# Patient Record
Sex: Female | Born: 1950 | Race: Black or African American | Hispanic: No | Marital: Single | Smoking: Never smoker
Health system: Southern US, Community
[De-identification: ages and names within clinical notes are randomized; demographics above are authoritative.]

## PROBLEM LIST (undated history)

## (undated) DIAGNOSIS — I10 Essential (primary) hypertension: Secondary | ICD-10-CM

## (undated) DIAGNOSIS — D649 Anemia, unspecified: Secondary | ICD-10-CM

## (undated) DIAGNOSIS — I429 Cardiomyopathy, unspecified: Secondary | ICD-10-CM

## (undated) DIAGNOSIS — I2510 Atherosclerotic heart disease of native coronary artery without angina pectoris: Secondary | ICD-10-CM

## (undated) DIAGNOSIS — R569 Unspecified convulsions: Secondary | ICD-10-CM

## (undated) DIAGNOSIS — J9690 Respiratory failure, unspecified, unspecified whether with hypoxia or hypercapnia: Secondary | ICD-10-CM

## (undated) DIAGNOSIS — H547 Unspecified visual loss: Secondary | ICD-10-CM

## (undated) DIAGNOSIS — F329 Major depressive disorder, single episode, unspecified: Secondary | ICD-10-CM

## (undated) DIAGNOSIS — G8190 Hemiplegia, unspecified affecting unspecified side: Secondary | ICD-10-CM

## (undated) DIAGNOSIS — F32A Depression, unspecified: Secondary | ICD-10-CM

## (undated) DIAGNOSIS — G360 Neuromyelitis optica [Devic]: Secondary | ICD-10-CM

## (undated) DIAGNOSIS — Z9911 Dependence on respirator [ventilator] status: Secondary | ICD-10-CM

## (undated) HISTORY — PX: TRACHEOSTOMY: SUR1362

---

## 2013-09-12 ENCOUNTER — Emergency Department (HOSPITAL_COMMUNITY): Payer: Medicare Other

## 2013-09-12 ENCOUNTER — Encounter (HOSPITAL_COMMUNITY): Payer: Self-pay | Admitting: Emergency Medicine

## 2013-09-12 ENCOUNTER — Inpatient Hospital Stay (HOSPITAL_COMMUNITY): Payer: Medicare Other

## 2013-09-12 ENCOUNTER — Inpatient Hospital Stay (HOSPITAL_COMMUNITY)
Admission: EM | Admit: 2013-09-12 | Discharge: 2013-09-17 | DRG: 853 | Disposition: A | Discharge status: Other Acute Inpatient Hospital | Payer: Medicare Other | Attending: Internal Medicine | Admitting: Internal Medicine

## 2013-09-12 DIAGNOSIS — F411 Generalized anxiety disorder: Secondary | ICD-10-CM | POA: Diagnosis present

## 2013-09-12 DIAGNOSIS — I69959 Hemiplegia and hemiparesis following unspecified cerebrovascular disease affecting unspecified side: Secondary | ICD-10-CM

## 2013-09-12 DIAGNOSIS — K921 Melena: Secondary | ICD-10-CM

## 2013-09-12 DIAGNOSIS — F329 Major depressive disorder, single episode, unspecified: Secondary | ICD-10-CM | POA: Diagnosis present

## 2013-09-12 DIAGNOSIS — Z1635 Resistance to multiple antimicrobial drugs: Secondary | ICD-10-CM

## 2013-09-12 DIAGNOSIS — D638 Anemia in other chronic diseases classified elsewhere: Secondary | ICD-10-CM | POA: Diagnosis present

## 2013-09-12 DIAGNOSIS — L8991 Pressure ulcer of unspecified site, stage 1: Secondary | ICD-10-CM | POA: Diagnosis present

## 2013-09-12 DIAGNOSIS — N390 Urinary tract infection, site not specified: Secondary | ICD-10-CM | POA: Diagnosis present

## 2013-09-12 DIAGNOSIS — N184 Chronic kidney disease, stage 4 (severe): Secondary | ICD-10-CM | POA: Diagnosis present

## 2013-09-12 DIAGNOSIS — E872 Acidosis, unspecified: Secondary | ICD-10-CM | POA: Diagnosis present

## 2013-09-12 DIAGNOSIS — N189 Chronic kidney disease, unspecified: Secondary | ICD-10-CM

## 2013-09-12 DIAGNOSIS — N201 Calculus of ureter: Secondary | ICD-10-CM | POA: Diagnosis present

## 2013-09-12 DIAGNOSIS — Z841 Family history of disorders of kidney and ureter: Secondary | ICD-10-CM

## 2013-09-12 DIAGNOSIS — F3289 Other specified depressive episodes: Secondary | ICD-10-CM | POA: Diagnosis present

## 2013-09-12 DIAGNOSIS — M7989 Other specified soft tissue disorders: Secondary | ICD-10-CM | POA: Diagnosis present

## 2013-09-12 DIAGNOSIS — I129 Hypertensive chronic kidney disease with stage 1 through stage 4 chronic kidney disease, or unspecified chronic kidney disease: Secondary | ICD-10-CM | POA: Diagnosis present

## 2013-09-12 DIAGNOSIS — N139 Obstructive and reflux uropathy, unspecified: Secondary | ICD-10-CM | POA: Diagnosis present

## 2013-09-12 DIAGNOSIS — Z9911 Dependence on respirator [ventilator] status: Secondary | ICD-10-CM

## 2013-09-12 DIAGNOSIS — H543 Unqualified visual loss, both eyes: Secondary | ICD-10-CM | POA: Diagnosis present

## 2013-09-12 DIAGNOSIS — N200 Calculus of kidney: Secondary | ICD-10-CM | POA: Diagnosis present

## 2013-09-12 DIAGNOSIS — B965 Pseudomonas (aeruginosa) (mallei) (pseudomallei) as the cause of diseases classified elsewhere: Secondary | ICD-10-CM | POA: Diagnosis present

## 2013-09-12 DIAGNOSIS — IMO0002 Reserved for concepts with insufficient information to code with codable children: Secondary | ICD-10-CM | POA: Diagnosis present

## 2013-09-12 DIAGNOSIS — R569 Unspecified convulsions: Secondary | ICD-10-CM | POA: Diagnosis present

## 2013-09-12 DIAGNOSIS — M2450 Contracture, unspecified joint: Secondary | ICD-10-CM | POA: Diagnosis present

## 2013-09-12 DIAGNOSIS — E669 Obesity, unspecified: Secondary | ICD-10-CM | POA: Diagnosis present

## 2013-09-12 DIAGNOSIS — Z888 Allergy status to other drugs, medicaments and biological substances status: Secondary | ICD-10-CM

## 2013-09-12 DIAGNOSIS — Z6826 Body mass index (BMI) 26.0-26.9, adult: Secondary | ICD-10-CM | POA: Diagnosis not present

## 2013-09-12 DIAGNOSIS — N179 Acute kidney failure, unspecified: Secondary | ICD-10-CM

## 2013-09-12 DIAGNOSIS — G360 Neuromyelitis optica [Devic]: Secondary | ICD-10-CM | POA: Diagnosis present

## 2013-09-12 DIAGNOSIS — J189 Pneumonia, unspecified organism: Secondary | ICD-10-CM

## 2013-09-12 DIAGNOSIS — Z833 Family history of diabetes mellitus: Secondary | ICD-10-CM

## 2013-09-12 DIAGNOSIS — G35 Multiple sclerosis: Secondary | ICD-10-CM | POA: Diagnosis present

## 2013-09-12 DIAGNOSIS — Z930 Tracheostomy status: Secondary | ICD-10-CM

## 2013-09-12 DIAGNOSIS — A419 Sepsis, unspecified organism: Principal | ICD-10-CM | POA: Diagnosis present

## 2013-09-12 DIAGNOSIS — J9620 Acute and chronic respiratory failure, unspecified whether with hypoxia or hypercapnia: Secondary | ICD-10-CM

## 2013-09-12 DIAGNOSIS — I2510 Atherosclerotic heart disease of native coronary artery without angina pectoris: Secondary | ICD-10-CM | POA: Diagnosis present

## 2013-09-12 DIAGNOSIS — N1330 Unspecified hydronephrosis: Secondary | ICD-10-CM

## 2013-09-12 DIAGNOSIS — I69998 Other sequelae following unspecified cerebrovascular disease: Secondary | ICD-10-CM

## 2013-09-12 DIAGNOSIS — D6489 Other specified anemias: Secondary | ICD-10-CM

## 2013-09-12 DIAGNOSIS — R6520 Severe sepsis without septic shock: Secondary | ICD-10-CM

## 2013-09-12 DIAGNOSIS — E8779 Other fluid overload: Secondary | ICD-10-CM | POA: Diagnosis not present

## 2013-09-12 DIAGNOSIS — L89109 Pressure ulcer of unspecified part of back, unspecified stage: Secondary | ICD-10-CM | POA: Diagnosis present

## 2013-09-12 DIAGNOSIS — I428 Other cardiomyopathies: Secondary | ICD-10-CM | POA: Diagnosis present

## 2013-09-12 DIAGNOSIS — J9621 Acute and chronic respiratory failure with hypoxia: Secondary | ICD-10-CM

## 2013-09-12 DIAGNOSIS — H109 Unspecified conjunctivitis: Secondary | ICD-10-CM | POA: Diagnosis not present

## 2013-09-12 HISTORY — DX: Depression, unspecified: F32.A

## 2013-09-12 HISTORY — DX: Essential (primary) hypertension: I10

## 2013-09-12 HISTORY — DX: Cardiomyopathy, unspecified: I42.9

## 2013-09-12 HISTORY — DX: Hemiplegia, unspecified affecting unspecified side: G81.90

## 2013-09-12 HISTORY — DX: Respiratory failure, unspecified, unspecified whether with hypoxia or hypercapnia: J96.90

## 2013-09-12 HISTORY — DX: Atherosclerotic heart disease of native coronary artery without angina pectoris: I25.10

## 2013-09-12 HISTORY — DX: Dependence on respirator (ventilator) status: Z99.11

## 2013-09-12 HISTORY — DX: Anemia, unspecified: D64.9

## 2013-09-12 HISTORY — DX: Major depressive disorder, single episode, unspecified: F32.9

## 2013-09-12 HISTORY — DX: Neuromyelitis optica (devic): G36.0

## 2013-09-12 HISTORY — DX: Unspecified convulsions: R56.9

## 2013-09-12 HISTORY — DX: Unspecified visual loss: H54.7

## 2013-09-12 LAB — CBC WITH DIFFERENTIAL/PLATELET
BASOS ABS: 0.1 10*3/uL (ref 0.0–0.1)
Basophils Relative: 1 % (ref 0–1)
EOS ABS: 0.5 10*3/uL (ref 0.0–0.7)
EOS PCT: 7 % — AB (ref 0–5)
HCT: 22.2 % — ABNORMAL LOW (ref 36.0–46.0)
Hemoglobin: 7.3 g/dL — ABNORMAL LOW (ref 12.0–15.0)
LYMPHS PCT: 17 % (ref 12–46)
Lymphs Abs: 1.2 10*3/uL (ref 0.7–4.0)
MCH: 31.2 pg (ref 26.0–34.0)
MCHC: 32.9 g/dL (ref 30.0–36.0)
MCV: 94.9 fL (ref 78.0–100.0)
Monocytes Absolute: 0.3 10*3/uL (ref 0.1–1.0)
Monocytes Relative: 4 % (ref 3–12)
NEUTROS PCT: 71 % (ref 43–77)
Neutro Abs: 4.9 10*3/uL (ref 1.7–7.7)
PLATELETS: 154 10*3/uL (ref 150–400)
RBC: 2.34 MIL/uL — ABNORMAL LOW (ref 3.87–5.11)
RDW: 15.3 % (ref 11.5–15.5)
WBC: 6.9 10*3/uL (ref 4.0–10.5)

## 2013-09-12 LAB — COMPREHENSIVE METABOLIC PANEL
ALT: 7 U/L (ref 0–35)
AST: 11 U/L (ref 0–37)
Albumin: 1.9 g/dL — ABNORMAL LOW (ref 3.5–5.2)
Alkaline Phosphatase: 85 U/L (ref 39–117)
BUN: 47 mg/dL — ABNORMAL HIGH (ref 6–23)
CALCIUM: 7.7 mg/dL — AB (ref 8.4–10.5)
CHLORIDE: 111 meq/L (ref 96–112)
Creatinine, Ser: 4.15 mg/dL — ABNORMAL HIGH (ref 0.50–1.10)
GFR calc Af Amer: 12 mL/min — ABNORMAL LOW (ref >90)
GFR, EST NON AFRICAN AMERICAN: 11 mL/min — AB (ref >90)
Glucose, Bld: 78 mg/dL (ref 70–99)
Potassium: 4.1 mEq/L (ref 3.7–5.3)
SODIUM: 136 meq/L — AB (ref 137–147)
Total Bilirubin: <0.2 mg/dL — ABNORMAL LOW (ref 0.3–1.2)
Total Protein: 5.5 g/dL — ABNORMAL LOW (ref 6.0–8.3)

## 2013-09-12 LAB — I-STAT ARTERIAL BLOOD GAS, ED
Acid-base deficit: 16 mmol/L — ABNORMAL HIGH (ref 0.0–2.0)
Bicarbonate: 10.8 mEq/L — ABNORMAL LOW (ref 20.0–24.0)
O2 Saturation: 99 %
PO2 ART: 133 mmHg — AB (ref 80.0–100.0)
Patient temperature: 96.3
TCO2: 12 mmol/L (ref 0–100)
pCO2 arterial: 26.4 mmHg — ABNORMAL LOW (ref 35.0–45.0)
pH, Arterial: 7.213 — ABNORMAL LOW (ref 7.350–7.450)

## 2013-09-12 LAB — URINALYSIS, ROUTINE W REFLEX MICROSCOPIC
Bilirubin Urine: NEGATIVE
GLUCOSE, UA: NEGATIVE mg/dL
Hgb urine dipstick: NEGATIVE
KETONES UR: NEGATIVE mg/dL
Nitrite: POSITIVE — AB
Protein, ur: >300 mg/dL — AB
Specific Gravity, Urine: 1.019 (ref 1.005–1.030)
Urobilinogen, UA: 0.2 mg/dL (ref 0.0–1.0)
pH: 5.5 (ref 5.0–8.0)

## 2013-09-12 LAB — URINE MICROSCOPIC-ADD ON

## 2013-09-12 LAB — TSH: TSH: 2.64 u[IU]/mL (ref 0.350–4.500)

## 2013-09-12 LAB — I-STAT CG4 LACTIC ACID, ED: Lactic Acid, Venous: <0.3 mmol/L — ABNORMAL LOW (ref 0.5–2.2)

## 2013-09-12 LAB — PREPARE RBC (CROSSMATCH)

## 2013-09-12 LAB — POC OCCULT BLOOD, ED: Fecal Occult Bld: POSITIVE — AB

## 2013-09-12 MED ORDER — ALBUTEROL SULFATE (2.5 MG/3ML) 0.083% IN NEBU: 2.5000 mg | INHALATION_SOLUTION | RESPIRATORY_TRACT | Status: DC | PRN

## 2013-09-12 MED ORDER — SODIUM BICARBONATE 8.4 % IV SOLN
50.0000 meq | Freq: Once | INTRAVENOUS | Status: AC
  Administered 2013-09-12: 50 meq via INTRAVENOUS
  Filled 2013-09-12: qty 50

## 2013-09-12 MED ORDER — PANTOPRAZOLE SODIUM 40 MG IV SOLR
40.0000 mg | Freq: Every day | INTRAVENOUS | Status: DC
  Administered 2013-09-13: 40 mg via INTRAVENOUS
  Filled 2013-09-12 (×2): qty 40

## 2013-09-12 MED ORDER — CEFTRIAXONE SODIUM 1 G IJ SOLR
1.0000 g | INTRAMUSCULAR | Status: DC
  Administered 2013-09-13 – 2013-09-16 (×4): 1 g via INTRAVENOUS
  Filled 2013-09-12 (×4): qty 10

## 2013-09-12 MED ORDER — SODIUM CHLORIDE 0.9 % IV SOLN
80.0000 mg | Freq: Once | INTRAVENOUS | Status: AC
  Administered 2013-09-12: 80 mg via INTRAVENOUS
  Filled 2013-09-12: qty 80

## 2013-09-12 MED ORDER — HYDRALAZINE HCL 20 MG/ML IJ SOLN
10.0000 mg | INTRAMUSCULAR | Status: DC | PRN
  Administered 2013-09-14 – 2013-09-16 (×5): 10 mg via INTRAVENOUS
  Filled 2013-09-12 (×5): qty 1

## 2013-09-12 MED ORDER — SODIUM CHLORIDE 0.9 % IV SOLN
250.0000 mL | INTRAVENOUS | Status: DC | PRN
  Administered 2013-09-15: 250 mL via INTRAVENOUS

## 2013-09-12 MED ORDER — AMLODIPINE BESYLATE 5 MG PO TABS
5.0000 mg | ORAL_TABLET | Freq: Every day | ORAL | Status: DC
  Administered 2013-09-13: 5 mg via ORAL
  Filled 2013-09-12 (×2): qty 1

## 2013-09-12 MED ORDER — PIPERACILLIN-TAZOBACTAM 3.375 G IVPB 30 MIN
3.3750 g | Freq: Once | INTRAVENOUS | Status: AC
  Administered 2013-09-12: 3.375 g via INTRAVENOUS
  Filled 2013-09-12: qty 50

## 2013-09-12 MED ORDER — HYDRALAZINE HCL 50 MG PO TABS
100.0000 mg | ORAL_TABLET | Freq: Three times a day (TID) | ORAL | Status: DC
  Administered 2013-09-13 – 2013-09-17 (×13): 100 mg via ORAL
  Filled 2013-09-12 (×17): qty 2

## 2013-09-12 MED ORDER — LABETALOL HCL 300 MG PO TABS
300.0000 mg | ORAL_TABLET | Freq: Three times a day (TID) | ORAL | Status: DC
  Administered 2013-09-13 – 2013-09-17 (×13): 300 mg via ORAL
  Filled 2013-09-12 (×17): qty 1

## 2013-09-12 MED ORDER — SODIUM CHLORIDE 0.9 % IV SOLN
10.0000 mL/h | Freq: Once | INTRAVENOUS | Status: AC
  Administered 2013-09-12: 10 mL/h via INTRAVENOUS

## 2013-09-12 MED ORDER — SODIUM CHLORIDE 0.9 % IV SOLN
2000.0000 mg | Freq: Once | INTRAVENOUS | Status: AC
  Administered 2013-09-12: 2000 mg via INTRAVENOUS
  Filled 2013-09-12: qty 2000

## 2013-09-12 MED ORDER — ONDANSETRON HCL 4 MG/2ML IJ SOLN
4.0000 mg | Freq: Four times a day (QID) | INTRAMUSCULAR | Status: DC | PRN
  Administered 2013-09-14 – 2013-09-17 (×4): 4 mg via INTRAVENOUS
  Filled 2013-09-12 (×5): qty 2

## 2013-09-12 MED ORDER — SODIUM CHLORIDE 0.9 % IV BOLUS (SEPSIS)
1000.0000 mL | Freq: Once | INTRAVENOUS | Status: AC
  Administered 2013-09-12: 1000 mL via INTRAVENOUS

## 2013-09-12 MED ORDER — DOCUSATE SODIUM 100 MG PO CAPS
100.0000 mg | ORAL_CAPSULE | Freq: Two times a day (BID) | ORAL | Status: DC
  Administered 2013-09-13 – 2013-09-17 (×7): 100 mg via ORAL
  Filled 2013-09-12 (×11): qty 1

## 2013-09-12 MED ORDER — TIZANIDINE HCL 2 MG PO TABS
2.0000 mg | ORAL_TABLET | Freq: Three times a day (TID) | ORAL | Status: DC
  Administered 2013-09-13 – 2013-09-17 (×14): 2 mg via ORAL
  Filled 2013-09-12 (×17): qty 1

## 2013-09-12 MED ORDER — SODIUM BICARBONATE 8.4 % IV SOLN
INTRAVENOUS | Status: DC
  Administered 2013-09-12 – 2013-09-14 (×4): via INTRAVENOUS
  Filled 2013-09-12 (×5): qty 150

## 2013-09-12 NOTE — Consult Note (Addendum)
[name] is an 63 y.o. 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  Last labs that are available form 5/15 show Scr 1.6 and bicarb 23.  Med list not not reveal any meds that are nephrotoxic.  She had a foley in place until 1 month ago.  She says she makes urine.  Past Medical History  Diagnosis Date  . Neuromyelitis optica   . Blindness   . Hemiparesis     left sided  . Hypertension   . Cardiomyopathy   . Coronary artery disease   . Ventilator dependent   . Respiratory failure   . Depression   . Anemia   . Seizures     Past Surgical History  Procedure Laterality Date  . Tracheostomy      No family history on file. FH + for ESRD in mother sec DM Social History:  reports that she has never smoked. She does not have any smokeless tobacco history on file. She reports that she does not drink alcohol or use illicit drugs. Long term resident of [facility]  Allergies:  Allergies  Allergen Reactions  . Neurontin [Gabapentin] Other (See Comments)    Per MAR, unknown  . Xanax [Alprazolam] Other (See Comments)    Per MAR, unknown     (Not in a hospital admission)   Lab Results: UA: > 300 protein, 21-50 wbc's, 0-2 rbc many bacteria   Recent Labs  09/12/13 1750  WBC 6.9  HGB 7.3*  HCT 22.2*  PLT 154   BMET  Recent Labs  09/12/13 1750  NA 136*  K 4.1  CL 111  CO2 <7*  GLUCOSE 78  BUN 47*  CREATININE 4.15*  CALCIUM 7.7*   LFT  Recent Labs  09/12/13 1750  PROT 5.5*  ALBUMIN 1.9*  AST 11  ALT 7  ALKPHOS 85  BILITOT <0.2*   Dg Chest Portable 1 View  09/12/2013   CLINICAL DATA:  PICC placement.  EXAM: PORTABLE CHEST - 1 VIEW  COMPARISON:  12/02/2011.  FINDINGS: LEFT lower lobe atelectasis, effusion, and possible consolidation. Tracheostomy.  Cardiomegaly.  The PICC line from RIGHT arm approach has its tip at the cavoatrial junction in good position. No pneumothorax. RIGHT lung clear.  Worsening aeration from priors.  IMPRESSION: PICC line from RIGHT arm approach lies with its tip at the cavoatrial junction. LEFT lower lobe process as described.   Electronically Signed   By: Davonna Belling M.D.   On: 09/12/2013 17:14    ROS: Pt trached but can nod yes or no Blind No SOB No CP No dysuria No abd pain She does eat and appetite has been fair   PHYSICAL EXAM: Blood pressure 180/78, pulse 69, temperature 99.6 F (37.6 C), temperature source Temporal, resp. rate 24, SpO2 100.00%. HEENT: Blind. Purulent drainage Rt eye.  Trach with indurated area around trach NECK:No JVD LUNGS:Decreased BS Lt base CARDIAC:RRR 2/6 systolic M LSB/apex ABD:+ BS NT + suprapubic fullness EXT:1-2+ edema of legs and arms.  PICC line Rt arm NEURO:Arms are contracted with Lt hemiparesis.  She can raise her legs off the bed. Follows commands. No asterixis  Assessment: 1. Acute on CKD 3 ? Partial urinary obstruction 2. Metabolic acidosis, high AG and non AG 3. VDRF 4. Neuromyelitis  Optica 5. Anemia 6. Hx lt psoas abscess 7. ? UTI  PLAN: 1. Bicarb gtt 2. Foley cath 3. Renal US 4. Check PO4, Pr/Cr 5. Resume aranep 6.  Discussed with pt and family that if renal fx were to worsen that HD should not be done and we should treat what we can treat medically.  They agree. 7. DAily Scr 8 Empiric AB pending cultures   [name] T 09/12/2013, 9:57 PM

## 2013-09-12 NOTE — H&P (Signed)
PULMONARY / CRITICAL CARE MEDICINE   Name: [name] MRN: [id number] DOB: August 09, 1950    ADMISSION DATE:  09/12/2013  REFERRING MD :  Dr. Silverio Lay in ED  CHIEF COMPLAINT:  Abnormal labs  INITIAL PRESENTATION:   STUDIES:    SIGNIFICANT EVENTS:    HISTORY OF PRESENT ILLNESS:  This is a complicated 63 year old AAF with PMH of neuromyelitis optica with blindness hemiparesis on the left side, HTN, cardiomyopathy, CAD, anemia of chronic illness, anxiety and depression, and tracheostomy w/ vent dependence. She lives in [facility] for the past 4 years. She was sent here due to abnormal labs reviewed patient had a bump in her creatinine up to 4.5 (last known 1.6 on MAy 2015). She also had decreased CO2 levels at 10. Hemoglobin 7.5 with positive stool guaiac. Apparently she also has generalized edema and was sent here for evaluation. In the ED she has findings consistent with UTI, also has a renal failure with severe metabolic acidosis with resp compensation. She wears a diaper usually. Also known to have sacral decubitus ulcer   PAST MEDICAL HISTORY :  Past Medical History  Diagnosis Date  . Neuromyelitis optica   . Blindness   . Hemiparesis     left sided  . Hypertension   . Cardiomyopathy   . Coronary artery disease   . Ventilator dependent   . Respiratory failure   . Depression   . Anemia   . Seizures    Past Surgical History  Procedure Laterality Date  . Tracheostomy     Prior to Admission medications   Medication Sig Start Date End Date Taking? Authorizing Provider  acetaminophen (TYLENOL) 325 MG tablet Take 650 mg by mouth every 6 (six) hours as needed for moderate pain.   Yes Historical Provider, MD  amLODipine (NORVASC) 10 MG tablet Take 10 mg by mouth daily.   Yes Historical Provider, MD  chlorhexidine (PERIDEX) 0.12 % solution Use as directed 15 mLs in the mouth or throat 2 (two) times daily.   Yes Historical Provider, MD  cloNIDine (CATAPRES) 0.1 MG tablet Take 0.1 mg by  mouth every 6 (six) hours as needed (for hypertension for systolic blood pressure greater than 160).   Yes Historical Provider, MD  darbepoetin (ARANESP) 40 MCG/0.4ML SOLN injection Inject 40 mcg into the skin every 7 (seven) days. On Monday   Yes Historical Provider, MD  DULoxetine (CYMBALTA) 30 MG capsule Take 30 mg by mouth daily.   Yes Historical Provider, MD  ferrous sulfate 325 (65 FE) MG tablet Take 325 mg by mouth 2 (two) times daily.   Yes Historical Provider, MD  hydrALAZINE (APRESOLINE) 100 MG tablet Take 100 mg by mouth 3 (three) times daily.   Yes Historical Provider, MD  labetalol (NORMODYNE) 300 MG tablet Take 300 mg by mouth 3 (three) times daily.   Yes Historical Provider, MD  levETIRAcetam (KEPPRA) 250 MG tablet Take 250 mg by mouth at bedtime.   Yes Historical Provider, MD  levETIRAcetam (KEPPRA) 500 MG tablet Take 500 mg by mouth daily.   Yes Historical Provider, MD  levofloxacin (LEVAQUIN) 500 MG/100ML SOLN Inject 500 mg into the vein every other day. 09/02/13  Yes Historical Provider, MD  magnesium hydroxide (MILK OF MAGNESIA) 400 MG/5ML suspension Take 30 mLs by mouth every 8 (eight) hours as needed (for nausea or vomiting).   Yes Historical Provider, MD  Multiple Vitamins-Minerals (MULTIVITAMIN & MINERAL PO) Take 1 tablet by mouth daily.   Yes Historical Provider, MD  mupirocin ointment (  BACTROBAN) 2 % Place 1 application into the nose as needed (for right toe infection).   Yes Historical Provider, MD  omeprazole (PRILOSEC) 20 MG capsule Take 20 mg by mouth daily.   Yes Historical Provider, MD  promethazine (PHENERGAN) 25 MG tablet Take 12.5 mg by mouth every 6 (six) hours as needed for nausea or vomiting.   Yes Historical Provider, MD  simethicone (MYLICON) 80 MG chewable tablet Chew 80 mg by mouth every 6 (six) hours as needed for flatulence.   Yes Historical Provider, MD  tiZANidine (ZANAFLEX) 2 MG tablet Take 2 mg by mouth 3 (three) times daily.   Yes Historical Provider, MD    Allergies  Allergen Reactions  . Neurontin [Gabapentin] Other (See Comments)    Per MAR, unknown  . Xanax [Alprazolam] Other (See Comments)    Per MAR, unknown    FAMILY HISTORY:  No family history on file. SOCIAL HISTORY:  reports that she has never smoked. She does not have any smokeless tobacco history on file. She reports that she does not drink alcohol or use illicit drugs.  REVIEW OF SYSTEMS:  Has nausea. She is blind and with contractures. No chest pain, no dyspnea, no abd pain, no diarrhea, no fever, no headaches, no new rash, no neck pain.  SUBJECTIVE:   VITAL SIGNS: Temp:  [96.3 F (35.7 C)-99.6 F (37.6 C)] 99.6 F (37.6 C) (08/08 1839) Pulse Rate:  [70-97] 72 (08/08 2100) Resp:  [22-24] 24 (08/08 1940) BP: (143-171)/(60-77) 171/60 mmHg (08/08 2100) SpO2:  [100 %] 100 % (08/08 2100) FiO2 (%):  [30 %] 30 % (08/08 1940) HEMODYNAMICS:   VENTILATOR SETTINGS: Vent Mode:  [-] PRVC FiO2 (%):  [30 %] 30 % Set Rate:  [22 bmp] 22 bmp Vt Set:  [500 mL] 500 mL PEEP:  [5 cmH20] 5 cmH20 Plateau Pressure:  [19 cmH20-24 cmH20] 24 cmH20 INTAKE / OUTPUT:  Intake/Output Summary (Last 24 hours) at 09/12/13 2131 Last data filed at 09/12/13 2104  Gross per 24 hour  Intake    100 ml  Output      0 ml  Net    100 ml    PHYSICAL EXAMINATION: General:  Awake, alert, oriented, mouthing off words appropirately Neuro:  Able to move her legs but restricted due to contractures, no facial droop, tongue midline HEENT:  Atraumatic, trache site looks clean, has chronic skin changes around the trache, eyes open Cardiovascular:  RRR, 3/6 systolic murmur Lungs:  Fair air movement, no wheeze, mild rhonchi Abdomen: soft, nontender, no guarding Musculoskeletal:  Has flexion contractures on both arms, has extensor contractures on both legs, mild edema on her arms, mild edema on the face Skin:  (+) sacral decubitus ulcer St. 1  LABS:  CBC  Recent Labs Lab 09/12/13 1750  WBC 6.9   HGB 7.3*  HCT 22.2*  PLT 154   Coag's No results found for this basename: APTT, INR,  in the last 168 hours BMET  Recent Labs Lab 09/12/13 1750  NA 136*  K 4.1  CL 111  CO2 <7*  BUN 47*  CREATININE 4.15*  GLUCOSE 78   Electrolytes  Recent Labs Lab 09/12/13 1750  CALCIUM 7.7*   Sepsis Markers  Recent Labs Lab 09/12/13 1758  LATICACIDVEN <0.30*   ABG  Recent Labs Lab 09/12/13 1654  PHART 7.213*  PCO2ART 26.4*  PO2ART 133.0*   Liver Enzymes  Recent Labs Lab 09/12/13 1750  AST 11  ALT 7  ALKPHOS 85  BILITOT <  0.2*  ALBUMIN 1.9*   Cardiac Enzymes No results found for this basename: TROPONINI, PROBNP,  in the last 168 hours Glucose No results found for this basename: GLUCAP,  in the last 168 hours  Imaging No results found. CXR 8/8: LEFT lower lobe atelectasis, effusion, and possible consolidation. Tracheostomy. Cardiomegaly.  The PICC line from RIGHT arm approach has its tip at the cavoatrial junction in good position. No pneumothorax. RIGHT lung clear. Worsening aeration from priors.   ASSESSMENT / PLAN:  PULMONARY A: chronic trache + vent dependence, possible atelectasis LLL P:   The LLL opacity may very well be simple atelectasis, no clinical signs of pneumonia. May be some fluid as well especially if she is fluid overloaded from the renal failure Continue vent support, bronchial hygiene, elevate head of bed Turn side to side to avoid worsening sacral ulcer  CARDIOVASCULAR A: HTN, CAD, hx of cardiomyopathy P:  Continue BP meds  RENAL A:  Acute renal failure of unclear etiology, differentials include: UTI/sepsis related, obstruction, drug reaction/interstitial nephritis P:   Give IVF for now with bicarb, renal + bladder US, urine eosinophil, nephrology consult Does not appear to need dialysis at the moment Stop non-essential meds Strict I/O  GASTROINTESTINAL A:  No active issue P:   Swallow eval and feed if  possible  HEMATOLOGIC A:  Anemia of chronic disease, also has positive stool guaiac P:  Continue darbopoietin No heparin for now until we are sure there is GI bleed, monitor Hb  INFECTIOUS A:  Possible UTI. Much less likely pneumonia P:   BCx2 pending UC pending Sputum none Abx: Got zosyn + vanco in ED x 1 Will give her Rocephin daily for now  ENDOCRINE A:  No active issue P:   Monitor blood sugar  NEUROLOGIC A:  Known neuromyelitis optica P:   Vent support  TODAY'S SUMMARY: admitted for acute renal failure with metabolic acidosis, possible UTI. LLL opacity clinically NOT behaving like pneumonia. Nephrology on board.  I have personally obtained a history, examined the patient, evaluated laboratory and imaging results, formulated the assessment and plan and placed orders. CRITICAL CARE: The patient is critically ill with multiple organ systems failure and requires high complexity decision making for assessment and support, frequent evaluation and titration of therapies, application of advanced monitoring technologies and extensive interpretation of multiple databases. Critical Care Time devoted to patient care services described in this note is 45 minutes.    Pulmonary and Critical Care Medicine [facility] Pager: [phone]  09/12/2013, 9:31 PM

## 2013-09-12 NOTE — ED Notes (Signed)
Attempted to insert foley x2, unsuccessful. MD aware

## 2013-09-12 NOTE — ED Notes (Signed)
Pt temp recorded 99.6, pt taken off of Bair Hugger warming device.

## 2013-09-12 NOTE — ED Notes (Signed)
Pt is from [facility] and is chronically ventilated via trach. Pt is alert, awake, and can mouth words. Pt is here with abnormal labs:  Creatinine 4.5, hgb 7.5 with history of gi bleed, and CO2 10.  RT at bedside managing vent and settings. Pt is contracted in all 4 limbs.  Pt denied pain.  EMS reports at scene patient was refusing transport but family convinced patient to come.

## 2013-09-12 NOTE — ED Provider Notes (Signed)
CSN: [id number]     Arrival date & time 09/12/13  1614 History   First MD Initiated Contact with Patient 09/12/13 1627     Chief Complaint  Patient presents with  . Abnormal Lab    Creat 4.5, co2 10, hgb 7.5  . 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 She presents today from [facility] where she is a resident for abnormal labs (creatinine up to 4.5). She also had decreased HCO3 levels at 10. Hemoglobin 7.5. She was found to have generalized edema and was sent here for evaluation. Of note, pt is on Levaquin for PNA (began on 7/29.)  (Consider location/radiation/quality/duration/timing/severity/associated sxs/prior Treatment) Patient is a 63 y.o. female presenting with general illness.  Illness Location:  Generalized edema, abnormal labs Severity:  Moderate Onset quality:  Sudden Timing:  Constant Progression:  Worsening Chronicity:  New Associated symptoms: no abdominal pain, no chest pain, no diarrhea, no fever, no headaches, no nausea, no shortness of breath and no vomiting     Past Medical History  Diagnosis Date  . Neuromyelitis optica   . Blindness   . Hemiparesis     left sided  . Hypertension   . Cardiomyopathy   . Coronary artery disease   . Ventilator dependent   . Respiratory failure   . Depression   . Anemia   . Seizures    Past Surgical History  Procedure Laterality Date  . Tracheostomy     No family history on file. History  Substance Use Topics  . Smoking status: Never Smoker   . Smokeless tobacco: Not on file  . Alcohol Use: No   OB History   Grav Para Term Preterm Abortions TAB SAB Ect Mult Living                 Review of Systems  Constitutional: Negative for fever and chills.  Respiratory:  Negative for shortness of breath.   Cardiovascular: Negative for chest pain, palpitations and leg swelling.  Gastrointestinal: Negative for nausea, vomiting, abdominal pain, diarrhea, constipation and abdominal distention.  Genitourinary: Negative for dysuria, frequency, flank pain and decreased urine volume.  Neurological: Negative for dizziness, speech difficulty, light-headedness and headaches.  All other systems reviewed and are negative.     Allergies  Neurontin and Xanax  Home Medications   Prior to Admission medications   Medication Sig Start Date End Date Taking? Authorizing Provider  acetaminophen (TYLENOL) 325 MG tablet Take 650 mg by mouth every 6 (six) hours as needed for moderate pain.   Yes Historical Provider, MD  amLODipine (NORVASC) 10 MG tablet Take 10 mg by mouth daily.   Yes Historical Provider, MD  chlorhexidine (PERIDEX) 0.12 % solution Use as directed 15 mLs in the mouth or throat 2 (two) times daily.   Yes Historical Provider, MD  cloNIDine (CATAPRES) 0.1 MG tablet Take 0.1 mg by mouth every 6 (six) hours as needed (for hypertension for systolic blood pressure greater than 160).   Yes Historical Provider, MD  darbepoetin (ARANESP) 40 MCG/0.4ML SOLN injection Inject 40 mcg into the skin every 7 (seven) days. On Monday   Yes Historical Provider, MD  DULoxetine (CYMBALTA) 30 MG capsule Take 30 mg by mouth daily.   Yes Historical Provider, MD  ferrous sulfate  325 (65 FE) MG tablet Take 325 mg by mouth 2 (two) times daily.   Yes Historical Provider, MD  hydrALAZINE (APRESOLINE) 100 MG tablet Take 100 mg by mouth 3 (three) times daily.   Yes Historical Provider, MD  labetalol (NORMODYNE) 300 MG tablet Take 300 mg by mouth 3 (three) times daily.   Yes Historical Provider, MD  levETIRAcetam (KEPPRA) 250 MG tablet Take 250 mg by mouth at bedtime.   Yes Historical Provider, MD  levETIRAcetam (KEPPRA) 500 MG tablet Take 500 mg by mouth daily.   Yes Historical Provider, MD   levofloxacin (LEVAQUIN) 500 MG/100ML SOLN Inject 500 mg into the vein every other day. 09/02/13  Yes Historical Provider, MD  magnesium hydroxide (MILK OF MAGNESIA) 400 MG/5ML suspension Take 30 mLs by mouth every 8 (eight) hours as needed (for nausea or vomiting).   Yes Historical Provider, MD  Multiple Vitamins-Minerals (MULTIVITAMIN & MINERAL PO) Take 1 tablet by mouth daily.   Yes Historical Provider, MD  mupirocin ointment (BACTROBAN) 2 % Place 1 application into the nose as needed (for right toe infection).   Yes Historical Provider, MD  omeprazole (PRILOSEC) 20 MG capsule Take 20 mg by mouth daily.   Yes Historical Provider, MD  promethazine (PHENERGAN) 25 MG tablet Take 12.5 mg by mouth every 6 (six) hours as needed for nausea or vomiting.   Yes Historical Provider, MD  simethicone (MYLICON) 80 MG chewable tablet Chew 80 mg by mouth every 6 (six) hours as needed for flatulence.   Yes Historical Provider, MD  tiZANidine (ZANAFLEX) 2 MG tablet Take 2 mg by mouth 3 (three) times daily.   Yes Historical Provider, MD   BP 151/63  Pulse 93  Temp(Src) 96.3 F (35.7 C) (Rectal)  Resp 22  SpO2 100% Physical Exam  Nursing note and vitals reviewed. Constitutional: She appears well-developed and well-nourished. No distress.  HENT:  Head: Normocephalic and atraumatic.  Cardiovascular: Normal rate, regular rhythm, normal heart sounds and intact distal pulses.  Exam reveals no gallop and no friction rub.   No murmur heard. Pulmonary/Chest: No respiratory distress. She has no wheezes. She has no rales. She exhibits no tenderness.  Rhonchi on right  Abdominal: Soft. Bowel sounds are normal. She exhibits no distension and no mass. There is no tenderness. There is no rebound and no guarding.  Genitourinary: Guaiac positive stool.  Musculoskeletal: She exhibits edema (generaglized edema, 1-2+).  Lymphadenopathy:    She has no cervical adenopathy.  Skin: Skin is warm and dry. She is not diaphoretic.     ED Course  Procedures (including critical care time) Labs Review Labs Reviewed  CBC WITH DIFFERENTIAL - Abnormal; Notable for the following:    RBC 2.34 (*)    Hemoglobin 7.3 (*)    HCT 22.2 (*)    Eosinophils Relative 7 (*)    All other components within normal limits  COMPREHENSIVE METABOLIC PANEL - Abnormal; Notable for the following:    Sodium 136 (*)    CO2 <7 (*)    BUN 47 (*)    Creatinine, Ser 4.15 (*)    Calcium 7.7 (*)    Total Protein 5.5 (*)    Albumin 1.9 (*)    Total Bilirubin <0.2 (*)    GFR calc non Af Amer 11 (*)    GFR calc Af Amer 12 (*)    All other components within normal limits  URINALYSIS, ROUTINE W REFLEX MICROSCOPIC - Abnormal; Notable for the following:    APPearance  CLOUDY (*)    Protein, ur >300 (*)    Nitrite POSITIVE (*)    Leukocytes, UA MODERATE (*)    All other components within normal limits  URINE MICROSCOPIC-ADD ON - Abnormal; Notable for the following:    Squamous Epithelial / LPF FEW (*)    Bacteria, UA MANY (*)    All other components within normal limits  POC OCCULT BLOOD, ED - Abnormal; Notable for the following:    Fecal Occult Bld POSITIVE (*)    All other components within normal limits  I-STAT CG4 LACTIC ACID, ED - Abnormal; Notable for the following:    Lactic Acid, Venous <0.30 (*)    All other components within normal limits  I-STAT ARTERIAL BLOOD GAS, ED - Abnormal; Notable for the following:    pH, Arterial 7.213 (*)    pCO2 arterial 26.4 (*)    pO2, Arterial 133.0 (*)    Bicarbonate 10.8 (*)    Acid-base deficit 16.0 (*)    All other components within normal limits  URINE CULTURE  CULTURE, BLOOD (ROUTINE X 2)  CULTURE, BLOOD (ROUTINE X 2)  MRSA PCR SCREENING  TSH  GLUCOSE, CAPILLARY  BLOOD GAS, ARTERIAL  CBC  BLOOD GAS, ARTERIAL  MAGNESIUM  PHOSPHORUS  COMPREHENSIVE METABOLIC PANEL  PROTEIN / CREATININE RATIO, URINE  PROTEIN ELECTROPHORESIS, SERUM  POC OCCULT BLOOD, ED  TYPE AND SCREEN  PREPARE RBC  (CROSSMATCH)  ABO/RH    Imaging Review US Renal  09/12/2013   CLINICAL DATA:  Acute renal failure, urinary tract infection  EXAM: RENAL/URINARY TRACT ULTRASOUND COMPLETE  COMPARISON:  None.  FINDINGS: Right Kidney:  Length: 11.0 cm. Kidney is mildly to moderately echogenic. There is mild prominence of the intrarenal collecting system and renal pelvis.  Left Kidney:  Length: 10.2 cm. Mild to moderate increased echogenicity. 6 mm upper pole stone. 12 mm midpole stone in the region of the renal pelvis.  Bladder:  Bladder wall thickened to 11 mm  Small volume of ascites in the pelvis.  Left pleural effusion.  IMPRESSION: 1. Medical renal disease 2. Nonobstructing left renal calculi 3. Mild right hydronephrosis 4. Bladder wall thickening appears diffuse and suggests infiltrative abnormality of the bladder wall possibly indicating cystitis.   Electronically Signed   By: Esperanza Heir M.D.   On: 09/12/2013 23:47   Dg Chest Portable 1 View  09/12/2013   CLINICAL DATA:  PICC placement.  EXAM: PORTABLE CHEST - 1 VIEW  COMPARISON:  12/02/2011.  FINDINGS: LEFT lower lobe atelectasis, effusion, and possible consolidation. Tracheostomy. Cardiomegaly.  The PICC line from RIGHT arm approach has its tip at the cavoatrial junction in good position. No pneumothorax. RIGHT lung clear.  Worsening aeration from priors.  IMPRESSION: PICC line from RIGHT arm approach lies with its tip at the cavoatrial junction. LEFT lower lobe process as described.   Electronically Signed   By: Davonna Belling M.D.   On: 09/12/2013 17:14     EKG Interpretation None      MDM   63 year old AAF who is trach dependent presents with generalized edema and elevated creatinine. On exam patient has 1-2+ pitting edema in upper and lower extremities. Satting 100% on her normal vent settings. Rhonchi heard on the right side greater than the left side.   Most recent labs from outside hospital showed her CO2 at 10 and her creatinine at 4.5. Will  obtain an ABG now to correlate as this may be erroneous. Patient's rectal temperature at 96.9. Will also  check thyroid studies. CBC, CMP, blood cultures, lactic acid, and UA pending. Portable chest x-ray ordered. Initially cool temp at 96.9. Bear hugger placed. Temp quickly increased to 99. Therefore, bear huggers removed. TSH wnl.  ABG shows severe metabolic acidosis w/pH 7.23, Bicarb at 10.8. Possible infxn/sepsis. Given 1 amp of bicarb. Started Vanc/Zosyn. IVFs.  CXR shows left lower lobe consolidation. UA shows UTI. Hemoccult +.   Pt w/severe metabolic acidosis, AKI, PNA, and UTI, as well as GI bleed. Consulted ICU. She will be admitted to their service. Please see their note for further details regarding the remainder of her hospital course. Throughout her time in the ED, pt remained stable.    Final diagnoses:  Renal failure (ARF), acute on chronic  Healthcare-associated pneumonia  UTI (lower urinary tract infection)  Acidosis  Gastrointestinal hemorrhage with melena  Anemia due to other cause    Pt was seen under the supervision of Dr. Silverio Lay.     Rachelle Hora, MD 09/13/13 [phone]

## 2013-09-12 NOTE — ED Notes (Signed)
Co2 less than 7. Critical lab value notification. Nurse and MD notified.

## 2013-09-12 NOTE — ED Notes (Signed)
Foley attempted by second set off staff, unable to insert foley catheter

## 2013-09-13 ENCOUNTER — Inpatient Hospital Stay (HOSPITAL_COMMUNITY): Payer: Medicare Other

## 2013-09-13 DIAGNOSIS — E872 Acidosis, unspecified: Secondary | ICD-10-CM

## 2013-09-13 DIAGNOSIS — J9620 Acute and chronic respiratory failure, unspecified whether with hypoxia or hypercapnia: Secondary | ICD-10-CM

## 2013-09-13 DIAGNOSIS — N179 Acute kidney failure, unspecified: Secondary | ICD-10-CM

## 2013-09-13 DIAGNOSIS — J189 Pneumonia, unspecified organism: Secondary | ICD-10-CM

## 2013-09-13 DIAGNOSIS — N189 Chronic kidney disease, unspecified: Secondary | ICD-10-CM

## 2013-09-13 LAB — IRON AND TIBC
Iron: 79 ug/dL (ref 42–135)
Saturation Ratios: 80 % — ABNORMAL HIGH (ref 20–55)
TIBC: 99 ug/dL — ABNORMAL LOW (ref 250–470)
UIBC: 20 ug/dL — ABNORMAL LOW (ref 125–400)

## 2013-09-13 LAB — CBC
HCT: 21.2 % — ABNORMAL LOW (ref 36.0–46.0)
Hemoglobin: 6.9 g/dL — CL (ref 12.0–15.0)
MCH: 30.9 pg (ref 26.0–34.0)
MCHC: 32.5 g/dL (ref 30.0–36.0)
MCV: 95.1 fL (ref 78.0–100.0)
Platelets: 167 10*3/uL (ref 150–400)
RBC: 2.23 MIL/uL — ABNORMAL LOW (ref 3.87–5.11)
RDW: 15.3 % (ref 11.5–15.5)
WBC: 5.7 10*3/uL (ref 4.0–10.5)

## 2013-09-13 LAB — POCT I-STAT 3, ART BLOOD GAS (G3+)
Acid-base deficit: 11 mmol/L — ABNORMAL HIGH (ref 0.0–2.0)
Bicarbonate: 13.4 mEq/L — ABNORMAL LOW (ref 20.0–24.0)
O2 Saturation: 99 %
TCO2: 14 mmol/L (ref 0–100)
pCO2 arterial: 23.2 mmHg — ABNORMAL LOW (ref 35.0–45.0)
pH, Arterial: 7.368 (ref 7.350–7.450)
pO2, Arterial: 160 mmHg — ABNORMAL HIGH (ref 80.0–100.0)

## 2013-09-13 LAB — PROTEIN / CREATININE RATIO, URINE
Creatinine, Urine: 95.36 mg/dL
PROTEIN CREATININE RATIO: 8.29 — AB (ref 0.00–0.15)
Total Protein, Urine: 790.9 mg/dL

## 2013-09-13 LAB — COMPREHENSIVE METABOLIC PANEL
ALK PHOS: 77 U/L (ref 39–117)
ALT: 7 U/L (ref 0–35)
ANION GAP: 15 (ref 5–15)
AST: 12 U/L (ref 0–37)
Albumin: 1.8 g/dL — ABNORMAL LOW (ref 3.5–5.2)
BUN: 46 mg/dL — AB (ref 6–23)
CALCIUM: 7.5 mg/dL — AB (ref 8.4–10.5)
CO2: 13 mEq/L — ABNORMAL LOW (ref 19–32)
Chloride: 110 mEq/L (ref 96–112)
Creatinine, Ser: 3.65 mg/dL — ABNORMAL HIGH (ref 0.50–1.10)
GFR, EST AFRICAN AMERICAN: 14 mL/min — AB (ref >90)
GFR, EST NON AFRICAN AMERICAN: 12 mL/min — AB (ref >90)
Glucose, Bld: 87 mg/dL (ref 70–99)
POTASSIUM: 3.8 meq/L (ref 3.7–5.3)
Sodium: 138 mEq/L (ref 137–147)
TOTAL PROTEIN: 5.3 g/dL — AB (ref 6.0–8.3)
Total Bilirubin: 0.2 mg/dL — ABNORMAL LOW (ref 0.3–1.2)

## 2013-09-13 LAB — SODIUM, URINE, RANDOM: Sodium, Ur: 67 mEq/L

## 2013-09-13 LAB — MAGNESIUM: Magnesium: 2 mg/dL (ref 1.5–2.5)

## 2013-09-13 LAB — PREPARE RBC (CROSSMATCH)

## 2013-09-13 LAB — MRSA PCR SCREENING: MRSA by PCR: POSITIVE — AB

## 2013-09-13 LAB — CREATININE, URINE, RANDOM: CREATININE, URINE: 95.17 mg/dL

## 2013-09-13 LAB — VITAMIN B12: Vitamin B-12: 956 pg/mL — ABNORMAL HIGH (ref 211–911)

## 2013-09-13 LAB — GLUCOSE, CAPILLARY
GLUCOSE-CAPILLARY: 102 mg/dL — AB (ref 70–99)
Glucose-Capillary: 105 mg/dL — ABNORMAL HIGH (ref 70–99)
Glucose-Capillary: 116 mg/dL — ABNORMAL HIGH (ref 70–99)
Glucose-Capillary: 77 mg/dL (ref 70–99)
Glucose-Capillary: 85 mg/dL (ref 70–99)
Glucose-Capillary: 94 mg/dL (ref 70–99)

## 2013-09-13 LAB — FERRITIN: Ferritin: 625 ng/mL — ABNORMAL HIGH (ref 10–291)

## 2013-09-13 LAB — ABO/RH: ABO/RH(D): A POS

## 2013-09-13 LAB — PHOSPHORUS: Phosphorus: 5.1 mg/dL — ABNORMAL HIGH (ref 2.3–4.6)

## 2013-09-13 MED ORDER — LEVETIRACETAM 500 MG PO TABS
500.0000 mg | ORAL_TABLET | Freq: Every day | ORAL | Status: DC
  Administered 2013-09-14 – 2013-09-17 (×3): 500 mg via ORAL
  Filled 2013-09-13 (×4): qty 1

## 2013-09-13 MED ORDER — LEVETIRACETAM 250 MG PO TABS
250.0000 mg | ORAL_TABLET | Freq: Every day | ORAL | Status: DC
  Administered 2013-09-13 – 2013-09-16 (×4): 250 mg via ORAL
  Filled 2013-09-13 (×6): qty 1

## 2013-09-13 MED ORDER — DULOXETINE HCL 30 MG PO CPEP
30.0000 mg | ORAL_CAPSULE | Freq: Every day | ORAL | Status: DC
  Administered 2013-09-13 – 2013-09-17 (×5): 30 mg via ORAL
  Filled 2013-09-13 (×5): qty 1

## 2013-09-13 MED ORDER — CHLORHEXIDINE GLUCONATE CLOTH 2 % EX PADS
6.0000 | MEDICATED_PAD | Freq: Every day | CUTANEOUS | Status: AC
  Administered 2013-09-13 – 2013-09-17 (×5): 6 via TOPICAL

## 2013-09-13 MED ORDER — MUPIROCIN 2 % EX OINT
1.0000 "application " | TOPICAL_OINTMENT | Freq: Two times a day (BID) | CUTANEOUS | Status: DC
  Administered 2013-09-13 – 2013-09-17 (×8): 1 via NASAL
  Filled 2013-09-13: qty 22

## 2013-09-13 MED ORDER — CETYLPYRIDINIUM CHLORIDE 0.05 % MT LIQD
7.0000 mL | Freq: Four times a day (QID) | OROMUCOSAL | Status: DC
  Administered 2013-09-13 – 2013-09-14 (×7): 7 mL via OROMUCOSAL

## 2013-09-13 MED ORDER — CHLORHEXIDINE GLUCONATE 0.12 % MT SOLN
15.0000 mL | Freq: Two times a day (BID) | OROMUCOSAL | Status: DC
  Administered 2013-09-13 – 2013-09-17 (×10): 15 mL via OROMUCOSAL
  Filled 2013-09-13 (×11): qty 15

## 2013-09-13 MED ORDER — HYDROCORTISONE 1 % EX CREA
TOPICAL_CREAM | Freq: Two times a day (BID) | CUTANEOUS | Status: AC
  Administered 2013-09-13 – 2013-09-16 (×6): via TOPICAL
  Filled 2013-09-13: qty 28

## 2013-09-13 NOTE — Progress Notes (Signed)
S:trached.  CO nausea O:BP 183/65  Pulse 68  Temp(Src) 98.5 F (36.9 C) (Oral)  Resp 22  Wt 57.4 kg (126 lb 8.7 oz)  SpO2 100%  Intake/Output Summary (Last 24 hours) at 09/13/13 0743 Last data filed at 09/13/13 0600  Gross per 24 hour  Intake    700 ml  Output     42 ml  Net    658 ml   Weight change:  UDJ:SHFWY and alert CVS:RRR 2/6 systolic M Resp:decreased BS Lt base Abd:+ BS NTND Ext:1-2+ edema NEURO:Follows commands   . amLODipine  5 mg Oral Daily  . cefTRIAXone (ROCEPHIN)  IV  1 g Intravenous Q24H  . Chlorhexidine Gluconate Cloth  6 each Topical Q0600  . docusate sodium  100 mg Oral BID  . hydrALAZINE  100 mg Oral 3 times per day  . labetalol  300 mg Oral TID  . mupirocin ointment  1 application Nasal BID  . pantoprazole (PROTONIX) IV  40 mg Intravenous QHS  . tiZANidine  2 mg Oral TID   US Renal  09/12/2013   CLINICAL DATA:  Acute renal failure, urinary tract infection  EXAM: RENAL/URINARY TRACT ULTRASOUND COMPLETE  COMPARISON:  None.  FINDINGS: Right Kidney:  Length: 11.0 cm. Kidney is mildly to moderately echogenic. There is mild prominence of the intrarenal collecting system and renal pelvis.  Left Kidney:  Length: 10.2 cm. Mild to moderate increased echogenicity. 6 mm upper pole stone. 12 mm midpole stone in the region of the renal pelvis.  Bladder:  Bladder wall thickened to 11 mm  Small volume of ascites in the pelvis.  Left pleural effusion.  IMPRESSION: 1. Medical renal disease 2. Nonobstructing left renal calculi 3. Mild right hydronephrosis 4. Bladder wall thickening appears diffuse and suggests infiltrative abnormality of the bladder wall possibly indicating cystitis.   Electronically Signed   By: Skipper Cliche M.D.   On: 09/12/2013 23:47   Dg Chest Port 1 View  09/13/2013   CLINICAL DATA:  Left lower lobe pulmonary consolidation.  EXAM: PORTABLE CHEST - 1 VIEW  COMPARISON:  09/12/2013  FINDINGS: Stable dense consolidation of the left lower lobe likely  representing pneumonia. There is associated adjacent small to moderate-sized pleural effusion. No pulmonary edema. Stable positioning of PICC line and tracheostomy tube. The heart size remains normal.  IMPRESSION: Stable dense consolidation of the left lower lobe likely representing pneumonia. Small to moderate adjacent parapneumonic effusion.   Electronically Signed   By: Aletta Edouard M.D.   On: 09/13/2013 07:28   Dg Chest Portable 1 View  09/12/2013   CLINICAL DATA:  PICC placement.  EXAM: PORTABLE CHEST - 1 VIEW  COMPARISON:  12/02/2011.  FINDINGS: LEFT lower lobe atelectasis, effusion, and possible consolidation. Tracheostomy. Cardiomegaly.  The PICC line from RIGHT arm approach has its tip at the cavoatrial junction in good position. No pneumothorax. RIGHT lung clear.  Worsening aeration from priors.  IMPRESSION: PICC line from RIGHT arm approach lies with its tip at the cavoatrial junction. LEFT lower lobe process as described.   Electronically Signed   By: Rolla Flatten M.D.   On: 09/12/2013 17:14   BMET    Component Value Date/Time   NA 138 09/13/2013 0300   K 3.8 09/13/2013 0300   CL 110 09/13/2013 0300   CO2 13* 09/13/2013 0300   GLUCOSE 87 09/13/2013 0300   BUN 46* 09/13/2013 0300   CREATININE 3.65* 09/13/2013 0300   CALCIUM 7.5* 09/13/2013 0300   GFRNONAA 12*  09/13/2013 0300   GFRAA 14* 09/13/2013 0300   CBC    Component Value Date/Time   WBC 5.7 09/13/2013 0300   RBC 2.23* 09/13/2013 0300   HGB 6.9* 09/13/2013 0300   HCT 21.2* 09/13/2013 0300   PLT 167 09/13/2013 0300   MCV 95.1 09/13/2013 0300   MCH 30.9 09/13/2013 0300   MCHC 32.5 09/13/2013 0300   RDW 15.3 09/13/2013 0300   LYMPHSABS 1.2 09/12/2013 1750   MONOABS 0.3 09/12/2013 1750   EOSABS 0.5 09/12/2013 1750   BASOSABS 0.1 09/12/2013 1750     Assessment: 1. 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 Met acidosis 3. VDRF 4. Anemia 5. ? UTI 6. HTN Plan: 1. COnt bicarb gtt 2. Transfuse 3. Check iron studies, B12, folate 4. Daily labs   [name] T

## 2013-09-13 NOTE — Progress Notes (Signed)
eLink Physician-Brief Progress Note Patient Name: [name] DOB: 1950-08-03 MRN: [id number]  Date of Service  09/13/2013   HPI/Events of Note    Recent Labs Lab 09/12/13 1750 09/13/13 0300  HGB 7.3* 6.9*    eICU Interventions   PRBC x 1   Intervention Category Major Interventions: Other:  [name] 09/13/2013, 4:07 AM

## 2013-09-13 NOTE — Progress Notes (Signed)
eLink Physician-Brief Progress Note Patient Name: [name] DOB: [birth year] MRN: [id number]  Date of Service  09/13/2013   HPI/Events of Note   Itching near PICC site. Non-tender per RN.  eICU Interventions   Cortisone Cream   Intervention Category Minor Interventions: OtherCurt Bears, Tkeyah Burkman R. 09/13/2013, 4:36 PM

## 2013-09-13 NOTE — Progress Notes (Signed)
PULMONARY / CRITICAL CARE MEDICINE   Name: [name] LegatoSandra Cragun MRN: [id number] DOB: 04/04/1950    ADMISSION DATE:  09/12/2013  REFERRING MD :  Dr. Silverio LayYao in ED  CHIEF COMPLAINT:  Abnormal labs  INITIAL PRESENTATION:  63 yo female resident of [facility] for 4 yrs after developing neuromyelitis optica [facility] to [facility]NA, renal failure, acidosis and anemia.  She is blind, Lt hemiparesis, and chronic trach with vent dependence.  STUDIES:  8/08 Renal u/s >> medical renal disease  SIGNIFICANT EVENTS: 8/08 Admit, renal consulted  SUBJECTIVE:  Wide awake and in no distress.  VITAL SIGNS: Temp:  [96.3 F (35.7 C)-99.6 F (37.6 C)] 98.7 F (37.1 C) (08/09 0807) Pulse Rate:  [56-97] 71 (08/09 0930) Resp:  [12-27] 12 (08/09 0930) BP: (143-196)/(59-104) 160/60 mmHg (08/09 0930) SpO2:  [100 %] 100 % (08/09 0930) FiO2 (%):  [30 %] 30 % (08/09 0921) Weight:  [126 lb 8.7 oz (57.4 kg)] 126 lb 8.7 oz (57.4 kg) (08/09 0500) VENTILATOR SETTINGS: Vent Mode:  [-] PRVC FiO2 (%):  [30 %] 30 % Set Rate:  [22 bmp] 22 bmp Vt Set:  [500 mL] 500 mL PEEP:  [5 cmH20] 5 cmH20 Plateau Pressure:  [19 cmH20-26 cmH20] 24 cmH20 INTAKE / OUTPUT:  Intake/Output Summary (Last 24 hours) at 09/13/13 1114 Last data filed at 09/13/13 0900  Gross per 24 hour  Intake   1118 ml  Output     82 ml  Net   1036 ml    PHYSICAL EXAMINATION: General:  Awake, alert, oriented, mouthing off words appropirately Neuro:  Able to move her legs but restricted due to contractures, no facial droop, tongue midline HEENT:  Atraumatic, trache site looks clean, has chronic skin changes around the trache, eyes open Cardiovascular:  RRR, 3/6 systolic murmur Lungs:  Fair air movement, no wheeze, mild rhonchi Abdomen: soft, nontender, no guarding Musculoskeletal:  Has flexion contractures on both arms, has extensor contractures on both legs, mild edema on her arms, mild edema on the face Skin:  (+) sacral decubitus ulcer St.  1  LABS:  CBC  Recent Labs Lab 09/12/13 1750 09/13/13 0300  WBC 6.9 5.7  HGB 7.3* 6.9*  HCT 22.2* 21.2*  PLT 154 167   BMET  Recent Labs Lab 09/12/13 1750 09/13/13 0300  NA 136* 138  K 4.1 3.8  CL 111 110  CO2 <7* 13*  BUN 47* 46*  CREATININE 4.15* 3.65*  GLUCOSE 78 87   Electrolytes  Recent Labs Lab 09/12/13 1750 09/13/13 0300  CALCIUM 7.7* 7.5*  MG  --  2.0  PHOS  --  5.1*   Sepsis Markers  Recent Labs Lab 09/12/13 1758  LATICACIDVEN <0.30*   ABG  Recent Labs Lab 09/12/13 1654 09/13/13 0341  PHART 7.213* 7.368  PCO2ART 26.4* 23.2*  PO2ART 133.0* 160.0*   Liver Enzymes  Recent Labs Lab 09/12/13 1750 09/13/13 0300  AST 11 12  ALT 7 7  ALKPHOS 85 77  BILITOT <0.2* 0.2*  ALBUMIN 1.9* 1.8*   Glucose  Recent Labs Lab 09/13/13 0024 09/13/13 0425 09/13/13 0742  GLUCAP 77 85 94    Imaging [place] Renal  09/12/2013   CLINICAL DATA:  Acute renal failure, urinary tract infection  EXAM: RENAL/URINARY TRACT ULTRASOUND COMPLETE  COMPARISON:  None.  FINDINGS: Right Kidney:  Length: 11.0 cm. Kidney is mildly to moderately echogenic. There is mild prominence of the intrarenal collecting system and renal pelvis.  Left Kidney:  Length: 10.2 cm.  Mild to moderate increased echogenicity. 6 mm upper pole stone. 12 mm midpole stone in the region of the renal pelvis.  Bladder:  Bladder wall thickened to 11 mm  Small volume of ascites in the pelvis.  Left pleural effusion.  IMPRESSION: 1. Medical renal disease 2. Nonobstructing left renal calculi 3. Mild right hydronephrosis 4. Bladder wall thickening appears diffuse and suggests infiltrative abnormality of the bladder wall possibly indicating cystitis.   Electronically Signed   By: Esperanza Heir M.D.   On: 09/12/2013 23:47   Dg Chest Portable 1 View  09/12/2013   CLINICAL DATA:  PICC placement.  EXAM: PORTABLE CHEST - 1 VIEW  COMPARISON:  12/02/2011.  FINDINGS: LEFT lower lobe atelectasis, effusion, and possible  consolidation. Tracheostomy. Cardiomegaly.  The PICC line from RIGHT arm approach has its tip at the cavoatrial junction in good position. No pneumothorax. RIGHT lung clear.  Worsening aeration from priors.  IMPRESSION: PICC line from RIGHT arm approach lies with its tip at the cavoatrial junction. LEFT lower lobe process as described.   Electronically Signed   By: Davonna Belling M.D.   On: 09/12/2013 17:14   CXR 8/9: NSC in dense consolidation lll   ASSESSMENT / PLAN:  PULMONARY Trach >>  A:  Acute on chronic respiratory failure 2nd to PNA. S/p trach. P:   Full vent support F/u CXR  CARDIOVASCULAR PICC RT >>  A:  HTN, CAD, hx of cardiomyopathy P:  Continue BP meds  RENAL A:   AKI >> Creatine improved 8/9. Metabolic acidosis. P:   Continue HCO3 and IV fluids  GASTROINTESTINAL A:  Nutrition. P:   Renal diet  HEMATOLOGIC A:   Anemia of chronic disease, also has positive stool guaiac >> Transfused 8/9. P:  Continue darbopoietin No heparin for now until we are sure there is GI bleed, monitor Hb Transfused for hgb <7.0  INFECTIOUS A:  PNA, UTI. P:   BCx2 >> UC >> Sputum none Abx: Got zosyn + vanco in ED x 1 Rocephin 8/8>>  ENDOCRINE A:   No active issue P:   Monitor blood sugar  NEUROLOGIC A:   Known neuromyelitis optica P:   Monitor clinically  TODAY'S SUMMARY: admitted for acute renal failure with metabolic acidosis, possible UTI. LLL opacity .   Brett Canales Minor ACNP [facility] P[facility] Pager [phone] till 3 pm If no answer page [phone] 09/13/2013, 11:25 AM  Reviewed above, examined.  Continue tx for PNA, UTI.  Appreciate help from renal.  F/u Hb after transfusion.  Updated family at bedside.  CC time 35 minutes.  Coralyn Helling, MD [facility] Pulmonary/Critical Care 09/13/2013, 2:54 PM Pager:  [phone] After 3pm call: [phone]

## 2013-09-14 ENCOUNTER — Inpatient Hospital Stay (HOSPITAL_COMMUNITY): Payer: Medicare Other

## 2013-09-14 DIAGNOSIS — N390 Urinary tract infection, site not specified: Secondary | ICD-10-CM

## 2013-09-14 DIAGNOSIS — J189 Pneumonia, unspecified organism: Secondary | ICD-10-CM

## 2013-09-14 LAB — BASIC METABOLIC PANEL
ANION GAP: 14 (ref 5–15)
BUN: 44 mg/dL — ABNORMAL HIGH (ref 6–23)
CHLORIDE: 106 meq/L (ref 96–112)
CO2: 21 mEq/L (ref 19–32)
CREATININE: 3.75 mg/dL — AB (ref 0.50–1.10)
Calcium: 7.4 mg/dL — ABNORMAL LOW (ref 8.4–10.5)
GFR, EST AFRICAN AMERICAN: 14 mL/min — AB (ref >90)
GFR, EST NON AFRICAN AMERICAN: 12 mL/min — AB (ref >90)
Glucose, Bld: 89 mg/dL (ref 70–99)
Potassium: 3.2 mEq/L — ABNORMAL LOW (ref 3.7–5.3)
Sodium: 141 mEq/L (ref 137–147)

## 2013-09-14 LAB — TYPE AND SCREEN
ABO/RH(D): A POS
Antibody Screen: NEGATIVE
Unit division: 0

## 2013-09-14 LAB — CBC
HCT: 23.6 % — ABNORMAL LOW (ref 36.0–46.0)
Hemoglobin: 8 g/dL — ABNORMAL LOW (ref 12.0–15.0)
MCH: 30.2 pg (ref 26.0–34.0)
MCHC: 33.9 g/dL (ref 30.0–36.0)
MCV: 89.1 fL (ref 78.0–100.0)
PLATELETS: 132 10*3/uL — AB (ref 150–400)
RBC: 2.65 MIL/uL — ABNORMAL LOW (ref 3.87–5.11)
RDW: 17.5 % — AB (ref 11.5–15.5)
WBC: 5.3 10*3/uL (ref 4.0–10.5)

## 2013-09-14 LAB — OCCULT BLOOD, POC DEVICE: Fecal Occult Bld: POSITIVE — AB

## 2013-09-14 LAB — GLUCOSE, CAPILLARY: GLUCOSE-CAPILLARY: 120 mg/dL — AB (ref 70–99)

## 2013-09-14 LAB — FOLATE RBC: RBC Folate: 775 ng/mL — ABNORMAL HIGH (ref >280)

## 2013-09-14 LAB — PHOSPHORUS: Phosphorus: 4.3 mg/dL (ref 2.3–4.6)

## 2013-09-14 MED ORDER — DARBEPOETIN ALFA-POLYSORBATE 200 MCG/0.4ML IJ SOLN
200.0000 ug | INTRAMUSCULAR | Status: DC
  Administered 2013-09-14: 200 ug via SUBCUTANEOUS
  Filled 2013-09-14: qty 0.4

## 2013-09-14 MED ORDER — AMLODIPINE BESYLATE 10 MG PO TABS
10.0000 mg | ORAL_TABLET | Freq: Every day | ORAL | Status: DC
  Administered 2013-09-14 – 2013-09-17 (×4): 10 mg via ORAL
  Filled 2013-09-14 (×4): qty 1

## 2013-09-14 MED ORDER — CIPROFLOXACIN HCL 0.3 % OP SOLN
2.0000 [drp] | Freq: Four times a day (QID) | OPHTHALMIC | Status: DC
  Administered 2013-09-14 – 2013-09-17 (×14): 2 [drp] via OPHTHALMIC
  Filled 2013-09-14 (×2): qty 2.5

## 2013-09-14 MED ORDER — PANTOPRAZOLE SODIUM 40 MG PO TBEC
40.0000 mg | DELAYED_RELEASE_TABLET | Freq: Every day | ORAL | Status: DC
  Administered 2013-09-14 – 2013-09-16 (×3): 40 mg via ORAL
  Filled 2013-09-14 (×3): qty 1

## 2013-09-14 MED ORDER — CETYLPYRIDINIUM CHLORIDE 0.05 % MT LIQD
7.0000 mL | Freq: Four times a day (QID) | OROMUCOSAL | Status: DC
  Administered 2013-09-15 – 2013-09-17 (×11): 7 mL via OROMUCOSAL

## 2013-09-14 MED ORDER — POTASSIUM CHLORIDE CRYS ER 20 MEQ PO TBCR
40.0000 meq | EXTENDED_RELEASE_TABLET | Freq: Once | ORAL | Status: AC
  Administered 2013-09-14: 40 meq via ORAL
  Filled 2013-09-14: qty 2

## 2013-09-14 MED ORDER — SODIUM BICARBONATE 650 MG PO TABS
1300.0000 mg | ORAL_TABLET | Freq: Two times a day (BID) | ORAL | Status: DC
  Administered 2013-09-14 – 2013-09-17 (×7): 1300 mg via ORAL
  Filled 2013-09-14 (×8): qty 2

## 2013-09-14 NOTE — Clinical Social Work Psychosocial (Signed)
Clinical Social Work Department BRIEF PSYCHOSOCIAL ASSESSMENT 09/14/2013  Patient:  [name], [name]     Account Number:  [id number]     Admit date:  09/12/2013  Clinical Social Worker:  Mosie Epstein  Date/Time:  09/14/2013 01:11 PM  Referred by:  Physician  Date Referred:  09/14/2013 Referred for  SNF Placement   Other Referral:   none.   Interview type:  Family Other interview type:   CSW spoke with pt's sister, Verity Overcash [phone]), regarding discharge disposition.    PSYCHOSOCIAL DATA Living Status:  FACILITY Admitted from facility:  Other Level of care:  Skilled Nursing Facility Primary support name:  Zer Gabor Primary support relationship to patient:  SIBLING Degree of support available:   Strong support system.    CURRENT CONCERNS Current Concerns  Post-Acute Placement   Other Concerns:   none.    SOCIAL WORK ASSESSMENT / PLAN CSW received notification stating pt from [facility]. CSW spoke with pt's sister, Emmarose Muchnick, regarding pt's discharge disposition. Pt's sister stated pt has lived at [facility] for the past four years. Pt's sister informed CSW pt's family would prefer for pt to return to [facility] at time of discharge. CSW to continue to follow and assist with discharge planning needs.   Assessment/plan status:  Psychosocial Support/Ongoing Assessment of Needs Other assessment/ plan:   none.   Information/referral to community resources:   Pt to return to [facility].    PATIENT'S/FAMILY'S RESPONSE TO PLAN OF CARE: Pt's sister understanding and agreeable to CSW plan of care. Pt's sister expressed no further questions or concerns at this time.        Marcelline Deist, MSW, [facility] Licensed Clinical Social Worker [phone] and [phone]

## 2013-09-14 NOTE — ED Provider Notes (Signed)
I saw and evaluated the patient, reviewed the resident's note and I agree with the findings and plan.   EKG Interpretation None      [name] is a 63 y.o. 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 Was at a facility and sent here for Cr 4.5 (baseline around 2), bicarb 10, Hg 7.5. Patient unable to give history. Patient has picc line and is currently on levaquin for pneumonia. Chronically ill appearing, hypothermic. Not hypotensive. On vent via trach. Dec breath sounds bilateral bases. Peg tube in place. Nontender abdomen. 2+ edema bilateral legs. Labs significant for pH 7.2 with bicarb 10. Hg 7.5, Occ positive. Also UTI and possible pneumonia. She likely has HCAP, UTI, acidosis, acute renal failure. She is started on protonix and transfused. I called nephrology to follow and possibly get urgent dialysis. She is vent dependent so ICU will admit.   Level V caveat- condition of patient    CRITICAL CARE Performed by: Silverio Lay, DAVID   Total critical care time: 30 min   Critical care time was exclusive of separately billable procedures and treating other patients.  Critical care was necessary to treat or prevent imminent or life-threatening deterioration.  Critical care was time spent personally by me on the following activities: development of treatment plan with patient and/or surrogate as well as nursing, discussions with consultants, evaluation of patient's response to treatment, examination of patient, obtaining history from patient or surrogate, ordering and performing treatments and interventions, ordering and review of laboratory studies, ordering and review of radiographic studies, pulse oximetry and re-evaluation of patient's condition.   Richardean Canal, MD 09/14/13 [phone]

## 2013-09-14 NOTE — Consult Note (Signed)
Subjective: I was asked to see [name] in consultation by Dr. Tyson Alias for an elevated Cr and right hydro.   She has a complicated medical history but no prior GU history.   She has left hemiparesis and is trach dependent.  She is managed with a foley.   She was admitted with a probable UTI and ARF with a Cr of 4.15 which was an increase from baseline of 1.6.   The patient reports some left flank pain last week but that has resolved.   She denies prior stones but on [place] she had left renal stones including a 24mm non-obstructing left renal pelvic stone.  There was mild right hydro but a point of obstruction wasn't seen.    ROS:  Review of Systems  Unable to perform ROS: intubated   Allergies  Allergen Reactions  . Neurontin [Gabapentin] Other (See Comments)    Per MAR, unknown  . Xanax [Alprazolam] Other (See Comments)    Per MAR, unknown    Past Medical History  Diagnosis Date  . Neuromyelitis optica   . Blindness   . Hemiparesis     left sided  . Hypertension   . Cardiomyopathy   . Coronary artery disease   . Ventilator dependent   . Respiratory failure   . Depression   . Anemia   . Seizures     Past Surgical History  Procedure Laterality Date  . Tracheostomy      History   Social History  . Marital Status: Unknown    Spouse Name: N/A    Number of Children: N/A  . Years of Education: N/A   Occupational History  . Not on file.   Social History Main Topics  . Smoking status: Never Smoker   . Smokeless tobacco: Not on file  . Alcohol Use: No  . Drug Use: No  . Sexual Activity: Not on file   Other Topics Concern  . Not on file   Social History Narrative  . No narrative on file    No family history on file.  Past, Social and Family history reviewed.    Anti-infectives: Anti-infectives   Start     Dose/Rate Route Frequency Ordered Stop   09/13/13 0400  cefTRIAXone (ROCEPHIN) 1 g in dextrose 5 % 50 mL IVPB     1 g 100 mL/hr over 30 Minutes  Intravenous Every 24 hours 09/12/13 2209     09/12/13 1715  vancomycin (VANCOCIN) 2,000 mg in sodium chloride 0.9 % 500 mL IVPB     2,000 mg 250 mL/hr over 120 Minutes Intravenous  Once 09/12/13 1702 09/12/13 2104   09/12/13 1715  piperacillin-tazobactam (ZOSYN) IVPB 3.375 g     3.375 g 100 mL/hr over 30 Minutes Intravenous  Once 09/12/13 1702 09/12/13 1900      Current Facility-Administered Medications  Medication Dose Route Frequency Provider Last Rate Last Dose  . 0.9 %  sodium chloride infusion  250 mL Intravenous PRN Wadie Lessen, MD      . albuterol (PROVENTIL) (2.5 MG/3ML) 0.083% nebulizer solution 2.5 mg  2.5 mg Nebulization Q2H PRN Wadie Lessen, MD      . amLODipine (NORVASC) tablet 10 mg  10 mg Oral Daily Trevor Iha, MD   10 mg at 09/14/13 0903  . antiseptic oral rinse (CPC / CETYLPYRIDINIUM CHLORIDE 0.05%) solution 7 mL  7 mL Mouth Rinse QID Nelda Bucks, MD   7 mL at 09/14/13 1400  . cefTRIAXone (ROCEPHIN)  1 g in dextrose 5 % 50 mL IVPB  1 g Intravenous Q24H Wadie LessenMario Christianto, MD   1 g at 09/14/13 0356  . chlorhexidine (PERIDEX) 0.12 % solution 15 mL  15 mL Mouth Rinse BID Nelda Bucksaniel J Feinstein, MD   15 mL at 09/14/13 0900  . Chlorhexidine Gluconate Cloth 2 % PADS 6 each  6 each Topical Q0600 Nelda Bucksaniel J Feinstein, MD   6 each at 09/14/13 0600  . ciprofloxacin (CILOXAN) 0.3 % ophthalmic solution 2 drop  2 drop Right Eye QID Nelda Bucksaniel J Feinstein, MD   2 drop at 09/14/13 1430  . darbepoetin (ARANESP) injection 200 mcg  200 mcg Subcutaneous Q Mon-1800 Llana AlimentJames L Deterding, MD      . docusate sodium (COLACE) capsule 100 mg  100 mg Oral BID Wadie LessenMario Christianto, MD   100 mg at 09/14/13 0904  . DULoxetine (CYMBALTA) DR capsule 30 mg  30 mg Oral Daily Coralyn HellingVineet Sood, MD   30 mg at 09/14/13 0936  . hydrALAZINE (APRESOLINE) injection 10 mg  10 mg Intravenous Q4H PRN Wadie LessenMario Christianto, MD   10 mg at 09/14/13 0350  . hydrALAZINE (APRESOLINE) tablet 100 mg  100 mg Oral 3 times per day  Wadie LessenMario Christianto, MD   100 mg at 09/14/13 1500  . hydrocortisone cream 1 %   Topical BID Jenelle MagesAdam R. Belanger, MD      . labetalol (NORMODYNE) tablet 300 mg  300 mg Oral TID Wadie LessenMario Christianto, MD   300 mg at 09/14/13 1600  . levETIRAcetam (KEPPRA) tablet 250 mg  250 mg Oral QHS Coralyn HellingVineet Sood, MD   250 mg at 09/13/13 2256  . levETIRAcetam (KEPPRA) tablet 500 mg  500 mg Oral Daily Coralyn HellingVineet Sood, MD   500 mg at 09/14/13 0935  . mupirocin ointment (BACTROBAN) 2 % 1 application  1 application Nasal BID Nelda Bucksaniel J Feinstein, MD   1 application at 09/14/13 [phone]  . ondansetron (ZOFRAN) injection 4 mg  4 mg Intravenous Q6H PRN Wadie LessenMario Christianto, MD   4 mg at 09/14/13 [id number]  . pantoprazole (PROTONIX) EC tablet 40 mg  40 mg Oral Daily Nelda Bucksaniel J Feinstein, MD      . sodium bicarbonate tablet 1,300 mg  1,300 mg Oral BID Trevor IhaJames L Deterding, MD   1,300 mg at 09/14/13 0919  . tiZANidine (ZANAFLEX) tablet 2 mg  2 mg Oral TID Wadie LessenMario Christianto, MD   2 mg at 09/14/13 1600     Objective: Vital signs in last 24 hours: Temp:  [98 F (36.7 C)-98.9 F (37.2 C)] 98.4 F (36.9 C) (08/10 1505) Pulse Rate:  [55-99] 72 (08/10 1700) Resp:  [0-24] 0 (08/10 1700) BP: (139-186)/(67-102) 160/69 mmHg (08/10 1700) SpO2:  [100 %] 100 % (08/10 1700) FiO2 (%):  [22 %-30 %] 30 % (08/10 1700) Weight:  [60.2 kg (132 lb 11.5 oz)] 60.2 kg (132 lb 11.5 oz) (08/10 0500)  Intake/Output from previous day: 08/09 0701 - 08/10 0700 In: 2718 [P.O.:318; I.V.:2350; IV Piggyback:50] Out: 690 [Urine:690] Intake/Output this shift: Total I/O In: 220 [P.O.:120; I.V.:100] Out: -    Physical Exam  Constitutional:  She is WD, WN with left hemiparesis and right sided weakness.  She has a trach but is alert and able to mouth responses to some questions.    HENT:  Head: Normocephalic and atraumatic.  With periocular edema.  Eyes:  She is blind.   Neck:  She has a tracheostomy  Cardiovascular: Normal rate, regular rhythm and normal heart sounds.  Pulmonary/Chest: Effort normal and breath sounds normal.  On vent  Abdominal: Bowel sounds are normal.  Firm without mass or CVAT.  No hernias noted.   Genitourinary:  Foley indwelling.   Musculoskeletal: She exhibits edema (diffuse). She exhibits no tenderness.  Neurological:  Left hemiparesis with contractions of the left hand.  She has weakness on the right as well.   Skin: Skin is warm and dry.    Lab Results:   Recent Labs  09/13/13 0300 09/14/13 0500  WBC 5.7 5.3  HGB 6.9* 8.0*  HCT 21.2* 23.6*  PLT 167 132*   BMET  Recent Labs  09/13/13 0300 09/14/13 0500  NA 138 141  K 3.8 3.2*  CL 110 106  CO2 13* 21  GLUCOSE 87 89  BUN 46* 44*  CREATININE 3.65* 3.75*  CALCIUM 7.5* 7.4*   PT/INR No results found for this basename: LABPROT, INR,  in the last 72 hours ABG  Recent Labs  09/12/13 1654 09/13/13 0341  PHART 7.213* 7.368  HCO3 10.8* 13.4*    Studies/Results: US Renal  09/12/2013   CLINICAL DATA:  Acute renal failure, urinary tract infection  EXAM: RENAL/URINARY TRACT ULTRASOUND COMPLETE  COMPARISON:  None.  FINDINGS: Right Kidney:  Length: 11.0 cm. Kidney is mildly to moderately echogenic. There is mild prominence of the intrarenal collecting system and renal pelvis.  Left Kidney:  Length: 10.2 cm. Mild to moderate increased echogenicity. 6 mm upper pole stone. 12 mm midpole stone in the region of the renal pelvis.  Bladder:  Bladder wall thickened to 11 mm  Small volume of ascites in the pelvis.  Left pleural effusion.  IMPRESSION: 1. Medical renal disease 2. Nonobstructing left renal calculi 3. Mild right hydronephrosis 4. Bladder wall thickening appears diffuse and suggests infiltrative abnormality of the bladder wall possibly indicating cystitis.   Electronically Signed   By: Esperanza Heir M.D.   On: 09/12/2013 23:47   Dg Chest Port 1 View  09/14/2013   CLINICAL DATA:  Difficulty breathing  EXAM: PORTABLE CHEST - 1 VIEW  COMPARISON:  September 13, 2013   FINDINGS: Tracheostomy position is stable compared to 1 day prior. It is difficult to ascertain the amount of the tracheostomy that is within the trachea given angulation at the tracheostomy site placement. Central catheter tip is in the right atrium. No pneumothorax. There is persistent effusion on the left with left lower lobe consolidation. Right lung is clear. Heart is mildly enlarged. Pulmonary vascularity is within normal limits. No adenopathy.  IMPRESSION: Tube and catheter positions are unchanged without pneumothorax. Left lower lobe consolidation with effusion, stable. No new opacity. Right lung clear.   Electronically Signed   By: Bretta Bang M.D.   On: 09/14/2013 07:31   Dg Chest Port 1 View  09/13/2013   CLINICAL DATA:  Left lower lobe pulmonary consolidation.  EXAM: PORTABLE CHEST - 1 VIEW  COMPARISON:  09/12/2013  FINDINGS: Stable dense consolidation of the left lower lobe likely representing pneumonia. There is associated adjacent small to moderate-sized pleural effusion. No pulmonary edema. Stable positioning of PICC line and tracheostomy tube. The heart size remains normal.  IMPRESSION: Stable dense consolidation of the left lower lobe likely representing pneumonia. Small to moderate adjacent parapneumonic effusion.   Electronically Signed   By: Irish Lack M.D.   On: 09/13/2013 07:28   I have reviewed her labs since admission and reviewed her [place] report and films.   Admission note reviewed.   Urine culture is pending.  MRSA positive.   Assessment: She has ARF with possible UTI and left renal stones with mild right hydronephrosis.  Plan: I am going to get a CT urogram to assess for a right ureteral stone.  If a stone or other cause of obstruction is found, I will probably need to consider a stent or possibly percutaneous drainage.   CC: Dr. Rory Percy    LOS: 2 days    Anner Crete 09/14/2013

## 2013-09-14 NOTE — Progress Notes (Signed)
PULMONARY / CRITICAL CARE MEDICINE   Name: [name] LegatoSandra Daffron MRN: [id number] DOB: 1950/12/26    ADMISSION DATE:  09/12/2013  REFERRING MD :  Dr. Silverio LayYao in ED  CHIEF COMPLAINT:  Abnormal labs  INITIAL PRESENTATION:  63 yo female resident of [facility] for 4 yrs after developing neuromyelitis optica [facility] due to PNA, renal failure, acidosis and anemia.  She is blind, Lt hemiparesis, and chronic trach with24h full  vent dependence but eats with vent and is on chronic rigidity  STUDIES:  8/08 Renal u/s >> medical renal disease  SIGNIFICANT EVENTS: 8/08 Admit, renal consulted 09/13/13: Wide awake and in no distress.    SUBJECTIVE/OVERNIGHT/INTERVAL HX  09/14/13: never on pressors. Acidosis seem resolving: bicarb stopped. Creat better. Continues on full vent support that is baseline. Family member feeding her lunch at this point. Seems to have recurring right eye conjuctivitis +  VITAL SIGNS: Temp:  [98 F (36.7 C)-98.9 F (37.2 C)] 98 F (36.7 C) (08/10 1217) Pulse Rate:  [55-99] 64 (08/10 1200) Resp:  [0-24] 0 (08/10 1200) BP: (135-186)/(67-102) 145/73 mmHg (08/10 1200) SpO2:  [100 %] 100 % (08/10 1200) FiO2 (%):  [30 %] 30 % (08/10 1200) Weight:  [60.2 kg (132 lb 11.5 oz)] 60.2 kg (132 lb 11.5 oz) (08/10 0500) VENTILATOR SETTINGS: Vent Mode:  [-] PRVC FiO2 (%):  [30 %] 30 % Set Rate:  [22 bmp] 22 bmp Vt Set:  [500 mL] 500 mL PEEP:  [5 cmH20] 5 cmH20 Plateau Pressure:  [24 cmH20-30 cmH20] 30 cmH20 INTAKE / OUTPUT:  Intake/Output Summary (Last 24 hours) at 09/14/13 1352 Last data filed at 09/14/13 1200  Gross per 24 hour  Intake   2170 ml  Output    580 ml  Net   1590 ml    PHYSICAL EXAMINATION: General:  Awake, alert, mouthing off words appropirately Neuro:  Able to move her legs but restricted due to contractures, no facial droop, tongue midline HEENT:  Atraumatic, trache site looks clean, has chronic skin changes around the trache, eyes open with right eye  redness Cardiovascular:  RRR, 3/6 systolic murmur Lungs:  Fair air movement, no wheeze, mild rhonchi Abdomen: soft, nontender, no guarding Musculoskeletal:  Has flexion contractures on both arms, has extensor contractures on both legs, mild edema on her arms, mild edema on the face Skin:  (+) sacral decubitus ulcer St. 1  LABS: PULMONARY  Recent Labs Lab 09/12/13 1654 09/13/13 0341  PHART 7.213* 7.368  PCO2ART 26.4* 23.2*  PO2ART 133.0* 160.0*  HCO3 10.8* 13.4*  TCO2 12 14  O2SAT 99.0 99.0    CBC  Recent Labs Lab 09/12/13 1750 09/13/13 0300 09/14/13 0500  HGB 7.3* 6.9* 8.0*  HCT 22.2* 21.2* 23.6*  WBC 6.9 5.7 5.3  PLT 154 167 132*    COAGULATION No results found for this basename: INR,  in the last 168 hours  CARDIAC  No results found for this basename: TROPONINI,  in the last 168 hours No results found for this basename: PROBNP,  in the last 168 hours   CHEMISTRY  Recent Labs Lab 09/12/13 1750 09/13/13 0300 09/14/13 0500  NA 136* 138 141  K 4.1 3.8 3.2*  CL 111 110 106  CO2 <7* 13* 21  GLUCOSE 78 87 89  BUN 47* 46* 44*  CREATININE 4.15* 3.65* 3.75*  CALCIUM 7.7* 7.5* 7.4*  MG  --  2.0  --   PHOS  --  5.1* 4.3   CrCl is unknown because  there is no height on file for the current visit.   LIVER  Recent Labs Lab 09/12/13 1750 09/13/13 0300  AST 11 12  ALT 7 7  ALKPHOS 85 77  BILITOT <0.2* 0.2*  PROT 5.5* 5.3*  ALBUMIN 1.9* 1.8*     INFECTIOUS  Recent Labs Lab 09/12/13 1758  LATICACIDVEN <0.30*     ENDOCRINE CBG (last 3)   Recent Labs  09/13/13 1557 09/13/13 1955 09/14/13 0020  GLUCAP 102* 105* 120*         IMAGING x48h US Renal  09/12/2013   CLINICAL DATA:  Acute renal failure, urinary tract infection  EXAM: RENAL/URINARY TRACT ULTRASOUND COMPLETE  COMPARISON:  None.  FINDINGS: Right Kidney:  Length: 11.0 cm. Kidney is mildly to moderately echogenic. There is mild prominence of the intrarenal collecting system and  renal pelvis.  Left Kidney:  Length: 10.2 cm. Mild to moderate increased echogenicity. 6 mm upper pole stone. 12 mm midpole stone in the region of the renal pelvis.  Bladder:  Bladder wall thickened to 11 mm  Small volume of ascites in the pelvis.  Left pleural effusion.  IMPRESSION: 1. Medical renal disease 2. Nonobstructing left renal calculi 3. Mild right hydronephrosis 4. Bladder wall thickening appears diffuse and suggests infiltrative abnormality of the bladder wall possibly indicating cystitis.   Electronically Signed   By: Esperanza Heir M.D.   On: 09/12/2013 23:47   Dg Chest Port 1 View  09/14/2013   CLINICAL DATA:  Difficulty breathing  EXAM: PORTABLE CHEST - 1 VIEW  COMPARISON:  September 13, 2013  FINDINGS: Tracheostomy position is stable compared to 1 day prior. It is difficult to ascertain the amount of the tracheostomy that is within the trachea given angulation at the tracheostomy site placement. Central catheter tip is in the right atrium. No pneumothorax. There is persistent effusion on the left with left lower lobe consolidation. Right lung is clear. Heart is mildly enlarged. Pulmonary vascularity is within normal limits. No adenopathy.  IMPRESSION: Tube and catheter positions are unchanged without pneumothorax. Left lower lobe consolidation with effusion, stable. No new opacity. Right lung clear.   Electronically Signed   By: Bretta Bang M.D.   On: 09/14/2013 07:31   Dg Chest Port 1 View  09/13/2013   CLINICAL DATA:  Left lower lobe pulmonary consolidation.  EXAM: PORTABLE CHEST - 1 VIEW  COMPARISON:  09/12/2013  FINDINGS: Stable dense consolidation of the left lower lobe likely representing pneumonia. There is associated adjacent small to moderate-sized pleural effusion. No pulmonary edema. Stable positioning of PICC line and tracheostomy tube. The heart size remains normal.  IMPRESSION: Stable dense consolidation of the left lower lobe likely representing pneumonia. Small to moderate  adjacent parapneumonic effusion.   Electronically Signed   By: Irish Lack M.D.   On: 09/13/2013 07:28   Dg Chest Portable 1 View  09/12/2013   CLINICAL DATA:  PICC placement.  EXAM: PORTABLE CHEST - 1 VIEW  COMPARISON:  12/02/2011.  FINDINGS: LEFT lower lobe atelectasis, effusion, and possible consolidation. Tracheostomy. Cardiomegaly.  The PICC line from RIGHT arm approach has its tip at the cavoatrial junction in good position. No pneumothorax. RIGHT lung clear.  Worsening aeration from priors.  IMPRESSION: PICC line from RIGHT arm approach lies with its tip at the cavoatrial junction. LEFT lower lobe process as described.   Electronically Signed   By: Davonna Belling M.D.   On: 09/12/2013 17:14      ASSESSMENT / PLAN:  PULMONARY  Trach >>  A:  Acute on chronic respiratory failure 2nd to PNA. S/p trach.   - no change P:   Full vent support F/u CXR  CARDIOVASCULAR PICC RT >>  A:  HTN, CAD, hx of cardiomyopathy P:  Continue BP meds  RENAL A:   AKI >> Creatine improved 8/9 and 09/14/13 Metabolic acidosis.   - improving P:   DC  HCO3 Renal following  GASTROINTESTINAL A:  Nutrition. P:   Renal diet oral  HEMATOLOGIC A:   Anemia of chronic disease, also has positive stool guaiac >> Transfused 8/9. P:  Continue darbopoietin No heparin for now until we are sure there is GI bleed, monitor Hb Transfused for hgb <7.0  INFECTIOUS  A:  PNA, UTI.  - culture negative as of 09/14/13 P:   BCx2 >> UC >> Sputum none Abx: Got zosyn + vanco in ED x 1 Rocephin 8/8>>  ENDOCRINE A:   No active issue P:   Monitor blood sugar  NEUROLOGIC A:   Known neuromyelitis optica P:   Monitor clinically  MISC A right eye conjuctivitis P cipro eye drops  TODAY'S SUMMARY: admitted for acute renal failure with metabolic acidosis, possible UTI. LLL opacity .  Improving renal failure. Rx pna and uti with ceftriaxone. Can go back to [facility]; will check on bed availability.  Sister updated.  Dr. Kalman Shan, M.D., [facility].C.P Pulmonary and Critical Care Medicine Staff Physician [facility] Pager: [phone], If no answer or between  15:00h - 7:00h: call [phone]  09/14/2013 2:06 PM

## 2013-09-14 NOTE — Progress Notes (Signed)
Subjective: Interval History: has no complaint .  Objective: Vital signs in last 24 hours: Temp:  [98.6 F (37 C)-99 F (37.2 C)] 98.9 F (37.2 C) (08/10 0425) Pulse Rate:  [55-99] 95 (08/10 0700) Resp:  [0-27] 20 (08/10 0700) BP: (135-196)/(60-102) 173/73 mmHg (08/10 0700) SpO2:  [100 %] 100 % (08/10 0700) FiO2 (%):  [30 %] 30 % (08/10 0346) Weight:  [60.2 kg (132 lb 11.5 oz)] 60.2 kg (132 lb 11.5 oz) (08/10 0500) Weight change: 2.8 kg (6 lb 2.8 oz)  Intake/Output from previous day: 08/09 0701 - 08/10 0700 In: 2718 [P.O.:318; I.V.:2350; IV Piggyback:50] Out: 690 [Urine:690] Intake/Output this shift:    General appearance: cooperative, moderately obese and edematous, facial edema, coop, trach , on vent Resp: rales bibasilar and rhonchi bilaterally Cardio: S1, S2 normal and systolic murmur: holosystolic 2/6, blowing at apex GI: pos bs, soft Extremities: edema 3-4+  Lab Results:  Recent Labs  09/13/13 0300 09/14/13 0500  WBC 5.7 5.3  HGB 6.9* 8.0*  HCT 21.2* 23.6*  PLT 167 132*   BMET:  Recent Labs  09/13/13 0300 09/14/13 0500  NA 138 141  K 3.8 3.2*  CL 110 106  CO2 13* 21  GLUCOSE 87 89  BUN 46* 44*  CREATININE 3.65* 3.75*  CALCIUM 7.5* 7.4*   No results found for this basename: PTH,  in the last 72 hours Iron Studies:  Recent Labs  09/13/13 0806  IRON 79  TIBC 99*  FERRITIN 625*    Studies/Results: [place] Renal  09/12/2013   CLINICAL DATA:  Acute renal failure, urinary tract infection  EXAM: RENAL/URINARY TRACT ULTRASOUND COMPLETE  COMPARISON:  None.  FINDINGS: Right Kidney:  Length: 11.0 cm. Kidney is mildly to moderately echogenic. There is mild prominence of the intrarenal collecting system and renal pelvis.  Left Kidney:  Length: 10.2 cm. Mild to moderate increased echogenicity. 6 mm upper pole stone. 12 mm midpole stone in the region of the renal pelvis.  Bladder:  Bladder wall thickened to 11 mm  Small volume of ascites in the pelvis.  Left pleural  effusion.  IMPRESSION: 1. Medical renal disease 2. Nonobstructing left renal calculi 3. Mild right hydronephrosis 4. Bladder wall thickening appears diffuse and suggests infiltrative abnormality of the bladder wall possibly indicating cystitis.   Electronically Signed   By: Esperanza Heiraymond  Rubner M.D.   On: 09/12/2013 23:47   Dg Chest Port 1 View  09/13/2013   CLINICAL DATA:  Left lower lobe pulmonary consolidation.  EXAM: PORTABLE CHEST - 1 VIEW  COMPARISON:  09/12/2013  FINDINGS: Stable dense consolidation of the left lower lobe likely representing pneumonia. There is associated adjacent small to moderate-sized pleural effusion. No pulmonary edema. Stable positioning of PICC line and tracheostomy tube. The heart size remains normal.  IMPRESSION: Stable dense consolidation of the left lower lobe likely representing pneumonia. Small to moderate adjacent parapneumonic effusion.   Electronically Signed   By: Irish LackGlenn  Yamagata M.D.   On: 09/13/2013 07:28   Dg Chest Portable 1 View  09/12/2013   CLINICAL DATA:  PICC placement.  EXAM: PORTABLE CHEST - 1 VIEW  COMPARISON:  12/02/2011.  FINDINGS: LEFT lower lobe atelectasis, effusion, and possible consolidation. Tracheostomy. Cardiomegaly.  The PICC line from RIGHT arm approach has its tip at the cavoatrial junction in good position. No pneumothorax. RIGHT lung clear.  Worsening aeration from priors.  IMPRESSION: PICC line from RIGHT arm approach lies with its tip at the cavoatrial junction. LEFT lower lobe process as  described.   Electronically Signed   By: Davonna Belling M.D.   On: 09/12/2013 17:14    I have reviewed the patient's current medications.  Assessment/Plan: 1  CKD 3-4, AKI  .  ? Etiology.  Suspect obstruction, ? Related to bladder outlet vs infx in urine.  With RTA suspect has infx, not just primary renal.  If not better in 2-3 d suspect will need cysto.  Can use Na bicarb tabs. 2 Anemia use epo, Fe ok 3 UTI yeast and other,   4 HTN  ^ Amlodipine 5 MS with  neuromyelitis, contractures, vent dependent,  6 Pneu on AB P  EPO, K, cultures, foley.  Follow Cr, po Na bicarb    LOS: 2 days   Kaitrin Seybold L 09/14/2013,7:23 AM

## 2013-09-14 NOTE — Discharge Summary (Signed)
Physician Discharge Summary       Patient ID: [name] MRN: [id number] DOB/AGE: 64/02/1950 64 y.o.  Admit date: 09/12/2013 Discharge date: 09/17/2013  Discharge Diagnoses:  chronic respiratory failure  tracheotomy status VAP/HCAP (NOS)  HTN, CAD, hx of cardiomyopathy  Right Hydronephrosis  AKI  Metabolic acidosis.  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 She lives in [facility] for the past 4 years. She was sent here due to abnormal labs reviewed patient had a bump in her creatinine up to 4.5 (last known 1.6 on MAy 2015). She also had decreased CO2 levels at 10. Hemoglobin 7.5 with positive stool guaiac. Apparently she also has generalized edema and was sent here for evaluation. In the ED she has findings consistent with UTI, possible HCAP/VAP, also has a renal failure with severe metabolic acidosis with resp compensation. She wears a diaper usually. Also known to have sacral decubitus ulcer. Her renal US showed non-obstructive left renal calculi, mild hydronephrosis on right, probable cystitis. Nephrology was consulted. She was treated empirically w/ IV antibiotics and IVFs. Also given bicarbonate supplementation. Urine culture from 8/8 grew out BOTH pseudomonas aeruginosa and providcia stuartii >100K. The Providencia was multidrug resistant. The Pseudomonas was sensitive only to imipenem and gentamycin and tobramycin. These results were finalized on 8/12 and we started her on Primaxin because of this. At time of d/c her creatinine had improved, acidosis better, and seems to be close to baseline, but we were  concerned about the finding of her right hydronephrosis and the fact that we could not fully explain the etiology of her renal failure. Because of this we consulted Urology after discussing this with the consulting nephrology team. Urology saw the pt in consult on 8/11. Their recommendations were: to go forward with right percutaneous nephrostomy tube. This was carried out on 8/11. Her creatinine had continued to improve some but not significantly and because she had known ureteral stones on the left Urology felt she would also benefit from left nephrostomy tube which she would need anyway for removal of renal and ureteral stones. Nephrology was in agreement with this and left nephrostomy tube was placed 8/12. She was monitored further and creatinine continued to improve. She was deemed ready for d/c as of 8/13 to the LTAC setting with the plan as outlined below. The final recs from   Discharge Plan by active problems   chronic respiratory failure  tracheotomy status VAP/HCAP (NOS) Plan:  Back to [facility] on full vent support Cont primaxin x 9 more days   HTN, CAD, hx of cardiomyopathy  Plan:  Continue BP meds   AKI >> Creatine improved  09/17/13  Metabolic acidosis resolved Obstructive Uropathy s/p bilateral percutaneous nephrostomy tubes   Right tube placed 8/11, left placed 8/12  Plan:  -Leave perc drains in place until cystoscopy/stone removal AND urine out-put clear.  -need to arrange scheduling for the left percutaneous nephrolithotomy at [name] . She could potentially have the right nephrostomy removed, but would suggest a nephrostogram prior to removal in IR. Urology believes she would benefit from suprapubic tube placement as well and that could be potentially be done at the time  of the percutaneous nephrolithotomy. Dr Annabell Howells said he would set this up to be done at [facility] long as out-pt visit, when his schedule would allow.  If this is not arranged by the time she  completes antibiotics please call Dr Belva Crome office [phone] [facility] URology).    Anemia of chronic disease, also has positive stool guaiac >> Transfused 8/9.  Plan:  Continue darbopoietin  No heparin for now until we are sure there is GI bleed, monitor Hb  Transfused for hgb <7.0   UTI. pseudomonas aeruginosa and providcia stuartii   Plan:  Cont Primaxin X 10 days (needs 9 more days)  Known neuromyelitis optica  Plan:  Monitor clinically   right eye conjuctivitis  Plan  cipro eye drops  Significant Hospital tests/ studies  Consults: nephrology & IR.   STUDIES:  8/08 Renal u/s: 1. Medical renal disease 2. Nonobstructing left renal calculi 3. Mild right hydronephrosis  4. Bladder wall thickening appears diffuse and suggests infiltrative abnormality of the bladder wall possibly indicating cystitis.  Discharge Exam: BP 144/66  Pulse 86  Temp(Src) 99.6 F (37.6 C) (Oral)  Resp 22  Ht 5' (1.524 m)  Wt 61 kg (134 lb 7.7 oz)  BMI 26.26 kg/m2  SpO2 100%  General: Chronic critically ill  Neuro: Awake on vent, speaking to me  MSK: multiple contractures in ext  HEENT: trach OK  PULM: CTA B  CV: RRR no mgr  Ab: BS+, soft  Ext: trace edema    Labs at discharge Lab Results  Component Value Date   CREATININE 3.29* 09/17/2013   BUN 36* 09/17/2013   NA 144 09/17/2013   K 3.4* 09/17/2013   CL 109 09/17/2013   CO2 21 09/17/2013   Lab Results  Component Value Date   WBC 6.7 09/17/2013   HGB 7.4* 09/17/2013   HCT 23.3* 09/17/2013   MCV 93.6 09/17/2013   PLT 129* 09/17/2013   Lab Results  Component Value Date   ALT 8 09/15/2013   AST 14 09/15/2013   ALKPHOS 72 09/15/2013   BILITOT 0.4 09/15/2013   Lab Results  Component Value Date   INR 1.12 09/15/2013    Current radiology studies Ir Perc Nephrostomy Left  09/16/2013   CLINICAL DATA:  Renal failure and status post right percutaneous nephrostomy tube placement yesterday. The left kidney contains calculi including a  ureteral calculus and request has been made to now place a nephrostomy tube on the left side.  EXAM: 1. ULTRASOUND GUIDANCE FOR PUNCTURE OF THE LEFT RENAL COLLECTING SYSTEM. 2. LEFT PERCUTANEOUS NEPHROSTOMY TUBE PLACEMENT.  COMPARISON:  CT on 09/14/2013.  ANESTHESIA/SEDATION: 3.0 mg IV Versed; 100 mcg IV Fentanyl.  Total Moderate Sedation Time  25 minutes  CONTRAST:  Twenty ml Omnipaque 300  MEDICATIONS: No additional medications.  FLUOROSCOPY TIME:  3 minutes and 30 seconds.  PROCEDURE: The procedure, risks, benefits, and alternatives were explained to the patient. Questions regarding the procedure were encouraged and answered. The patient understands and consents to the procedure.  The left flank region was prepped with Betadine in a sterile fashion, and a sterile drape was applied covering the operative field. A sterile gown and sterile gloves were used for the procedure. Local anesthesia was provided with 1% Lidocaine.  Ultrasound was used to localize the left kidney. Under direct ultrasound guidance, a 21 gauge needle was advanced into the renal collecting system. Ultrasound image documentation was performed. Aspiration of urine sample was performed followed by contrast injection.  A transitional  dilator was advanced over a guidewire. Percutaneous tract dilatation was then performed over the guidewire. A 10 -French percutaneous nephrostomy tube was then advanced and formed in the collecting system. Catheter position was confirmed by fluoroscopy after contrast injection.  The catheter was secured at the skin with a Prolene retention suture and Stat-Lock device. A gravity bag was placed.  COMPLICATIONS: None.  FINDINGS: Ultrasound shows mild left hydronephrosis. Initial upper pole access was performed with contrast injection under fluoroscopy. The nephrostomy tube was ultimately placed via lower pole access. There was some hemorrhage in the collecting system after tube placement. The tube was advanced into the  renal pelvis with the tip of the catheter extending just into the proximal ureter.  IMPRESSION: Successful placement of left percutaneous nephrostomy. This will be left to gravity drainage.   Electronically Signed   By: Irish LackGlenn  Yamagata M.D.   On: 09/16/2013 17:16   Ir Perc Nephrostomy Right  09/15/2013   CLINICAL DATA:  Acute renal failure with mild right hydronephrosis and left renal stones.  EXAM: RIGHT PERCUTANEOUS NEPHROSTOMY TUBE WITH ULTRASOUND AND FLUOROSCOPIC GUIDANCE  Physician: Rachelle HoraAdam R. Henn, MD  FLUOROSCOPY TIME:  36 min and 48 seconds  MEDICATIONS: 4 mg versed and 200 mcg fentanyl. A radiology nurse monitored the patient for moderate sedation.  ANESTHESIA/SEDATION: Moderate sedation time: 1 hr and 50 min  PROCEDURE: Informed consent was obtained for a right percutaneous nephrostomy tube placement. The patient was placed on her left side. The right flank was evaluated with ultrasound. The right kidney was identified. The right flank was prepped and draped in sterile fashion. Maximal barrier sterile technique was utilized including caps, mask, sterile gowns, sterile gloves, sterile drape, hand hygiene and skin antiseptic. 1% lidocaine was used for local anesthetic. A 22 gauge needle was directed into the mid pole towards a calyx. Contrast injection demonstrated filling of the renal collecting system. An Accustick dilator set was placed. Additional contrast injections were performed. The catheter appeared to be entering into the renal pelvis rather than a calyx, therefore, planned for new access. Unable to cannulate an upper pole calyx with fluoroscopic guidance. Eventually, a lower pole calyx was targeted. A 22 gauge needle was directed into a lower calyx and a wire was successfully advanced into the renal collecting system. An Accustick dilator set was placed. At this point, it was very difficult to get a wire to advance into the renal pelvis. There appeared to be narrowing or stenosis in the lower pole  infundibulum. A wire perforated the collecting system on a few occasions and a small amount of contrast extravasation was noted. After using a variety of wires and placing a 5 [place]French Kumpe catheter, a Glidewire was eventually advanced into the renal pelvis and proximal ureter. Catheter was advanced over the wire and a Bentson wire was placed. The tract was dilated and a 10 JamaicaFrench drain was placed. Regular sized multipurpose drain appeared to be too large for the renal pelvis. As result, Renee Painawson Mueller 10 French drain was placed. Catheter was reconstituted in the renal pelvis. Catheter was sutured to the skin and attached to gravity bag.  FINDINGS: Mild right hydronephrosis. Hydronephrosis was slightly more prominent in the lower pole collecting system. Access was obtained from a lower pole calyx but the right lower pole infundibulum is either stenotic or very torturous. It was very difficult to cannulate the renal pelvis from the lower pole access. Eventually, catheter and wire were advanced to the renal pelvis and ureter. 10 Jamaica[place] drain  reconstituted in the renal pelvis. Filling defects in the renal pelvis at the end of the study are consistent with blood clot. Contrast was draining through the ureter and contrast was identified in the bladder.  COMPLICATIONS: None  IMPRESSION: Successful placement of a right percutaneous nephrostomy tube with ultrasound and fluoroscopic guidance. The procedure was technically difficult due to the mild hydronephrosis and apparent narrowing in the right lower pole infundibulum.   Electronically Signed   By: Richarda Overlie M.D.   On: 09/15/2013 17:43   Ir US Guide Bx Asp/drain  09/16/2013   CLINICAL DATA:  Renal failure and status post right percutaneous nephrostomy tube placement yesterday. The left kidney contains calculi including a ureteral calculus and request has been made to now place a nephrostomy tube on the left side.  EXAM: 1. ULTRASOUND GUIDANCE FOR PUNCTURE OF THE LEFT  RENAL COLLECTING SYSTEM. 2. LEFT PERCUTANEOUS NEPHROSTOMY TUBE PLACEMENT.  COMPARISON:  CT on 09/14/2013.  ANESTHESIA/SEDATION: 3.0 mg IV Versed; 100 mcg IV Fentanyl.  Total Moderate Sedation Time  25 minutes  CONTRAST:  Twenty ml Omnipaque 300  MEDICATIONS: No additional medications.  FLUOROSCOPY TIME:  3 minutes and 30 seconds.  PROCEDURE: The procedure, risks, benefits, and alternatives were explained to the patient. Questions regarding the procedure were encouraged and answered. The patient understands and consents to the procedure.  The left flank region was prepped with Betadine in a sterile fashion, and a sterile drape was applied covering the operative field. A sterile gown and sterile gloves were used for the procedure. Local anesthesia was provided with 1% Lidocaine.  Ultrasound was used to localize the left kidney. Under direct ultrasound guidance, a 21 gauge needle was advanced into the renal collecting system. Ultrasound image documentation was performed. Aspiration of urine sample was performed followed by contrast injection.  A transitional dilator was advanced over a guidewire. Percutaneous tract dilatation was then performed over the guidewire. A 10 -French percutaneous nephrostomy tube was then advanced and formed in the collecting system. Catheter position was confirmed by fluoroscopy after contrast injection.  The catheter was secured at the skin with a Prolene retention suture and Stat-Lock device. A gravity bag was placed.  COMPLICATIONS: None.  FINDINGS: Ultrasound shows mild left hydronephrosis. Initial upper pole access was performed with contrast injection under fluoroscopy. The nephrostomy tube was ultimately placed via lower pole access. There was some hemorrhage in the collecting system after tube placement. The tube was advanced into the renal pelvis with the tip of the catheter extending just into the proximal ureter.  IMPRESSION: Successful placement of left percutaneous nephrostomy.  This will be left to gravity drainage.   Electronically Signed   By: Irish Lack M.D.   On: 09/16/2013 17:16   Ir US Guide Bx Asp/drain  09/15/2013   CLINICAL DATA:  Acute renal failure with mild right hydronephrosis and left renal stones.  EXAM: RIGHT PERCUTANEOUS NEPHROSTOMY TUBE WITH ULTRASOUND AND FLUOROSCOPIC GUIDANCE  Physician: Rachelle Hora. Henn, MD  FLUOROSCOPY TIME:  36 min and 48 seconds  MEDICATIONS: 4 mg versed and 200 mcg fentanyl. A radiology nurse monitored the patient for moderate sedation.  ANESTHESIA/SEDATION: Moderate sedation time: 1 hr and 50 min  PROCEDURE: Informed consent was obtained for a right percutaneous nephrostomy tube placement. The patient was placed on her left side. The right flank was evaluated with ultrasound. The right kidney was identified. The right flank was prepped and draped in sterile fashion. Maximal barrier sterile technique was utilized including caps, mask, sterile  gowns, sterile gloves, sterile drape, hand hygiene and skin antiseptic. 1% lidocaine was used for local anesthetic. A 22 gauge needle was directed into the mid pole towards a calyx. Contrast injection demonstrated filling of the renal collecting system. An Accustick dilator set was placed. Additional contrast injections were performed. The catheter appeared to be entering into the renal pelvis rather than a calyx, therefore, planned for new access. Unable to cannulate an upper pole calyx with fluoroscopic guidance. Eventually, a lower pole calyx was targeted. A 22 gauge needle was directed into a lower calyx and a wire was successfully advanced into the renal collecting system. An Accustick dilator set was placed. At this point, it was very difficult to get a wire to advance into the renal pelvis. There appeared to be narrowing or stenosis in the lower pole infundibulum. A wire perforated the collecting system on a few occasions and a small amount of contrast extravasation was noted. After using a variety  of wires and placing a 5 [place] Kumpe catheter, a Glidewire was eventually advanced into the renal pelvis and proximal ureter. Catheter was advanced over the wire and a Bentson wire was placed. The tract was dilated and a 10 [place] drain was placed. Regular sized multipurpose drain appeared to be too large for the renal pelvis. As result, Renee Pain 10 French drain was placed. Catheter was reconstituted in the renal pelvis. Catheter was sutured to the skin and attached to gravity bag.  FINDINGS: Mild right hydronephrosis. Hydronephrosis was slightly more prominent in the lower pole collecting system. Access was obtained from a lower pole calyx but the right lower pole infundibulum is either stenotic or very torturous. It was very difficult to cannulate the renal pelvis from the lower pole access. Eventually, catheter and wire were advanced to the renal pelvis and ureter. 10 French drain reconstituted in the renal pelvis. Filling defects in the renal pelvis at the end of the study are consistent with blood clot. Contrast was draining through the ureter and contrast was identified in the bladder.  COMPLICATIONS: None  IMPRESSION: Successful placement of a right percutaneous nephrostomy tube with ultrasound and fluoroscopic guidance. The procedure was technically difficult due to the mild hydronephrosis and apparent narrowing in the right lower pole infundibulum.   Electronically Signed   By: Richarda Overlie M.D.   On: 09/15/2013 17:43    Disposition:  Final discharge disposition not confirmed      Discharge Instructions   Diet - low sodium heart healthy    Complete by:  As directed   Renal diet     Increase activity slowly    Complete by:  As directed             Medication List    STOP taking these medications       LEVAQUIN 500 MG/100ML Soln  Generic drug:  levofloxacin     magnesium hydroxide 400 MG/5ML suspension  Commonly known as:  MILK OF MAGNESIA      TAKE these medications        acetaminophen 325 MG tablet  Commonly known as:  TYLENOL  Take 650 mg by mouth every 6 (six) hours as needed for moderate pain.     amLODipine 10 MG tablet  Commonly known as:  NORVASC  Take 10 mg by mouth daily.     chlorhexidine 0.12 % solution  Commonly known as:  PERIDEX  Use as directed 15 mLs in the mouth or throat 2 (two) times daily.  ciprofloxacin 0.3 % ophthalmic solution  Commonly known as:  CILOXAN  Place 2 drops into the right eye 4 (four) times daily. Administer 1 drop, every 2 hours, while awake, for 2 days. Then 1 drop, every 4 hours, while awake, for the next 5 days.     cloNIDine 0.1 MG tablet  Commonly known as:  CATAPRES  Take 0.1 mg by mouth every 6 (six) hours as needed (for hypertension for systolic blood pressure greater than 160).     darbepoetin 40 MCG/0.4ML Soln injection  Commonly known as:  ARANESP  Inject 40 mcg into the skin every 7 (seven) days. On Monday     DSS 100 MG Caps  Take 100 mg by mouth 2 (two) times daily.     DULoxetine 30 MG capsule  Commonly known as:  CYMBALTA  Take 30 mg by mouth daily.     ferrous sulfate 325 (65 FE) MG tablet  Take 325 mg by mouth 2 (two) times daily.     hydrALAZINE 100 MG tablet  Commonly known as:  APRESOLINE  Take 100 mg by mouth 3 (three) times daily.     imipenem-cilastatin 250 mg in sodium chloride 0.9 % 100 mL  Inject 250 mg into the vein every 12 (twelve) hours.     labetalol 300 MG tablet  Commonly known as:  NORMODYNE  Take 300 mg by mouth 3 (three) times daily.     levETIRAcetam 250 MG tablet  Commonly known as:  KEPPRA  Take 250 mg by mouth at bedtime.     levETIRAcetam 500 MG tablet  Commonly known as:  KEPPRA  Take 500 mg by mouth daily.     MULTIVITAMIN & MINERAL PO  Take 1 tablet by mouth daily.     mupirocin ointment 2 %  Commonly known as:  BACTROBAN  Place 1 application into the nose as needed (for right toe infection).     omeprazole 20 MG capsule  Commonly known as:   PRILOSEC  Take 20 mg by mouth daily.     promethazine 25 MG tablet  Commonly known as:  PHENERGAN  Take 12.5 mg by mouth every 6 (six) hours as needed for nausea or vomiting.     simethicone 80 MG chewable tablet  Commonly known as:  MYLICON  Chew 80 mg by mouth every 6 (six) hours as needed for flatulence.     sodium bicarbonate 650 MG tablet  Take 2 tablets (1,300 mg total) by mouth 2 (two) times daily.     sodium chloride 0.9 % infusion  Inject 250 mLs into the vein as needed (if IV carrier fluid needed.).     tiZANidine 2 MG tablet  Commonly known as:  ZANAFLEX  Take 2 mg by mouth 3 (three) times daily.         Discharged Condition: stable  Physician Statement:   The Patient was personally examined, the discharge assessment and plan has been personally reviewed and I agree with ACNP Jordy Verba's assessment and plan. > 30 minutes of time have been dedicated to discharge assessment, planning and discharge instructions.   Signed: Jabre Heo,PETE 09/17/2013, 3:35 PM

## 2013-09-14 NOTE — Progress Notes (Signed)
Patient ID: [name], female   DOB: July 22, 1950, 63 y.o.   MRN: [id number]   The CT has artifact from her immobile arms but it shows several left renal stones with a 55mm renal pelvic stone and a 69mm left proximal ureteral stone.  There is minimal obstruction on the left.   On there right there is some apparent dilation but a dense stone is not seen.  There is a faint density in the area of the proximal ureter that could be some minimally calcified matrix type stone material.  The foley tip was in the bladder but the balloon appeared to be vaginal and the bladder was not completely empty.     I had her nurse reposition the catheter with return of about of concentrated urine.     I attempted a pelvic exam to assess the foley position and found her to have immobile hips which made the exam difficult.   I was able to do a digital vaginal exam and found her urethra to be somewhat patulous but not completely eroded.  The foley was in good position.     Imp:   She has mild hydro on the right possibly from matrix stone material and she has a 47mm left proximal stone and 5mm left UPJ stone with minimal obstruction.  Rec:   Based on her hip immobility, I am very concerned that cystoscopic access might be difficult to impossible and I would be best in my opinion to consider percutaneous drainage bilaterally.   It might be best to start with the right side and see how she does since that side appears more dilated, but eventually she will need percutaneous management of the left renal and ureteral stones since a cystoscopic approach will be problematic.  I have  discussed this with Dr. Darrick Penna with [facility] and I am going to order a right percutaneous nephrostomy and then depending on her response consider a left perc at a later date since despite the stones she has minimal obstruction and the right side was the symptomatic side.

## 2013-09-15 ENCOUNTER — Inpatient Hospital Stay (HOSPITAL_COMMUNITY): Payer: Medicare Other

## 2013-09-15 LAB — CBC
HEMATOCRIT: 22.9 % — AB (ref 36.0–46.0)
Hemoglobin: 7.8 g/dL — ABNORMAL LOW (ref 12.0–15.0)
MCH: 30 pg (ref 26.0–34.0)
MCHC: 34.1 g/dL (ref 30.0–36.0)
MCV: 88.1 fL (ref 78.0–100.0)
PLATELETS: 135 10*3/uL — AB (ref 150–400)
RBC: 2.6 MIL/uL — ABNORMAL LOW (ref 3.87–5.11)
RDW: 17.6 % — ABNORMAL HIGH (ref 11.5–15.5)
WBC: 5.8 10*3/uL (ref 4.0–10.5)

## 2013-09-15 LAB — PROTEIN ELECTROPHORESIS, SERUM
Albumin ELP: 48.8 % — ABNORMAL LOW (ref 55.8–66.1)
Alpha-1-Globulin: 5.9 % — ABNORMAL HIGH (ref 2.9–4.9)
Alpha-2-Globulin: 10.2 % (ref 7.1–11.8)
Beta 2: 6.8 % — ABNORMAL HIGH (ref 3.2–6.5)
Beta Globulin: 3.7 % — ABNORMAL LOW (ref 4.7–7.2)
Gamma Globulin: 24.6 % — ABNORMAL HIGH (ref 11.1–18.8)
M-SPIKE, %: NOT DETECTED g/dL
Total Protein ELP: 5.3 g/dL — ABNORMAL LOW (ref 6.0–8.3)

## 2013-09-15 LAB — COMPREHENSIVE METABOLIC PANEL
ALBUMIN: 1.7 g/dL — AB (ref 3.5–5.2)
ALT: 8 U/L (ref 0–35)
ANION GAP: 13 (ref 5–15)
AST: 14 U/L (ref 0–37)
Alkaline Phosphatase: 72 U/L (ref 39–117)
BILIRUBIN TOTAL: 0.4 mg/dL (ref 0.3–1.2)
BUN: 41 mg/dL — AB (ref 6–23)
CHLORIDE: 106 meq/L (ref 96–112)
CO2: 22 mEq/L (ref 19–32)
CREATININE: 3.51 mg/dL — AB (ref 0.50–1.10)
Calcium: 7.4 mg/dL — ABNORMAL LOW (ref 8.4–10.5)
GFR calc Af Amer: 15 mL/min — ABNORMAL LOW (ref >90)
GFR calc non Af Amer: 13 mL/min — ABNORMAL LOW (ref >90)
GLUCOSE: 84 mg/dL (ref 70–99)
POTASSIUM: 3.5 meq/L — AB (ref 3.7–5.3)
Sodium: 141 mEq/L (ref 137–147)
Total Protein: 5 g/dL — ABNORMAL LOW (ref 6.0–8.3)

## 2013-09-15 LAB — PROTIME-INR
INR: 1.12 (ref 0.00–1.49)
Prothrombin Time: 14.4 seconds (ref 11.6–15.2)

## 2013-09-15 LAB — MAGNESIUM: Magnesium: 1.6 mg/dL (ref 1.5–2.5)

## 2013-09-15 LAB — PHOSPHORUS: Phosphorus: 4.1 mg/dL (ref 2.3–4.6)

## 2013-09-15 LAB — APTT: aPTT: 32 seconds (ref 24–37)

## 2013-09-15 MED ORDER — FENTANYL CITRATE 0.05 MG/ML IJ SOLN
INTRAMUSCULAR | Status: AC
  Filled 2013-09-15: qty 2

## 2013-09-15 MED ORDER — MIDAZOLAM HCL 2 MG/2ML IJ SOLN
INTRAMUSCULAR | Status: AC
  Filled 2013-09-15: qty 2

## 2013-09-15 MED ORDER — MAGNESIUM SULFATE 40 MG/ML IJ SOLN
2.0000 g | Freq: Once | INTRAMUSCULAR | Status: AC
  Administered 2013-09-15: 2 g via INTRAVENOUS
  Filled 2013-09-15: qty 50

## 2013-09-15 MED ORDER — LIDOCAINE HCL 1 % IJ SOLN
INTRAMUSCULAR | Status: AC
  Filled 2013-09-15: qty 20

## 2013-09-15 MED ORDER — POTASSIUM CHLORIDE CRYS ER 20 MEQ PO TBCR
40.0000 meq | EXTENDED_RELEASE_TABLET | Freq: Once | ORAL | Status: AC
  Administered 2013-09-15: 40 meq via ORAL
  Filled 2013-09-15: qty 2

## 2013-09-15 MED ORDER — FUROSEMIDE 80 MG PO TABS
160.0000 mg | ORAL_TABLET | Freq: Once | ORAL | Status: AC
  Administered 2013-09-15: 160 mg via ORAL
  Filled 2013-09-15: qty 2

## 2013-09-15 MED ORDER — IOHEXOL 300 MG/ML  SOLN
50.0000 mL | Freq: Once | INTRAMUSCULAR | Status: AC | PRN
  Administered 2013-09-15: 1 mL

## 2013-09-15 MED ORDER — FENTANYL CITRATE 0.05 MG/ML IJ SOLN
INTRAMUSCULAR | Status: AC | PRN
  Administered 2013-09-15: 25 ug via INTRAVENOUS
  Administered 2013-09-15 (×3): 50 ug via INTRAVENOUS
  Administered 2013-09-15: 25 ug via INTRAVENOUS

## 2013-09-15 MED ORDER — CEFAZOLIN SODIUM-DEXTROSE 2-3 GM-% IV SOLR
2.0000 g | Freq: Once | INTRAVENOUS | Status: AC
  Administered 2013-09-15: 2 g via INTRAVENOUS
  Filled 2013-09-15: qty 50

## 2013-09-15 MED ORDER — MIDAZOLAM HCL 2 MG/2ML IJ SOLN
INTRAMUSCULAR | Status: AC | PRN
  Administered 2013-09-15 (×4): 1 mg via INTRAVENOUS

## 2013-09-15 NOTE — Sedation Documentation (Signed)
Successful Nephro tube placed

## 2013-09-15 NOTE — Care Management Note (Signed)
    Page 1 of 1   09/17/2013     3:50:29 PM CARE MANAGEMENT NOTE 09/17/2013  Patient:  [name] [name], [name] [name]   Account Number:  [id number]  Date Initiated:  09/13/2013  Documentation initiated by:  [facility]  Subjective/Objective Assessment:   Admitted with worsening renal function, anemia and resp failure - chronic vent.     In-house referral  Clinical Water quality scientist  CM consult       Status of service:  Completed, signed off Medicare Important Message given?  YES (If response is "NO", the following Medicare IM given date fields will be blank) Date Medicare IM given:  09/17/2013 Medicare IM given by:  Giannamarie Paulus Date Additional Medicare IM given:   Additional Medicare IM given by:    Discharge Disposition:  LONG TERM ACUTE CARE (LTAC)  Per UR Regulation:  Reviewed for med. necessity/level of care/duration of stay  If discussed at Long Length of Stay Meetings, dates discussed:   09/17/2013    Comments:  Contact:  [name],[name] Sister [phone]  09/17/13 1052 [name] Hendriks RN MSN BSN CCM Pt stable for transfer back to [facility] vent [facility]F - notified CSW who will call admissions coordinator. 1100 Per CSW, no SNF beds today. 1522 Pt appropriate for [facility] per NP and admissions coordinator.  TC to [name] [name], sister,  who agrees to transfer today.  09/15/13 [name] Ace, RN, BSN [phone] Pt from [facility] prior to admission.  Plan to transfer to SDU today.

## 2013-09-15 NOTE — H&P (Signed)
[name] is an 63 y.o. female.   Chief Complaint: pt admitted to [facility] hospital from [place] with UTI sxs; poss pneumonia Has been resident at [place] x 4 yrs Neuromyelitis optica; all extremities contractures Has indwelling foley catheter Work up reveals mild/mod Rt hydronephrosis and B renal stones Uro Dr Jeffie Pollock has seen pt and has requested R Percutaneous nephrostomy Feels pt will be difficult for cystoscopy with contractures Dr Barbie Banner has seen and examined pt; reviewed chart and films Now scheduled for R PCN  HPI: Neuromyelitis optica; Blind; hemiparesis (L); HTN; CAD; trach/vent; Sz; anemia  Past Medical History  Diagnosis Date  . Neuromyelitis optica   . Blindness   . Hemiparesis     left sided  . Hypertension   . Cardiomyopathy   . Coronary artery disease   . Ventilator dependent   . Respiratory failure   . Depression   . Anemia   . Seizures     Past Surgical History  Procedure Laterality Date  . Tracheostomy      No family history on file. Social History:  reports that she has never smoked. She does not have any smokeless tobacco history on file. She reports that she does not drink alcohol or use illicit drugs.  Allergies:  Allergies  Allergen Reactions  . Neurontin [Gabapentin] Other (See Comments)    Per MAR, unknown  . Xanax [Alprazolam] Other (See Comments)    Per MAR, unknown    Medications Prior to Admission  Medication Sig Dispense Refill  . acetaminophen (TYLENOL) 325 MG tablet Take 650 mg by mouth every 6 (six) hours as needed for moderate pain.      Marland Kitchen amLODipine (NORVASC) 10 MG tablet Take 10 mg by mouth daily.      . chlorhexidine (PERIDEX) 0.12 % solution Use as directed 15 mLs in the mouth or throat 2 (two) times daily.      . cloNIDine (CATAPRES) 0.1 MG tablet Take 0.1 mg by mouth every 6 (six) hours as needed (for hypertension for systolic blood pressure greater than 160).      . darbepoetin (ARANESP) 40 MCG/0.4ML SOLN injection  Inject 40 mcg into the skin every 7 (seven) days. On Monday      . DULoxetine (CYMBALTA) 30 MG capsule Take 30 mg by mouth daily.      . ferrous sulfate 325 (65 FE) MG tablet Take 325 mg by mouth 2 (two) times daily.      . hydrALAZINE (APRESOLINE) 100 MG tablet Take 100 mg by mouth 3 (three) times daily.      Marland Kitchen labetalol (NORMODYNE) 300 MG tablet Take 300 mg by mouth 3 (three) times daily.      Marland Kitchen levETIRAcetam (KEPPRA) 250 MG tablet Take 250 mg by mouth at bedtime.      . levETIRAcetam (KEPPRA) 500 MG tablet Take 500 mg by mouth daily.      Marland Kitchen levofloxacin (LEVAQUIN) 500 MG/100ML SOLN Inject 500 mg into the vein every other day.      . magnesium hydroxide (MILK OF MAGNESIA) 400 MG/5ML suspension Take 30 mLs by mouth every 8 (eight) hours as needed (for nausea or vomiting).      . Multiple Vitamins-Minerals (MULTIVITAMIN & MINERAL PO) Take 1 tablet by mouth daily.      . mupirocin ointment (BACTROBAN) 2 % Place 1 application into the nose as needed (for right toe infection).      Marland Kitchen omeprazole (PRILOSEC) 20 MG capsule Take 20 mg by mouth daily.      Marland Kitchen  promethazine (PHENERGAN) 25 MG tablet Take 12.5 mg by mouth every 6 (six) hours as needed for nausea or vomiting.      . simethicone (MYLICON) 80 MG chewable tablet Chew 80 mg by mouth every 6 (six) hours as needed for flatulence.      Marland Kitchen tiZANidine (ZANAFLEX) 2 MG tablet Take 2 mg by mouth 3 (three) times daily.        Results for orders placed during the hospital encounter of 09/12/13 (from the past 48 hour(s))  GLUCOSE, CAPILLARY     Status: Abnormal   Collection Time    09/13/13 11:28 AM      Result Value Ref Range   Glucose-Capillary 116 (*) 70 - 99 mg/dL  PROTEIN / CREATININE RATIO, URINE     Status: Abnormal   Collection Time    09/13/13 11:56 AM      Result Value Ref Range   Creatinine, Urine 95.36     Total Protein, Urine 790.9     Comment: NO NORMAL RANGE ESTABLISHED FOR THIS TEST     RESULTS CONFIRMED BY MANUAL DILUTION   PROTEIN  CREATININE RATIO 8.29 (*) 0.00 - 0.15  SODIUM, URINE, RANDOM     Status: None   Collection Time    09/13/13 11:56 AM      Result Value Ref Range   Sodium, Ur 67    CREATININE, URINE, RANDOM     Status: None   Collection Time    09/13/13 11:56 AM      Result Value Ref Range   Creatinine, Urine 95.17    GLUCOSE, CAPILLARY     Status: Abnormal   Collection Time    09/13/13  3:57 PM      Result Value Ref Range   Glucose-Capillary 102 (*) 70 - 99 mg/dL  GLUCOSE, CAPILLARY     Status: Abnormal   Collection Time    09/13/13  7:55 PM      Result Value Ref Range   Glucose-Capillary 105 (*) 70 - 99 mg/dL  GLUCOSE, CAPILLARY     Status: Abnormal   Collection Time    09/14/13 12:20 AM      Result Value Ref Range   Glucose-Capillary 120 (*) 70 - 99 mg/dL   Comment 1 Notify RN    CBC     Status: Abnormal   Collection Time    09/14/13  5:00 AM      Result Value Ref Range   WBC 5.3  4.0 - 10.5 K/uL   RBC 2.65 (*) 3.87 - 5.11 MIL/uL   Hemoglobin 8.0 (*) 12.0 - 15.0 g/dL   HCT 46.2 (*) 53.1 - 03.3 %   MCV 89.1  78.0 - 100.0 fL   MCH 30.2  26.0 - 34.0 pg   MCHC 33.9  30.0 - 36.0 g/dL   RDW 75.2 (*) 61.6 - 11.9 %   Platelets 132 (*) 150 - 400 K/uL  BASIC METABOLIC PANEL     Status: Abnormal   Collection Time    09/14/13  5:00 AM      Result Value Ref Range   Sodium 141  137 - 147 mEq/L   Potassium 3.2 (*) 3.7 - 5.3 mEq/L   Chloride 106  96 - 112 mEq/L   CO2 21  19 - 32 mEq/L   Glucose, Bld 89  70 - 99 mg/dL   BUN 44 (*) 6 - 23 mg/dL   Creatinine, Ser 6.38 (*) 0.50 - 1.10 mg/dL   Calcium  7.4 (*) 8.4 - 10.5 mg/dL   GFR calc non Af Amer 12 (*) >90 mL/min   GFR calc Af Amer 14 (*) >90 mL/min   Comment: (NOTE)     The eGFR has been calculated using the CKD EPI equation.     This calculation has not been validated in all clinical situations.     eGFR's persistently <90 mL/min signify possible Chronic Kidney     Disease.   Anion gap 14  5 - 15  PHOSPHORUS     Status: None    Collection Time    09/14/13  5:00 AM      Result Value Ref Range   Phosphorus 4.3  2.3 - 4.6 mg/dL  CBC     Status: Abnormal   Collection Time    09/15/13  4:00 AM      Result Value Ref Range   WBC 5.8  4.0 - 10.5 K/uL   RBC 2.60 (*) 3.87 - 5.11 MIL/uL   Hemoglobin 7.8 (*) 12.0 - 15.0 g/dL   HCT 47.1 (*) 85.5 - 01.5 %   MCV 88.1  78.0 - 100.0 fL   MCH 30.0  26.0 - 34.0 pg   MCHC 34.1  30.0 - 36.0 g/dL   RDW 86.8 (*) 25.7 - 49.3 %   Platelets 135 (*) 150 - 400 K/uL  PHOSPHORUS     Status: None   Collection Time    09/15/13  4:00 AM      Result Value Ref Range   Phosphorus 4.1  2.3 - 4.6 mg/dL  COMPREHENSIVE METABOLIC PANEL     Status: Abnormal   Collection Time    09/15/13  4:00 AM      Result Value Ref Range   Sodium 141  137 - 147 mEq/L   Potassium 3.5 (*) 3.7 - 5.3 mEq/L   Chloride 106  96 - 112 mEq/L   CO2 22  19 - 32 mEq/L   Glucose, Bld 84  70 - 99 mg/dL   BUN 41 (*) 6 - 23 mg/dL   Creatinine, Ser 5.52 (*) 0.50 - 1.10 mg/dL   Calcium 7.4 (*) 8.4 - 10.5 mg/dL   Total Protein 5.0 (*) 6.0 - 8.3 g/dL   Albumin 1.7 (*) 3.5 - 5.2 g/dL   AST 14  0 - 37 U/L   ALT 8  0 - 35 U/L   Alkaline Phosphatase 72  39 - 117 U/L   Total Bilirubin 0.4  0.3 - 1.2 mg/dL   GFR calc non Af Amer 13 (*) >90 mL/min   GFR calc Af Amer 15 (*) >90 mL/min   Comment: (NOTE)     The eGFR has been calculated using the CKD EPI equation.     This calculation has not been validated in all clinical situations.     eGFR's persistently <90 mL/min signify possible Chronic Kidney     Disease.   Anion gap 13  5 - 15  MAGNESIUM     Status: None   Collection Time    09/15/13  4:00 AM      Result Value Ref Range   Magnesium 1.6  1.5 - 2.5 mg/dL  PROTIME-INR     Status: None   Collection Time    09/15/13  6:59 AM      Result Value Ref Range   Prothrombin Time 14.4  11.6 - 15.2 seconds   INR 1.12  0.00 - 1.49  APTT     Status: None  Collection Time    09/15/13  6:59 AM      Result Value Ref Range    aPTT 32  24 - 37 seconds   Ct Abdomen Pelvis Wo Contrast  09/14/2013   CLINICAL DATA:  Abdominal pain. Right-sided hydronephrosis noted on recent ultrasound examination.  EXAM: CT ABDOMEN AND PELVIS WITHOUT CONTRAST  TECHNIQUE: Multidetector CT imaging of the abdomen and pelvis was performed following the standard protocol without IV contrast.  COMPARISON:  Abdominal ultrasound 09/12/2013.  FINDINGS: Lung Bases: Central venous catheter tip in the right atrium. Mild cardiomegaly. Small amount of pericardial fluid and/or thickening, unlikely to be of hemodynamic significance at this time. Small right pleural effusion layering dependently. Moderate to large left pleural effusion layering dependently. Complete atelectasis of the visualized portions of the left lower lobe. Thickening of the peribronchovascular interstitium suggesting a background of mild interstitial pulmonary edema.  Abdomen/Pelvis: Image 49 of series 201 demonstrates a 9 mm calculus in the proximal third of the left ureter. This is associated with mild proximal left hydroureteronephrosis and perinephric stranding. There are several other nonobstructive calculi within the left renal collecting system, largest of which measures 13 mm in the interpolar region. Additionally, in the urinary bladder adjacent to the Foley balloon catheter there is a 4 mm calculus. The unenhanced appearance of the right kidney is unremarkable.  Image quality is significantly compromised by a large amount of beam hardening artifact from the patient's arms (the patient was imaged with her arms down). With these limitations in mind, the unenhanced appearance of the liver, gallbladder, pancreas, spleen and bilateral adrenal glands is unremarkable. There is a small volume of ascites. No pneumoperitoneum. No pathologic distention of small bowel. Accurate assessment for lymphadenopathy is not possible on today's noncontrast CT examination secondary to the excessive image noise.  Atherosclerosis throughout the abdominal and pelvic vasculature. Status post hysterectomy. Ovaries are not confidently identified and may be surgically absent or atrophic. Foley balloon catheter with tip in the lumen of the urinary bladder. Small amount of gas in the nondependent portion of the urinary bladder is presumably iatrogenic.  Musculoskeletal: Severe diffuse body wall edema. There are no aggressive appearing lytic or blastic lesions noted in the visualized portions of the skeleton.  IMPRESSION: 1. 9 mm calculus in the proximal third of the left ureter with mild proximal left hydroureteronephrosis and perinephric stranding indicating mild obstruction at this time. 2. Multiple additional nonobstructive calculi within the left renal collecting system measuring up to 13 mm, and a 4 mm calculus in the the urinary bladder, as above. 3. Findings suggestive of anasarca, including mild interstitial pulmonary edema, small pericardial effusion, bilateral pleural effusions, small volume of ascites, and diffuse body wall edema. 4. Complete atelectasis of the visualized portions of the left lower lobe. 5. Mild cardiomegaly. 6. Additional incidental findings, as above.   Electronically Signed   By: Trudie Reed M.D.   On: 09/14/2013 19:27   Dg Chest Port 1 View  09/14/2013   CLINICAL DATA:  Difficulty breathing  EXAM: PORTABLE CHEST - 1 VIEW  COMPARISON:  September 13, 2013  FINDINGS: Tracheostomy position is stable compared to 1 day prior. It is difficult to ascertain the amount of the tracheostomy that is within the trachea given angulation at the tracheostomy site placement. Central catheter tip is in the right atrium. No pneumothorax. There is persistent effusion on the left with left lower lobe consolidation. Right lung is clear. Heart is mildly enlarged. Pulmonary vascularity is within normal limits.  No adenopathy.  IMPRESSION: Tube and catheter positions are unchanged without pneumothorax. Left lower lobe  consolidation with effusion, stable. No new opacity. Right lung clear.   Electronically Signed   By: Lowella Grip M.D.   On: 09/14/2013 07:31    Review of Systems  Constitutional: Positive for weight loss. Negative for fever.  Eyes: Positive for discharge and redness.       Rt eye red; conjunctivitis Blind  Respiratory: Negative for shortness of breath.        Trach/vent  Cardiovascular: Negative for chest pain and leg swelling.  Gastrointestinal: Positive for abdominal pain. Negative for nausea and vomiting.  Musculoskeletal: Positive for back pain, joint pain and neck pain.       + contractures all extremities  Neurological: Positive for weakness. Negative for dizziness and headaches.  Psychiatric/Behavioral: Negative for memory loss and substance abuse.    Blood pressure 172/74, pulse 86, temperature 98.1 F (36.7 C), temperature source Oral, resp. rate 22, height 5' (1.524 m), weight 61.9 kg (136 lb 7.4 oz), SpO2 100.00%. Physical Exam  Constitutional:  Pt with all extr contractures  HENT:  Pt is able to communicate verbally - mouth movements and minimal sound Eats and drinks over trach  Eyes: Right eye exhibits discharge.  Rt eye reddened; conjunctivitis  Cardiovascular: Normal rate and regular rhythm.   No murmur heard. Respiratory: Effort normal. She has no wheezes.  Trach/vent  GI: Soft. Bowel sounds are normal. There is no tenderness.  Genitourinary:  Indwelling foley cath  Musculoskeletal:  Able to move feet; and hands slightly  Neurological: She is alert. Coordination abnormal.  Skin: Skin is warm and dry.  Psychiatric: She has a normal mood and affect. Her behavior is normal. Judgment and thought content normal.  She is able to answer questions appropriately Seems aware  Did consent sister of procedure---consented via phone Dr Barbie Banner also has seen and examined pt     Assessment/Plan [facility] pt  Neuromyelitis optica Admitted with UTI and PNA  sxs Workup shows renal stones- mild Rt hydro Uro has requested Rt PCN placement May need L soon Pt with all 4 extr contractures and will be difficult pt for cystogram/stent placement Dr Barbie Banner will attempt Rt PCN today Pt and sister aware of procedure benefits and risks and agreeable to proceed Consent signed and in chart  [place] A 09/15/2013, 9:17 AM

## 2013-09-15 NOTE — Progress Notes (Signed)
Subjective: Interval History: has complaints , she is scared.  Objective: Vital signs in last 24 hours: Temp:  [97.8 F (36.6 C)-98.7 F (37.1 C)] 97.8 F (36.6 C) (08/11 0313) Pulse Rate:  [57-96] 59 (08/11 0600) Resp:  [0-22] 22 (08/11 0600) BP: (129-184)/(63-91) 173/88 mmHg (08/11 0600) SpO2:  [100 %] 100 % (08/11 0600) FiO2 (%):  [22 %-30 %] 30 % (08/11 0400) Weight:  [61.9 kg (136 lb 7.4 oz)] 61.9 kg (136 lb 7.4 oz) (08/11 0400) Weight change: 1.7 kg (3 lb 12 oz)  Intake/Output from previous day: 08/10 0701 - 08/11 0700 In: 395 [P.O.:120; I.V.:225; IV Piggyback:50] Out: 482 [Urine:482] Intake/Output this shift:    General appearance: cooperative and trach, vent, contracted arms and legs Resp: rales LLL and rhonchi bibasilar Cardio: S1, S2 normal and systolic murmur: holosystolic 2/6, blowing at apex GI: pos bs,  liver down 4 cm Extremities: edema 3+, contracted  Lab Results:  Recent Labs  09/14/13 0500 09/15/13 0400  WBC 5.3 5.8  HGB 8.0* 7.8*  HCT 23.6* 22.9*  PLT 132* 135*   BMET:  Recent Labs  09/14/13 0500 09/15/13 0400  NA 141 141  K 3.2* 3.5*  CL 106 106  CO2 21 22  GLUCOSE 89 84  BUN 44* 41*  CREATININE 3.75* 3.51*  CALCIUM 7.4* 7.4*   No results found for this basename: PTH,  in the last 72 hours Iron Studies:  Recent Labs  09/13/13 0806  IRON 79  TIBC 99*  FERRITIN 625*    Studies/Results: Ct Abdomen Pelvis Wo Contrast  09/14/2013   CLINICAL DATA:  Abdominal pain. Right-sided hydronephrosis noted on recent ultrasound examination.  EXAM: CT ABDOMEN AND PELVIS WITHOUT CONTRAST  TECHNIQUE: Multidetector CT imaging of the abdomen and pelvis was performed following the standard protocol without IV contrast.  COMPARISON:  Abdominal ultrasound 09/12/2013.  FINDINGS: Lung Bases: Central venous catheter tip in the right atrium. Mild cardiomegaly. Small amount of pericardial fluid and/or thickening, unlikely to be of hemodynamic significance at  this time. Small right pleural effusion layering dependently. Moderate to large left pleural effusion layering dependently. Complete atelectasis of the visualized portions of the left lower lobe. Thickening of the peribronchovascular interstitium suggesting a background of mild interstitial pulmonary edema.  Abdomen/Pelvis: Image 49 of series 201 demonstrates a 9 mm calculus in the proximal third of the left ureter. This is associated with mild proximal left hydroureteronephrosis and perinephric stranding. There are several other nonobstructive calculi within the left renal collecting system, largest of which measures 13 mm in the interpolar region. Additionally, in the urinary bladder adjacent to the Foley balloon catheter there is a 4 mm calculus. The unenhanced appearance of the right kidney is unremarkable.  Image quality is significantly compromised by a large amount of beam hardening artifact from the patient's arms (the patient was imaged with her arms down). With these limitations in mind, the unenhanced appearance of the liver, gallbladder, pancreas, spleen and bilateral adrenal glands is unremarkable. There is a small volume of ascites. No pneumoperitoneum. No pathologic distention of small bowel. Accurate assessment for lymphadenopathy is not possible on today's noncontrast CT examination secondary to the excessive image noise. Atherosclerosis throughout the abdominal and pelvic vasculature. Status post hysterectomy. Ovaries are not confidently identified and may be surgically absent or atrophic. Foley balloon catheter with tip in the lumen of the urinary bladder. Small amount of gas in the nondependent portion of the urinary bladder is presumably iatrogenic.  Musculoskeletal: Severe diffuse body wall  edema. There are no aggressive appearing lytic or blastic lesions noted in the visualized portions of the skeleton.  IMPRESSION: 1. 9 mm calculus in the proximal third of the left ureter with mild proximal  left hydroureteronephrosis and perinephric stranding indicating mild obstruction at this time. 2. Multiple additional nonobstructive calculi within the left renal collecting system measuring up to 13 mm, and a 4 mm calculus in the the urinary bladder, as above. 3. Findings suggestive of anasarca, including mild interstitial pulmonary edema, small pericardial effusion, bilateral pleural effusions, small volume of ascites, and diffuse body wall edema. 4. Complete atelectasis of the visualized portions of the left lower lobe. 5. Mild cardiomegaly. 6. Additional incidental findings, as above.   Electronically Signed   By: Trudie Reedaniel  Entrikin M.D.   On: 09/14/2013 19:27   Dg Chest Port 1 View  09/14/2013   CLINICAL DATA:  Difficulty breathing  EXAM: PORTABLE CHEST - 1 VIEW  COMPARISON:  September 13, 2013  FINDINGS: Tracheostomy position is stable compared to 1 day prior. It is difficult to ascertain the amount of the tracheostomy that is within the trachea given angulation at the tracheostomy site placement. Central catheter tip is in the right atrium. No pneumothorax. There is persistent effusion on the left with left lower lobe consolidation. Right lung is clear. Heart is mildly enlarged. Pulmonary vascularity is within normal limits. No adenopathy.  IMPRESSION: Tube and catheter positions are unchanged without pneumothorax. Left lower lobe consolidation with effusion, stable. No new opacity. Right lung clear.   Electronically Signed   By: Bretta BangWilliam  Woodruff M.D.   On: 09/14/2013 07:31    I have reviewed the patient's current medications.  Assessment/Plan: 1  AKI  Obstruction with RTA.  Vol xs. Acid/base ok.  K borderline.  Will replete K and give Lasix.   2 Obstruction  Per Urology will need 1 PCNx now and then another. 3 MS with blindness and paralysis 4 UTI suspect bact and fungus 5 Nutrition poor.   6 Resp failure per CCM, will attempt diuresis 7 Anemia on epo, Fe ok. P Lasix, K, PCNx,  ? Goals of care  discussions.    LOS: 3 days   Matei Magnone L 09/15/2013,7:02 AM

## 2013-09-15 NOTE — Procedures (Signed)
Post-Procedure Note  Pre-operative Diagnosis: Renal failure with mild right hydronephrosis and left renal stones       Post-operative Diagnosis: Same   Indications: Right hydronephrosis  Procedure Details:   Mild right hydronephrosis by ultrasound.  Needle directed into right kidney with ultrasound and opacified the collecting system.  A dilated lower pole calyx was targeted.  Access obtained from lower pole calyx but it was extremely difficult to exit the infundibulum due to narrowing and/or tortuousity.  Eventually, a wire was advanced into ureter and 10 Fr drain placed in renal pelvis.   Findings: Mild right hydronephrosis, most prominent in lower pole.  Concern for narrowing or stenosis in lower pole infundibulum.  Contrast extravasation from collection system while trying to access the renal pelvis.  10 French catheter placed in renal pelvis.  Evidence for blood clots in renal pelvis at end of case.  Complications: None     Condition: Stable  Plan: Follow drain output and renal function.

## 2013-09-15 NOTE — Progress Notes (Signed)
Patient ID: [name] LegatoSandra [name], female   DOB: 1950/04/19, 63 y.o.   MRN: [id number]    Subjective: [name] DavenportSandra is resting comfortably and remains afebrile with stable VS.   Her catheter was exchanged overnight for lack of drainage and was found to be obstructed.  After replacement she drained 200ml and she has had improved UOP through the night.   Her Cr has declined slightly to 3.51.  ROS:  Review of Systems  Unable to perform ROS: intubated    Anti-infectives: Anti-infectives   Start     Dose/Rate Route Frequency Ordered Stop   09/13/13 0400  cefTRIAXone (ROCEPHIN) 1 g in dextrose 5 % 50 mL IVPB     1 g 100 mL/hr over 30 Minutes Intravenous Every 24 hours 09/12/13 2209     09/12/13 1715  vancomycin (VANCOCIN) 2,000 mg in sodium chloride 0.9 % 500 mL IVPB     2,000 mg 250 mL/hr over 120 Minutes Intravenous  Once 09/12/13 1702 09/12/13 2104   09/12/13 1715  piperacillin-tazobactam (ZOSYN) IVPB 3.375 g     3.375 g 100 mL/hr over 30 Minutes Intravenous  Once 09/12/13 1702 09/12/13 1900      Current Facility-Administered Medications  Medication Dose Route Frequency Provider Last Rate Last Dose  . 0.9 %  sodium chloride infusion  250 mL Intravenous PRN Wadie LessenMario Christianto, MD 10 mL/hr at 09/15/13 0500 250 mL at 09/15/13 0500  . albuterol (PROVENTIL) (2.5 MG/3ML) 0.083% nebulizer solution 2.5 mg  2.5 mg Nebulization Q2H PRN Wadie LessenMario Christianto, MD      . amLODipine (NORVASC) tablet 10 mg  10 mg Oral Daily Trevor IhaJames L Deterding, MD   10 mg at 09/14/13 0903  . antiseptic oral rinse (CPC / CETYLPYRIDINIUM CHLORIDE 0.05%) solution 7 mL  7 mL Mouth Rinse QID Nelda Bucksaniel J Feinstein, MD   7 mL at 09/15/13 0400  . cefTRIAXone (ROCEPHIN) 1 g in dextrose 5 % 50 mL IVPB  1 g Intravenous Q24H Wadie LessenMario Christianto, MD   1 g at 09/15/13 0430  . chlorhexidine (PERIDEX) 0.12 % solution 15 mL  15 mL Mouth Rinse BID Nelda Bucksaniel J Feinstein, MD   15 mL at 09/14/13 2042  . Chlorhexidine Gluconate Cloth 2 % PADS 6 each  6 each Topical  Q0600 Nelda Bucksaniel J Feinstein, MD   6 each at 09/14/13 0600  . ciprofloxacin (CILOXAN) 0.3 % ophthalmic solution 2 drop  2 drop Right Eye QID Nelda Bucksaniel J Feinstein, MD   2 drop at 09/14/13 2241  . darbepoetin (ARANESP) injection 200 mcg  200 mcg Subcutaneous Q Mon-1800 Trevor IhaJames L Deterding, MD   200 mcg at 09/14/13 1856  . docusate sodium (COLACE) capsule 100 mg  100 mg Oral BID Wadie LessenMario Christianto, MD   100 mg at 09/14/13 2230  . DULoxetine (CYMBALTA) DR capsule 30 mg  30 mg Oral Daily Coralyn HellingVineet Sood, MD   30 mg at 09/14/13 0936  . hydrALAZINE (APRESOLINE) injection 10 mg  10 mg Intravenous Q4H PRN Wadie LessenMario Christianto, MD   10 mg at 09/15/13 [id number]  . hydrALAZINE (APRESOLINE) tablet 100 mg  100 mg Oral 3 times per day Wadie LessenMario Christianto, MD   100 mg at 09/15/13 0520  . hydrocortisone cream 1 %   Topical BID Jenelle MagesAdam R. Belanger, MD      . labetalol (NORMODYNE) tablet 300 mg  300 mg Oral TID Wadie LessenMario Christianto, MD   300 mg at 09/14/13 2230  . levETIRAcetam (KEPPRA) tablet 250 mg  250 mg Oral QHS Coralyn HellingVineet Sood, MD  250 mg at 09/14/13 2238  . levETIRAcetam (KEPPRA) tablet 500 mg  500 mg Oral Daily Coralyn Helling, MD   500 mg at 09/14/13 0935  . mupirocin ointment (BACTROBAN) 2 % 1 application  1 application Nasal BID Nelda Bucks, MD   1 application at 09/14/13 2241  . ondansetron (ZOFRAN) injection 4 mg  4 mg Intravenous Q6H PRN Wadie Lessen, MD   4 mg at 09/14/13 1594  . pantoprazole (PROTONIX) EC tablet 40 mg  40 mg Oral Daily Nelda Bucks, MD   40 mg at 09/14/13 2230  . sodium bicarbonate tablet 1,300 mg  1,300 mg Oral BID Trevor Iha, MD   1,300 mg at 09/14/13 2230  . tiZANidine (ZANAFLEX) tablet 2 mg  2 mg Oral TID Wadie Lessen, MD   2 mg at 09/14/13 2230   Med list reviewed.   Objective: Vital signs in last 24 hours: Temp:  [97.8 F (36.6 C)-98.7 F (37.1 C)] 97.8 F (36.6 C) (08/11 0313) Pulse Rate:  [57-96] 59 (08/11 0600) Resp:  [0-22] 22 (08/11 0600) BP: (129-184)/(63-91) 173/88  mmHg (08/11 0600) SpO2:  [100 %] 100 % (08/11 0600) FiO2 (%):  [22 %-30 %] 30 % (08/11 0400) Weight:  [61.9 kg (136 lb 7.4 oz)] 61.9 kg (136 lb 7.4 oz) (08/11 0400)  Intake/Output from previous day: 08/10 0701 - 08/11 0700 In: 395 [P.O.:120; I.V.:225; IV Piggyback:50] Out: 482 [Urine:482] Intake/Output this shift: Total I/O In: 155 [I.V.:105; IV Piggyback:50] Out: 382 [Urine:382]   Physical Exam  Constitutional: She is well-developed, well-nourished, and in no distress.    Lab Results:   Recent Labs  09/14/13 0500 09/15/13 0400  WBC 5.3 5.8  HGB 8.0* 7.8*  HCT 23.6* 22.9*  PLT 132* 135*   BMET  Recent Labs  09/14/13 0500 09/15/13 0400  NA 141 141  K 3.2* 3.5*  CL 106 106  CO2 21 22  GLUCOSE 89 84  BUN 44* 41*  CREATININE 3.75* 3.51*  CALCIUM 7.4* 7.4*   PT/INR No results found for this basename: LABPROT, INR,  in the last 72 hours ABG  Recent Labs  09/12/13 1654 09/13/13 0341  PHART 7.213* 7.368  HCO3 10.8* 13.4*    Studies/Results: Ct Abdomen Pelvis Wo Contrast  09/14/2013   CLINICAL DATA:  Abdominal pain. Right-sided hydronephrosis noted on recent ultrasound examination.  EXAM: CT ABDOMEN AND PELVIS WITHOUT CONTRAST  TECHNIQUE: Multidetector CT imaging of the abdomen and pelvis was performed following the standard protocol without IV contrast.  COMPARISON:  Abdominal ultrasound 09/12/2013.  FINDINGS: Lung Bases: Central venous catheter tip in the right atrium. Mild cardiomegaly. Small amount of pericardial fluid and/or thickening, unlikely to be of hemodynamic significance at this time. Small right pleural effusion layering dependently. Moderate to large left pleural effusion layering dependently. Complete atelectasis of the visualized portions of the left lower lobe. Thickening of the peribronchovascular interstitium suggesting a background of mild interstitial pulmonary edema.  Abdomen/Pelvis: Image 49 of series 201 demonstrates a 9 mm calculus in the  proximal third of the left ureter. This is associated with mild proximal left hydroureteronephrosis and perinephric stranding. There are several other nonobstructive calculi within the left renal collecting system, largest of which measures 13 mm in the interpolar region. Additionally, in the urinary bladder adjacent to the Foley balloon catheter there is a 4 mm calculus. The unenhanced appearance of the right kidney is unremarkable.  Image quality is significantly compromised by a large amount of beam hardening artifact  from the patient's arms (the patient was imaged with her arms down). With these limitations in mind, the unenhanced appearance of the liver, gallbladder, pancreas, spleen and bilateral adrenal glands is unremarkable. There is a small volume of ascites. No pneumoperitoneum. No pathologic distention of small bowel. Accurate assessment for lymphadenopathy is not possible on today's noncontrast CT examination secondary to the excessive image noise. Atherosclerosis throughout the abdominal and pelvic vasculature. Status post hysterectomy. Ovaries are not confidently identified and may be surgically absent or atrophic. Foley balloon catheter with tip in the lumen of the urinary bladder. Small amount of gas in the nondependent portion of the urinary bladder is presumably iatrogenic.  Musculoskeletal: Severe diffuse body wall edema. There are no aggressive appearing lytic or blastic lesions noted in the visualized portions of the skeleton.  IMPRESSION: 1. 9 mm calculus in the proximal third of the left ureter with mild proximal left hydroureteronephrosis and perinephric stranding indicating mild obstruction at this time. 2. Multiple additional nonobstructive calculi within the left renal collecting system measuring up to 13 mm, and a 4 mm calculus in the the urinary bladder, as above. 3. Findings suggestive of anasarca, including mild interstitial pulmonary edema, small pericardial effusion, bilateral  pleural effusions, small volume of ascites, and diffuse body wall edema. 4. Complete atelectasis of the visualized portions of the left lower lobe. 5. Mild cardiomegaly. 6. Additional incidental findings, as above.   Electronically Signed   By: Trudie Reed M.D.   On: 09/14/2013 19:27   Dg Chest Port 1 View  09/14/2013   CLINICAL DATA:  Difficulty breathing  EXAM: PORTABLE CHEST - 1 VIEW  COMPARISON:  September 13, 2013  FINDINGS: Tracheostomy position is stable compared to 1 day prior. It is difficult to ascertain the amount of the tracheostomy that is within the trachea given angulation at the tracheostomy site placement. Central catheter tip is in the right atrium. No pneumothorax. There is persistent effusion on the left with left lower lobe consolidation. Right lung is clear. Heart is mildly enlarged. Pulmonary vascularity is within normal limits. No adenopathy.  IMPRESSION: Tube and catheter positions are unchanged without pneumothorax. Left lower lobe consolidation with effusion, stable. No new opacity. Right lung clear.   Electronically Signed   By: Bretta Bang M.D.   On: 09/14/2013 07:31   Labs and recent CT reviewed.    Assessment: She remains stable with improved UOP. Right hydro possibly secondary to matrix material. Left renal and ureteral stones.   Plan: She is to have a right perc placed today and will eventually need a left perc with consideration of a percutaneous nephrolithotomy as a ureteroscopic approach will be difficult because of her contractures.     LOS: 3 days    Gordon Vandunk J 09/15/2013

## 2013-09-15 NOTE — Progress Notes (Signed)
PULMONARY / CRITICAL CARE MEDICINE   Name: [name] MRN: [id number] DOB: 07/07/1950    ADMISSION DATE:  09/12/2013  REFERRING MD :  Dr. 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  She is blind, Lt hemiparesis, and chronic trach with24h full  vent dependence but eats with vent and is on chronic rigidity  STUDIES:  8/08 Renal u/s >> medical renal disease  SIGNIFICANT EVENTS: 8/08 Admit, renal consulted 09/13/13: Wide awake and in no distress. 09/14/13: never on pressors. Acidosis seem resolving: bicarb stopped. Creat better. Continues on full vent support that is baseline. Family member feeding her lunch at this point. Seems to have recurring right eye conjuctivitis +    SUBJECTIVE/OVERNIGHT/INTERVAL HX 09/15/13: ATN deemed post obstructive. Has left sided stones. Plan for IR perc drain on right today and in future left sided Perc drain next few days and in some distant future suprapubic cath (Dr Annabell Howells and Dr DEterding). Othjerwise no issues overnight  VITAL SIGNS: Temp:  [97.8 F (36.6 C)-98.7 F (37.1 C)] 98.7 F (37.1 C) (08/11 1208) Pulse Rate:  [57-89] 89 (08/11 1229) Resp:  [0-22] 22 (08/11 1229) BP: (129-180)/(62-91) 137/68 mmHg (08/11 1229) SpO2:  [100 %] 100 % (08/11 1229) FiO2 (%):  [22 %-30 %] 30 % (08/11 1229) Weight:  [61.9 kg (136 lb 7.4 oz)] 61.9 kg (136 lb 7.4 oz) (08/11 0400) VENTILATOR SETTINGS: Vent Mode:  [-] PRVC FiO2 (%):  [22 %-30 %] 30 % Set Rate:  [22 bmp] 22 bmp Vt Set:  [500 mL] 500 mL PEEP:  [5 cmH20] 5 cmH20 Plateau Pressure:  [24 cmH20-29 cmH20] 27 cmH20 INTAKE / OUTPUT:  Intake/Output Summary (Last 24 hours) at 09/15/13 1426 Last data filed at 09/15/13 1100  Gross per 24 hour  Intake    255 ml  Output    482 ml  Net   -227 ml    PHYSICAL EXAMINATION: General:   Chronic critically ill Neuro:  Able to move her legs but restricted due to contractures, no facial droop, tongue midline HEENT:  Atraumatic, trache site looks clean, has chronic skin changes around the trache, eyes open with right eye redness Cardiovascular:  RRR, 3/6 systolic murmur Lungs:  Fair air movement, no wheeze, mild rhonchi Abdomen: soft, nontender, no guarding Musculoskeletal:  Has flexion contractures on both arms, has extensor contractures on both legs, mild edema on her arms, mild edema on the face Skin:  (+) sacral decubitus ulcer St. 1  LABS: PULMONARY  Recent Labs Lab 09/12/13 1654 09/13/13 0341  PHART 7.213* 7.368  PCO2ART 26.4* 23.2*  PO2ART 133.0* 160.0*  HCO3 10.8* 13.4*  TCO2 12 14  O2SAT 99.0 99.0    CBC  Recent Labs Lab 09/13/13 0300 09/14/13 0500 09/15/13 0400  HGB 6.9* 8.0* 7.8*  HCT 21.2* 23.6* 22.9*  WBC 5.7 5.3 5.8  PLT 167 132* 135*    COAGULATION  Recent Labs Lab 09/15/13 0659  INR 1.12    CARDIAC  No results found for this basename: TROPONINI,  in the last 168 hours No results found for this basename: PROBNP,  in the last 168 hours   CHEMISTRY  Recent Labs Lab 09/12/13 1750 09/13/13 0300 09/14/13 0500 09/15/13 0400  NA 136* 138 141 141  K 4.1 3.8 3.2* 3.5*  CL 111 110 106 106  CO2 <  7* 13* 21 22  GLUCOSE 78 87 89 84  BUN 47* 46* 44* 41*  CREATININE 4.15* 3.65* 3.75* 3.51*  CALCIUM 7.7* 7.5* 7.4* 7.4*  MG  --  2.0  --  1.6  PHOS  --  5.1* 4.3 4.1   Estimated Creatinine Clearance: 13.5 ml/min (by C-G formula based on Cr of 3.51).   LIVER  Recent Labs Lab 09/12/13 1750 09/13/13 0300 09/15/13 0400 09/15/13 0659  AST 11 12 14   --   ALT 7 7 8   --   ALKPHOS 85 77 72  --   BILITOT <0.2* 0.2* 0.4  --   PROT 5.5* 5.3* 5.0*  --   ALBUMIN 1.9* 1.8* 1.7*  --   INR  --   --   --  1.12     INFECTIOUS  Recent Labs Lab 09/12/13 1758  LATICACIDVEN <0.30*     ENDOCRINE CBG (last 3)   Recent Labs   09/13/13 1557 09/13/13 1955 09/14/13 0020  GLUCAP 102* 105* 120*         IMAGING x48h Ct Abdomen Pelvis Wo Contrast  09/14/2013   CLINICAL DATA:  Abdominal pain. Right-sided hydronephrosis noted on recent ultrasound examination.  EXAM: CT ABDOMEN AND PELVIS WITHOUT CONTRAST  TECHNIQUE: Multidetector CT imaging of the abdomen and pelvis was performed following the standard protocol without IV contrast.  COMPARISON:  Abdominal ultrasound 09/12/2013.  FINDINGS: Lung Bases: Central venous catheter tip in the right atrium. Mild cardiomegaly. Small amount of pericardial fluid and/or thickening, unlikely to be of hemodynamic significance at this time. Small right pleural effusion layering dependently. Moderate to large left pleural effusion layering dependently. Complete atelectasis of the visualized portions of the left lower lobe. Thickening of the peribronchovascular interstitium suggesting a background of mild interstitial pulmonary edema.  Abdomen/Pelvis: Image 49 of series 201 demonstrates a 9 mm calculus in the proximal third of the left ureter. This is associated with mild proximal left hydroureteronephrosis and perinephric stranding. There are several other nonobstructive calculi within the left renal collecting system, largest of which measures 13 mm in the interpolar region. Additionally, in the urinary bladder adjacent to the Foley balloon catheter there is a 4 mm calculus. The unenhanced appearance of the right kidney is unremarkable.  Image quality is significantly compromised by a large amount of beam hardening artifact from the patient's arms (the patient was imaged with her arms down). With these limitations in mind, the unenhanced appearance of the liver, gallbladder, pancreas, spleen and bilateral adrenal glands is unremarkable. There is a small volume of ascites. No pneumoperitoneum. No pathologic distention of small bowel. Accurate assessment for lymphadenopathy is not possible on today's  noncontrast CT examination secondary to the excessive image noise. Atherosclerosis throughout the abdominal and pelvic vasculature. Status post hysterectomy. Ovaries are not confidently identified and may be surgically absent or atrophic. Foley balloon catheter with tip in the lumen of the urinary bladder. Small amount of gas in the nondependent portion of the urinary bladder is presumably iatrogenic.  Musculoskeletal: Severe diffuse body wall edema. There are no aggressive appearing lytic or blastic lesions noted in the visualized portions of the skeleton.  IMPRESSION: 1. 9 mm calculus in the proximal third of the left ureter with mild proximal left hydroureteronephrosis and perinephric stranding indicating mild obstruction at this time. 2. Multiple additional nonobstructive calculi within the left renal collecting system measuring up to 13 mm, and a 4 mm calculus in the the urinary bladder, as above. 3. Findings suggestive of anasarca,  including mild interstitial pulmonary edema, small pericardial effusion, bilateral pleural effusions, small volume of ascites, and diffuse body wall edema. 4. Complete atelectasis of the visualized portions of the left lower lobe. 5. Mild cardiomegaly. 6. Additional incidental findings, as above.   Electronically Signed   By: Trudie Reedaniel  Entrikin M.D.   On: 09/14/2013 19:27   Dg Chest Port 1 View  09/14/2013   CLINICAL DATA:  Difficulty breathing  EXAM: PORTABLE CHEST - 1 VIEW  COMPARISON:  September 13, 2013  FINDINGS: Tracheostomy position is stable compared to 1 day prior. It is difficult to ascertain the amount of the tracheostomy that is within the trachea given angulation at the tracheostomy site placement. Central catheter tip is in the right atrium. No pneumothorax. There is persistent effusion on the left with left lower lobe consolidation. Right lung is clear. Heart is mildly enlarged. Pulmonary vascularity is within normal limits. No adenopathy.  IMPRESSION: Tube and catheter  positions are unchanged without pneumothorax. Left lower lobe consolidation with effusion, stable. No new opacity. Right lung clear.   Electronically Signed   By: Bretta BangWilliam  Woodruff M.D.   On: 09/14/2013 07:31      ASSESSMENT / PLAN:  PULMONARY Trach >>  A:  Acute on chronic respiratory failure 2nd to PNA. S/p trach.   - no change P:   Full vent support F/u CXR  CARDIOVASCULAR PICC RT >>  A:  HTN, CAD, hx of cardiomyopathy P:  Continue BP meds  RENAL A:   AKI >> Post obstructife Metabolic acidosis.   - improving creat, For Rt Perc drain 09/15/13 P:   Urology and renal following; see their plan regarding perc drain and supra pubic Keep in hospital til bilateral perc drain placed  GASTROINTESTINAL A:  Nutrition. P:   Renal diet oral  HEMATOLOGIC A:   Anemia of chronic disease, also has positive stool guaiac >> Transfused 8/9. P:  Continue darbopoietin No heparin for now until we are sure there is GI bleed, monitor Hb Transfused for hgb <7.0  INFECTIOUS  A:  PNA, UTI.   - culture negative as of 09/15/13 P:   BCx2 >> UC >> Sputum none Abx: Got zosyn + vanco in ED x 1 Rocephin 8/8>> Cefazolin 8/11 (prior to perc drain) >>  ENDOCRINE A:   No active issue P:   Monitor blood sugar  NEUROLOGIC A:   Known neuromyelitis optica P:   Monitor clinically  MISC A right eye conjuctivitis P cipro eye drops  TODAY'S SUMMARY: admitted for acute renal failure with metabolic acidosis, possible UTI. LLL opacity .  Improving renal failure. Rx pna and uti with ceftriaxone.  Post obs renal failure - follow urology and renal plan regarding drainage prior to sending back to [facility]. Move to [facility] with pccm primary   Dr. Kalman ShanMurali Minami Arriaga, M.D., [facility] CenterF.C.C.P Pulmonary and Critical Care Medicine Staff Physician [facility] Pager: [phone], If no answer or between  15:00h - 7:00h: call [phone]  09/15/2013 2:26  PM

## 2013-09-16 ENCOUNTER — Inpatient Hospital Stay (HOSPITAL_COMMUNITY): Payer: Medicare Other

## 2013-09-16 DIAGNOSIS — N1330 Unspecified hydronephrosis: Secondary | ICD-10-CM

## 2013-09-16 LAB — RENAL FUNCTION PANEL
ALBUMIN: 1.7 g/dL — AB (ref 3.5–5.2)
Anion gap: 12 (ref 5–15)
BUN: 39 mg/dL — ABNORMAL HIGH (ref 6–23)
CALCIUM: 7.7 mg/dL — AB (ref 8.4–10.5)
CO2: 22 mEq/L (ref 19–32)
Chloride: 107 mEq/L (ref 96–112)
Creatinine, Ser: 3.39 mg/dL — ABNORMAL HIGH (ref 0.50–1.10)
GFR calc Af Amer: 16 mL/min — ABNORMAL LOW (ref >90)
GFR, EST NON AFRICAN AMERICAN: 13 mL/min — AB (ref >90)
Glucose, Bld: 98 mg/dL (ref 70–99)
Phosphorus: 3.9 mg/dL (ref 2.3–4.6)
Potassium: 3.7 mEq/L (ref 3.7–5.3)
SODIUM: 141 meq/L (ref 137–147)

## 2013-09-16 LAB — CBC
HCT: 22.8 % — ABNORMAL LOW (ref 36.0–46.0)
Hemoglobin: 7.7 g/dL — ABNORMAL LOW (ref 12.0–15.0)
MCH: 30 pg (ref 26.0–34.0)
MCHC: 33.8 g/dL (ref 30.0–36.0)
MCV: 88.7 fL (ref 78.0–100.0)
PLATELETS: 122 10*3/uL — AB (ref 150–400)
RBC: 2.57 MIL/uL — ABNORMAL LOW (ref 3.87–5.11)
RDW: 17.5 % — AB (ref 11.5–15.5)
WBC: 6.7 10*3/uL (ref 4.0–10.5)

## 2013-09-16 MED ORDER — MIDAZOLAM HCL 2 MG/2ML IJ SOLN
INTRAMUSCULAR | Status: AC
  Filled 2013-09-16: qty 4

## 2013-09-16 MED ORDER — FUROSEMIDE 80 MG PO TABS
160.0000 mg | ORAL_TABLET | Freq: Once | ORAL | Status: AC
  Administered 2013-09-16: 160 mg via ORAL
  Filled 2013-09-16: qty 2

## 2013-09-16 MED ORDER — MIDAZOLAM HCL 2 MG/2ML IJ SOLN
INTRAMUSCULAR | Status: AC | PRN
  Administered 2013-09-16 (×3): 1 mg via INTRAVENOUS

## 2013-09-16 MED ORDER — IOHEXOL 300 MG/ML  SOLN
50.0000 mL | Freq: Once | INTRAMUSCULAR | Status: AC | PRN
  Administered 2013-09-16: 15 mL

## 2013-09-16 MED ORDER — IMIPENEM-CILASTATIN 250 MG IV SOLR
250.0000 mg | Freq: Two times a day (BID) | INTRAVENOUS | Status: DC
  Administered 2013-09-16 – 2013-09-17 (×3): 250 mg via INTRAVENOUS
  Filled 2013-09-16 (×4): qty 250

## 2013-09-16 MED ORDER — CEFAZOLIN SODIUM-DEXTROSE 2-3 GM-% IV SOLR
2.0000 g | Freq: Once | INTRAVENOUS | Status: AC
  Administered 2013-09-16: 2 g via INTRAVENOUS
  Filled 2013-09-16: qty 50

## 2013-09-16 MED ORDER — LIDOCAINE HCL 1 % IJ SOLN
INTRAMUSCULAR | Status: AC
  Filled 2013-09-16: qty 20

## 2013-09-16 MED ORDER — FENTANYL CITRATE 0.05 MG/ML IJ SOLN
INTRAMUSCULAR | Status: AC | PRN
Start: 2013-09-16 — End: 2013-09-16
  Administered 2013-09-16 (×2): 50 ug via INTRAVENOUS

## 2013-09-16 MED ORDER — FENTANYL CITRATE 0.05 MG/ML IJ SOLN
INTRAMUSCULAR | Status: AC
  Filled 2013-09-16: qty 4

## 2013-09-16 NOTE — H&P (Signed)
[name] is an 63 y.o. female.   Chief Complaint: Pt has hx neuromyelitis optica Hemiparesis All extremity contractures Lives at [facility] x 4 yrs now Admitted with fever; UTI and PNA New renal stones with partial obstruction and mild hydronephrosis R Percutaneous nephrostomy placed 8/11 Now request for L PCN per Dr [place] Service to perform stone retrieval at later date Dr Kathlene Cote has reviewed films  Feels L renal collection system has very minimal to no dilation Has agreed to attempt L PCN placement  HPI: neuromyelitis optica; Blind; Hemiparesis; HTN; CAD; cardiomyopathy; Sz  Past Medical History  Diagnosis Date  . Neuromyelitis optica   . Blindness   . Hemiparesis     left sided  . Hypertension   . Cardiomyopathy   . Coronary artery disease   . Ventilator dependent   . Respiratory failure   . Depression   . Anemia   . Seizures     Past Surgical History  Procedure Laterality Date  . Tracheostomy      No family history on file. Social History:  reports that she has never smoked. She does not have any smokeless tobacco history on file. She reports that she does not drink alcohol or use illicit drugs.  Allergies:  Allergies  Allergen Reactions  . Neurontin [Gabapentin] Other (See Comments)    Per MAR, unknown  . Xanax [Alprazolam] Other (See Comments)    Per MAR, unknown    Medications Prior to Admission  Medication Sig Dispense Refill  . acetaminophen (TYLENOL) 325 MG tablet Take 650 mg by mouth every 6 (six) hours as needed for moderate pain.      Marland Kitchen amLODipine (NORVASC) 10 MG tablet Take 10 mg by mouth daily.      . chlorhexidine (PERIDEX) 0.12 % solution Use as directed 15 mLs in the mouth or throat 2 (two) times daily.      . cloNIDine (CATAPRES) 0.1 MG tablet Take 0.1 mg by mouth every 6 (six) hours as needed (for hypertension for systolic blood pressure greater than 160).      . darbepoetin (ARANESP) 40 MCG/0.4ML SOLN injection Inject 40 mcg into  the skin every 7 (seven) days. On Monday      . DULoxetine (CYMBALTA) 30 MG capsule Take 30 mg by mouth daily.      . ferrous sulfate 325 (65 FE) MG tablet Take 325 mg by mouth 2 (two) times daily.      . hydrALAZINE (APRESOLINE) 100 MG tablet Take 100 mg by mouth 3 (three) times daily.      Marland Kitchen labetalol (NORMODYNE) 300 MG tablet Take 300 mg by mouth 3 (three) times daily.      Marland Kitchen levETIRAcetam (KEPPRA) 250 MG tablet Take 250 mg by mouth at bedtime.      . levETIRAcetam (KEPPRA) 500 MG tablet Take 500 mg by mouth daily.      Marland Kitchen levofloxacin (LEVAQUIN) 500 MG/100ML SOLN Inject 500 mg into the vein every other day.      . magnesium hydroxide (MILK OF MAGNESIA) 400 MG/5ML suspension Take 30 mLs by mouth every 8 (eight) hours as needed (for nausea or vomiting).      . Multiple Vitamins-Minerals (MULTIVITAMIN & MINERAL PO) Take 1 tablet by mouth daily.      . mupirocin ointment (BACTROBAN) 2 % Place 1 application into the nose as needed (for right toe infection).      Marland Kitchen omeprazole (PRILOSEC) 20 MG capsule Take 20 mg by mouth daily.      Marland Kitchen  promethazine (PHENERGAN) 25 MG tablet Take 12.5 mg by mouth every 6 (six) hours as needed for nausea or vomiting.      . simethicone (MYLICON) 80 MG chewable tablet Chew 80 mg by mouth every 6 (six) hours as needed for flatulence.      Marland Kitchen tiZANidine (ZANAFLEX) 2 MG tablet Take 2 mg by mouth 3 (three) times daily.        Results for orders placed during the hospital encounter of 09/12/13 (from the past 48 hour(s))  CBC     Status: Abnormal   Collection Time    09/15/13  4:00 AM      Result Value Ref Range   WBC 5.8  4.0 - 10.5 K/uL   RBC 2.60 (*) 3.87 - 5.11 MIL/uL   Hemoglobin 7.8 (*) 12.0 - 15.0 g/dL   HCT 22.9 (*) 36.0 - 46.0 %   MCV 88.1  78.0 - 100.0 fL   MCH 30.0  26.0 - 34.0 pg   MCHC 34.1  30.0 - 36.0 g/dL   RDW 17.6 (*) 11.5 - 15.5 %   Platelets 135 (*) 150 - 400 K/uL  PHOSPHORUS     Status: None   Collection Time    09/15/13  4:00 AM      Result  Value Ref Range   Phosphorus 4.1  2.3 - 4.6 mg/dL  COMPREHENSIVE METABOLIC PANEL     Status: Abnormal   Collection Time    09/15/13  4:00 AM      Result Value Ref Range   Sodium 141  137 - 147 mEq/L   Potassium 3.5 (*) 3.7 - 5.3 mEq/L   Chloride 106  96 - 112 mEq/L   CO2 22  19 - 32 mEq/L   Glucose, Bld 84  70 - 99 mg/dL   BUN 41 (*) 6 - 23 mg/dL   Creatinine, Ser 3.51 (*) 0.50 - 1.10 mg/dL   Calcium 7.4 (*) 8.4 - 10.5 mg/dL   Total Protein 5.0 (*) 6.0 - 8.3 g/dL   Albumin 1.7 (*) 3.5 - 5.2 g/dL   AST 14  0 - 37 U/L   ALT 8  0 - 35 U/L   Alkaline Phosphatase 72  39 - 117 U/L   Total Bilirubin 0.4  0.3 - 1.2 mg/dL   GFR calc non Af Amer 13 (*) >90 mL/min   GFR calc Af Amer 15 (*) >90 mL/min   Comment: (NOTE)     The eGFR has been calculated using the CKD EPI equation.     This calculation has not been validated in all clinical situations.     eGFR's persistently <90 mL/min signify possible Chronic Kidney     Disease.   Anion gap 13  5 - 15  MAGNESIUM     Status: None   Collection Time    09/15/13  4:00 AM      Result Value Ref Range   Magnesium 1.6  1.5 - 2.5 mg/dL  PROTIME-INR     Status: None   Collection Time    09/15/13  6:59 AM      Result Value Ref Range   Prothrombin Time 14.4  11.6 - 15.2 seconds   INR 1.12  0.00 - 1.49  APTT     Status: None   Collection Time    09/15/13  6:59 AM      Result Value Ref Range   aPTT 32  24 - 37 seconds  CBC  Status: Abnormal   Collection Time    09/16/13  2:56 AM      Result Value Ref Range   WBC 6.7  4.0 - 10.5 K/uL   RBC 2.57 (*) 3.87 - 5.11 MIL/uL   Hemoglobin 7.7 (*) 12.0 - 15.0 g/dL   HCT 22.8 (*) 36.0 - 46.0 %   MCV 88.7  78.0 - 100.0 fL   MCH 30.0  26.0 - 34.0 pg   MCHC 33.8  30.0 - 36.0 g/dL   RDW 17.5 (*) 11.5 - 15.5 %   Platelets 122 (*) 150 - 400 K/uL  RENAL FUNCTION PANEL     Status: Abnormal   Collection Time    09/16/13  2:56 AM      Result Value Ref Range   Sodium 141  137 - 147 mEq/L   Potassium  3.7  3.7 - 5.3 mEq/L   Chloride 107  96 - 112 mEq/L   CO2 22  19 - 32 mEq/L   Glucose, Bld 98  70 - 99 mg/dL   BUN 39 (*) 6 - 23 mg/dL   Creatinine, Ser 3.39 (*) 0.50 - 1.10 mg/dL   Calcium 7.7 (*) 8.4 - 10.5 mg/dL   Phosphorus 3.9  2.3 - 4.6 mg/dL   Albumin 1.7 (*) 3.5 - 5.2 g/dL   GFR calc non Af Amer 13 (*) >90 mL/min   GFR calc Af Amer 16 (*) >90 mL/min   Comment: (NOTE)     The eGFR has been calculated using the CKD EPI equation.     This calculation has not been validated in all clinical situations.     eGFR's persistently <90 mL/min signify possible Chronic Kidney     Disease.   Anion gap 12  5 - 15   Ct Abdomen Pelvis Wo Contrast  09/14/2013   CLINICAL DATA:  Abdominal pain. Right-sided hydronephrosis noted on recent ultrasound examination.  EXAM: CT ABDOMEN AND PELVIS WITHOUT CONTRAST  TECHNIQUE: Multidetector CT imaging of the abdomen and pelvis was performed following the standard protocol without IV contrast.  COMPARISON:  Abdominal ultrasound 09/12/2013.  FINDINGS: Lung Bases: Central venous catheter tip in the right atrium. Mild cardiomegaly. Small amount of pericardial fluid and/or thickening, unlikely to be of hemodynamic significance at this time. Small right pleural effusion layering dependently. Moderate to large left pleural effusion layering dependently. Complete atelectasis of the visualized portions of the left lower lobe. Thickening of the peribronchovascular interstitium suggesting a background of mild interstitial pulmonary edema.  Abdomen/Pelvis: Image 49 of series 201 demonstrates a 9 mm calculus in the proximal third of the left ureter. This is associated with mild proximal left hydroureteronephrosis and perinephric stranding. There are several other nonobstructive calculi within the left renal collecting system, largest of which measures 13 mm in the interpolar region. Additionally, in the urinary bladder adjacent to the Foley balloon catheter there is a 4 mm  calculus. The unenhanced appearance of the right kidney is unremarkable.  Image quality is significantly compromised by a large amount of beam hardening artifact from the patient's arms (the patient was imaged with her arms down). With these limitations in mind, the unenhanced appearance of the liver, gallbladder, pancreas, spleen and bilateral adrenal glands is unremarkable. There is a small volume of ascites. No pneumoperitoneum. No pathologic distention of small bowel. Accurate assessment for lymphadenopathy is not possible on today's noncontrast CT examination secondary to the excessive image noise. Atherosclerosis throughout the abdominal and pelvic vasculature. Status post hysterectomy. Ovaries are  not confidently identified and may be surgically absent or atrophic. Foley balloon catheter with tip in the lumen of the urinary bladder. Small amount of gas in the nondependent portion of the urinary bladder is presumably iatrogenic.  Musculoskeletal: Severe diffuse body wall edema. There are no aggressive appearing lytic or blastic lesions noted in the visualized portions of the skeleton.  IMPRESSION: 1. 9 mm calculus in the proximal third of the left ureter with mild proximal left hydroureteronephrosis and perinephric stranding indicating mild obstruction at this time. 2. Multiple additional nonobstructive calculi within the left renal collecting system measuring up to 13 mm, and a 4 mm calculus in the the urinary bladder, as above. 3. Findings suggestive of anasarca, including mild interstitial pulmonary edema, small pericardial effusion, bilateral pleural effusions, small volume of ascites, and diffuse body wall edema. 4. Complete atelectasis of the visualized portions of the left lower lobe. 5. Mild cardiomegaly. 6. Additional incidental findings, as above.   Electronically Signed   By: Vinnie Langton M.D.   On: 09/14/2013 19:27   Ir Perc Nephrostomy Right  09/15/2013   CLINICAL DATA:  Acute renal failure  with mild right hydronephrosis and left renal stones.  EXAM: RIGHT PERCUTANEOUS NEPHROSTOMY TUBE WITH ULTRASOUND AND FLUOROSCOPIC GUIDANCE  Physician: Stephan Minister. Henn, MD  FLUOROSCOPY TIME:  36 min and 48 seconds  MEDICATIONS: 4 mg versed and 200 mcg fentanyl. A radiology nurse monitored the patient for moderate sedation.  ANESTHESIA/SEDATION: Moderate sedation time: 1 hr and 50 min  PROCEDURE: Informed consent was obtained for a right percutaneous nephrostomy tube placement. The patient was placed on her left side. The right flank was evaluated with ultrasound. The right kidney was identified. The right flank was prepped and draped in sterile fashion. Maximal barrier sterile technique was utilized including caps, mask, sterile gowns, sterile gloves, sterile drape, hand hygiene and skin antiseptic. 1% lidocaine was used for local anesthetic. A 22 gauge needle was directed into the mid pole towards a calyx. Contrast injection demonstrated filling of the renal collecting system. An Accustick dilator set was placed. Additional contrast injections were performed. The catheter appeared to be entering into the renal pelvis rather than a calyx, therefore, planned for new access. Unable to cannulate an upper pole calyx with fluoroscopic guidance. Eventually, a lower pole calyx was targeted. A 22 gauge needle was directed into a lower calyx and a wire was successfully advanced into the renal collecting system. An Accustick dilator set was placed. At this point, it was very difficult to get a wire to advance into the renal pelvis. There appeared to be narrowing or stenosis in the lower pole infundibulum. A wire perforated the collecting system on a few occasions and a small amount of contrast extravasation was noted. After using a variety of wires and placing a 5 [place] Kumpe catheter, a Glidewire was eventually advanced into the renal pelvis and proximal ureter. Catheter was advanced over the wire and a Bentson wire was placed.  The tract was dilated and a 10 [place] drain was placed. Regular sized multipurpose drain appeared to be too large for the renal pelvis. As result, Bettey Mare 10 French drain was placed. Catheter was reconstituted in the renal pelvis. Catheter was sutured to the skin and attached to gravity bag.  FINDINGS: Mild right hydronephrosis. Hydronephrosis was slightly more prominent in the lower pole collecting system. Access was obtained from a lower pole calyx but the right lower pole infundibulum is either stenotic or very torturous. It was very  difficult to cannulate the renal pelvis from the lower pole access. Eventually, catheter and wire were advanced to the renal pelvis and ureter. 10 French drain reconstituted in the renal pelvis. Filling defects in the renal pelvis at the end of the study are consistent with blood clot. Contrast was draining through the ureter and contrast was identified in the bladder.  COMPLICATIONS: None  IMPRESSION: Successful placement of a right percutaneous nephrostomy tube with ultrasound and fluoroscopic guidance. The procedure was technically difficult due to the mild hydronephrosis and apparent narrowing in the right lower pole infundibulum.   Electronically Signed   By: Markus Daft M.D.   On: 09/15/2013 17:43   Ir US Guide Bx Asp/drain  09/15/2013   CLINICAL DATA:  Acute renal failure with mild right hydronephrosis and left renal stones.  EXAM: RIGHT PERCUTANEOUS NEPHROSTOMY TUBE WITH ULTRASOUND AND FLUOROSCOPIC GUIDANCE  Physician: Stephan Minister. Henn, MD  FLUOROSCOPY TIME:  36 min and 48 seconds  MEDICATIONS: 4 mg versed and 200 mcg fentanyl. A radiology nurse monitored the patient for moderate sedation.  ANESTHESIA/SEDATION: Moderate sedation time: 1 hr and 50 min  PROCEDURE: Informed consent was obtained for a right percutaneous nephrostomy tube placement. The patient was placed on her left side. The right flank was evaluated with ultrasound. The right kidney was identified. The  right flank was prepped and draped in sterile fashion. Maximal barrier sterile technique was utilized including caps, mask, sterile gowns, sterile gloves, sterile drape, hand hygiene and skin antiseptic. 1% lidocaine was used for local anesthetic. A 22 gauge needle was directed into the mid pole towards a calyx. Contrast injection demonstrated filling of the renal collecting system. An Accustick dilator set was placed. Additional contrast injections were performed. The catheter appeared to be entering into the renal pelvis rather than a calyx, therefore, planned for new access. Unable to cannulate an upper pole calyx with fluoroscopic guidance. Eventually, a lower pole calyx was targeted. A 22 gauge needle was directed into a lower calyx and a wire was successfully advanced into the renal collecting system. An Accustick dilator set was placed. At this point, it was very difficult to get a wire to advance into the renal pelvis. There appeared to be narrowing or stenosis in the lower pole infundibulum. A wire perforated the collecting system on a few occasions and a small amount of contrast extravasation was noted. After using a variety of wires and placing a 5 [place] Kumpe catheter, a Glidewire was eventually advanced into the renal pelvis and proximal ureter. Catheter was advanced over the wire and a Bentson wire was placed. The tract was dilated and a 10 [place] drain was placed. Regular sized multipurpose drain appeared to be too large for the renal pelvis. As result, Bettey Mare 10 French drain was placed. Catheter was reconstituted in the renal pelvis. Catheter was sutured to the skin and attached to gravity bag.  FINDINGS: Mild right hydronephrosis. Hydronephrosis was slightly more prominent in the lower pole collecting system. Access was obtained from a lower pole calyx but the right lower pole infundibulum is either stenotic or very torturous. It was very difficult to cannulate the renal pelvis from the  lower pole access. Eventually, catheter and wire were advanced to the renal pelvis and ureter. 10 French drain reconstituted in the renal pelvis. Filling defects in the renal pelvis at the end of the study are consistent with blood clot. Contrast was draining through the ureter and contrast was identified in the bladder.  COMPLICATIONS: None  IMPRESSION: Successful placement of a right percutaneous nephrostomy tube with ultrasound and fluoroscopic guidance. The procedure was technically difficult due to the mild hydronephrosis and apparent narrowing in the right lower pole infundibulum.   Electronically Signed   By: Markus Daft M.D.   On: 09/15/2013 17:43    Review of Systems  Constitutional: Positive for weight loss. Negative for fever.  Respiratory: Negative for shortness of breath.   Gastrointestinal: Positive for nausea and abdominal pain.  Musculoskeletal: Positive for back pain.  Neurological: Positive for weakness.  Psychiatric/Behavioral: Negative for substance abuse.    Blood pressure 168/75, pulse 88, temperature 98 F (36.7 C), temperature source Oral, resp. rate 22, height 5' (1.524 m), weight 61.2 kg (134 lb 14.7 oz), SpO2 100.00%. Physical Exam  Constitutional: She appears well-developed.  Cardiovascular: Normal rate and regular rhythm.   No murmur heard. Respiratory: Effort normal and breath sounds normal. She has no wheezes.  GI: Soft. Bowel sounds are normal. There is tenderness.  Musculoskeletal:  All extr contractures Able to follow all commands Little movement in all extremities   Neurological: She is alert.  Alert but unable to communicate verbally Mouthing all correct answers Aware of procedure and reasons  Skin: Skin is warm.  Psychiatric: She has a normal mood and affect. Thought content normal.  Consented sister via phone     Assessment/Plan B renal stones Plan for possible renal stone retrieval with Dr Jeffie Pollock at later date R PCN placed 8/11---minimal  change in renal fxn today Now scheduled for L PCN attempt Pt and sister aware of procedure benefits and risks and agreeable to proceed consent signed and in chart  [name] A 09/16/2013, 9:46 AM

## 2013-09-16 NOTE — H&P (Signed)
Agree.  Will attempt left PCN today.

## 2013-09-16 NOTE — Progress Notes (Signed)
Patient transferred to 2C06 without event. No complications. Vital signs stable at this time. Patient tolerated well. RT will continue to monitor.

## 2013-09-16 NOTE — Progress Notes (Signed)
ANTIBIOTIC CONSULT NOTE - INITIAL  Pharmacy Consult for imipenem Indication: UTI (pseudomonas/providencia)  Allergies  Allergen Reactions  . Neurontin [Gabapentin] Other (See Comments)    Per MAR, unknown  . Xanax [Alprazolam] Other (See Comments)    Per MAR, unknown    Patient Measurements: Height: 5' (152.4 cm) Weight: 134 lb 14.7 oz (61.2 kg) IBW/kg (Calculated) : 45.5  Vital Signs: Temp: 98.9 F (37.2 C) (08/12 1416) Temp src: Oral (08/12 1416) BP: 161/88 mmHg (08/12 1416) Pulse Rate: 78 (08/12 1416) Intake/Output from previous day: 08/11 0701 - 08/12 0700 In: 585 [P.O.:240; I.V.:235; IV Piggyback:100] Out: 1516 [Urine:1515; Stool:1] Intake/Output from this shift: Total I/O In: 40 [I.V.:40] Out: 415 [Urine:415]  Labs:  Recent Labs  09/14/13 0500 09/15/13 0400 09/16/13 0256  WBC 5.3 5.8 6.7  HGB 8.0* 7.8* 7.7*  PLT 132* 135* 122*  CREATININE 3.75* 3.51* 3.39*   Estimated Creatinine Clearance: 13.9 ml/min (by C-G formula based on Cr of 3.39). No results found for this basename: VANCOTROUGH, VANCOPEAK, VANCORANDOM, GENTTROUGH, GENTPEAK, GENTRANDOM, TOBRATROUGH, TOBRAPEAK, TOBRARND, AMIKACINPEAK, AMIKACINTROU, AMIKACIN,  in the last 72 hours   Microbiology: Recent Results (from the past 720 hour(s))  CULTURE, BLOOD (ROUTINE X 2)     Status: None   Collection Time    09/12/13  5:50 PM      Result Value Ref Range Status   Specimen Description BLOOD RIGHT ARM PICC LINE   Final   Special Requests BOTTLES DRAWN AEROBIC AND ANAEROBIC 5CC   Final   Culture  Setup Time     Final   Value: 09/13/2013 02:59     Performed at [facility]   Culture     Final   Value:        BLOOD CULTURE RECEIVED NO GROWTH TO DATE CULTURE WILL BE HELD FOR 5 DAYS BEFORE ISSUING A FINAL NEGATIVE REPORT     Performed at [facility]   Report Status PENDING   Incomplete  CULTURE, BLOOD (ROUTINE X 2)     Status: None   Collection Time    09/12/13  6:13 PM      Result  Value Ref Range Status   Specimen Description BLOOD LEFT HAND   Final   Special Requests BOTTLES DRAWN AEROBIC AND ANAEROBIC 5CC   Final   Culture  Setup Time     Final   Value: 09/13/2013 02:59     Performed at [facility]   Culture     Final   Value:        BLOOD CULTURE RECEIVED NO GROWTH TO DATE CULTURE WILL BE HELD FOR 5 DAYS BEFORE ISSUING A FINAL NEGATIVE REPORT     Performed at [facility]   Report Status PENDING   Incomplete  URINE CULTURE     Status: None   Collection Time    09/12/13  7:01 PM      Result Value Ref Range Status   Specimen Description URINE, CATHETERIZED   Final   Special Requests NONE   Final   Culture  Setup Time     Final   Value: 09/13/2013 03:34     Performed at [facility] Count     Final   Value: >=100,000 COLONIES/ML     Performed at [facility]   Culture     Final   Value: PROVIDENCIA STUARTII     PSEUDOMONAS AERUGINOSA     Performed at [facility]  Report Status PENDING   Incomplete   Organism ID, Bacteria PROVIDENCIA STUARTII   Final   Organism ID, Bacteria PSEUDOMONAS AERUGINOSA   Final  MRSA PCR SCREENING     Status: Abnormal   Collection Time    09/12/13 11:58 PM      Result Value Ref Range Status   MRSA by PCR POSITIVE (*) NEGATIVE Final   Comment:            The GeneXpert MRSA Assay (FDA     approved for NASAL specimens     only), is one component of a     comprehensive MRSA colonization     surveillance program. It is not     intended to diagnose MRSA     infection nor to guide or     monitor treatment for     MRSA infections.     RESULT CALLED TO, READ BACK BY AND VERIFIED WITH:     [id number] 09/13/13 MITCHELL,L   Assessment: Pt is a 62 yof transferred to [facility] for pna, renal failure, acidosis, and anemia. Pt is trach-vent dependent (at [facility] for last 4 years). Pt being initiated on imipenem for UTI (pseudomonas/providencia). S/p ceftriaxone x 5 days. CCM not concerned  for pna. Afebrile, WBC wnl. AKI but renal function improving, SCr trending down 3.51>>3.39, UOP good 1cc/kg/hr.  LVQ PTA ([facility]) Vanc 8/8 x 1 dose Zosyn 8/8 x 1 dose CTX 8/9 >> 8/12 cefaz 8/11 x1 dose Imipenem 8/12 >>  8/8 UCx >> pseudomonas, providencia (S-imipenem, R-all others) 8/8 BCx >>ngtd 8/8 MRSA: pos (mupirocin)  Goal of Therapy:  Eradication of infection  Plan:  - Initiate imipenem-cilastatin 250mg  q12h for pseudomonas/providencia in UC - F/u c/s, renal funct, clinical progress, abx dot

## 2013-09-16 NOTE — Procedures (Signed)
Procedure:  Left percutaneous nephrostomy Mild hydro by [place].  10 Fr PCN placed.  Urine bloody.

## 2013-09-16 NOTE — Progress Notes (Signed)
Patient ID: [name] LegatoSandra [name], female   DOB: 17-Apr-1950, 63 y.o.   MRN: [id number]    Subjective: [name] had successful placement of a right NT yesterday.  There was some dilation of the lower pole but contrast flowed to the bladder without obvious obstruction.   Her Cr has declined slightly to 3.39.   She had better UOP but was given lasix.   She indicates that she has some soreness on the right from the procedure.   ROS:  Review of Systems  Unable to perform ROS: intubated    Anti-infectives: Anti-infectives   Start     Dose/Rate Route Frequency Ordered Stop   09/15/13 1100  [MAR Hold]  ceFAZolin (ANCEF) IVPB 2 g/50 mL premix     (On MAR Hold since 09/15/13 1514)  Comments:  Hang ON CALL to xray 8/11   2 g 100 mL/hr over 30 Minutes Intravenous  Once 09/15/13 0943 09/15/13 1531   09/13/13 0400  cefTRIAXone (ROCEPHIN) 1 g in dextrose 5 % 50 mL IVPB     1 g 100 mL/hr over 30 Minutes Intravenous Every 24 hours 09/12/13 2209     09/12/13 1715  vancomycin (VANCOCIN) 2,000 mg in sodium chloride 0.9 % 500 mL IVPB     2,000 mg 250 mL/hr over 120 Minutes Intravenous  Once 09/12/13 1702 09/12/13 2104   09/12/13 1715  piperacillin-tazobactam (ZOSYN) IVPB 3.375 g     3.375 g 100 mL/hr over 30 Minutes Intravenous  Once 09/12/13 1702 09/12/13 1900      Current Facility-Administered Medications  Medication Dose Route Frequency Provider Last Rate Last Dose  . 0.9 %  sodium chloride infusion  250 mL Intravenous PRN Wadie LessenMario Christianto, MD 10 mL/hr at 09/15/13 2000 250 mL at 09/15/13 2000  . albuterol (PROVENTIL) (2.5 MG/3ML) 0.083% nebulizer solution 2.5 mg  2.5 mg Nebulization Q2H PRN Wadie LessenMario Christianto, MD      . amLODipine (NORVASC) tablet 10 mg  10 mg Oral Daily Trevor IhaJames L Deterding, MD   10 mg at 09/15/13 0930  . antiseptic oral rinse (CPC / CETYLPYRIDINIUM CHLORIDE 0.05%) solution 7 mL  7 mL Mouth Rinse QID Nelda Bucksaniel J Feinstein, MD   7 mL at 09/16/13 0400  . cefTRIAXone (ROCEPHIN) 1 g in dextrose  5 % 50 mL IVPB  1 g Intravenous Q24H Wadie LessenMario Christianto, MD   1 g at 09/16/13 0424  . chlorhexidine (PERIDEX) 0.12 % solution 15 mL  15 mL Mouth Rinse BID Nelda Bucksaniel J Feinstein, MD   15 mL at 09/15/13 1936  . Chlorhexidine Gluconate Cloth 2 % PADS 6 each  6 each Topical Q0600 Nelda Bucksaniel J Feinstein, MD   6 each at 09/15/13 0930  . ciprofloxacin (CILOXAN) 0.3 % ophthalmic solution 2 drop  2 drop Right Eye QID Nelda Bucksaniel J Feinstein, MD   2 drop at 09/15/13 2158  . darbepoetin (ARANESP) injection 200 mcg  200 mcg Subcutaneous Q Mon-1800 Trevor IhaJames L Deterding, MD   200 mcg at 09/14/13 1856  . docusate sodium (COLACE) capsule 100 mg  100 mg Oral BID Wadie LessenMario Christianto, MD   100 mg at 09/15/13 2157  . DULoxetine (CYMBALTA) DR capsule 30 mg  30 mg Oral Daily Coralyn HellingVineet Sood, MD   30 mg at 09/15/13 0930  . hydrALAZINE (APRESOLINE) injection 10 mg  10 mg Intravenous Q4H PRN Wadie LessenMario Christianto, MD   10 mg at 09/16/13 0422  . hydrALAZINE (APRESOLINE) tablet 100 mg  100 mg Oral 3 times per day Wadie LessenMario Christianto, MD  100 mg at 09/16/13 0559  . hydrocortisone cream 1 %   Topical BID Jenelle Mages, MD      . labetalol (NORMODYNE) tablet 300 mg  300 mg Oral TID Wadie Lessen, MD   300 mg at 09/15/13 2158  . levETIRAcetam (KEPPRA) tablet 250 mg  250 mg Oral QHS Coralyn Helling, MD   250 mg at 09/15/13 2157  . levETIRAcetam (KEPPRA) tablet 500 mg  500 mg Oral Daily Coralyn Helling, MD   500 mg at 09/15/13 0930  . mupirocin ointment (BACTROBAN) 2 % 1 application  1 application Nasal BID Nelda Bucks, MD   1 application at 09/15/13 2158  . ondansetron (ZOFRAN) injection 4 mg  4 mg Intravenous Q6H PRN Wadie Lessen, MD   4 mg at 09/15/13 2100  . pantoprazole (PROTONIX) EC tablet 40 mg  40 mg Oral Daily Nelda Bucks, MD   40 mg at 09/15/13 2157  . sodium bicarbonate tablet 1,300 mg  1,300 mg Oral BID Trevor Iha, MD   1,300 mg at 09/15/13 2158  . tiZANidine (ZANAFLEX) tablet 2 mg  2 mg Oral TID Wadie Lessen, MD   2  mg at 09/15/13 2158     Objective: Vital signs in last 24 hours: Temp:  [98.1 F (36.7 C)-98.7 F (37.1 C)] 98.4 F (36.9 C) (08/12 0436) Pulse Rate:  [61-103] 86 (08/12 0600) Resp:  [0-26] 26 (08/12 0600) BP: (92-183)/(60-119) 147/74 mmHg (08/12 0600) SpO2:  [100 %] 100 % (08/12 0600) FiO2 (%):  [30 %] 30 % (08/12 0547) Weight:  [61.2 kg (134 lb 14.7 oz)] 61.2 kg (134 lb 14.7 oz) (08/12 0500)  Intake/Output from previous day: 08/11 0701 - 08/12 0700 In: 575 [P.O.:240; I.V.:225; IV Piggyback:100] Out: 1516 [Urine:1515; Stool:1] Intake/Output this shift:     Physical Exam  Lab Results:   Recent Labs  09/15/13 0400 09/16/13 0256  WBC 5.8 6.7  HGB 7.8* 7.7*  HCT 22.9* 22.8*  PLT 135* 122*   BMET  Recent Labs  09/15/13 0400 09/16/13 0256  NA 141 141  K 3.5* 3.7  CL 106 107  CO2 22 22  GLUCOSE 84 98  BUN 41* 39*  CREATININE 3.51* 3.39*  CALCIUM 7.4* 7.7*   PT/INR  Recent Labs  09/15/13 0659  LABPROT 14.4  INR 1.12   ABG No results found for this basename: PHART, PCO2, PO2, HCO3,  in the last 72 hours  Studies/Results: Ct Abdomen Pelvis Wo Contrast  09/14/2013   CLINICAL DATA:  Abdominal pain. Right-sided hydronephrosis noted on recent ultrasound examination.  EXAM: CT ABDOMEN AND PELVIS WITHOUT CONTRAST  TECHNIQUE: Multidetector CT imaging of the abdomen and pelvis was performed following the standard protocol without IV contrast.  COMPARISON:  Abdominal ultrasound 09/12/2013.  FINDINGS: Lung Bases: Central venous catheter tip in the right atrium. Mild cardiomegaly. Small amount of pericardial fluid and/or thickening, unlikely to be of hemodynamic significance at this time. Small right pleural effusion layering dependently. Moderate to large left pleural effusion layering dependently. Complete atelectasis of the visualized portions of the left lower lobe. Thickening of the peribronchovascular interstitium suggesting a background of mild interstitial  pulmonary edema.  Abdomen/Pelvis: Image 49 of series 201 demonstrates a 9 mm calculus in the proximal third of the left ureter. This is associated with mild proximal left hydroureteronephrosis and perinephric stranding. There are several other nonobstructive calculi within the left renal collecting system, largest of which measures 13 mm in the interpolar region. Additionally, in the  urinary bladder adjacent to the Foley balloon catheter there is a 4 mm calculus. The unenhanced appearance of the right kidney is unremarkable.  Image quality is significantly compromised by a large amount of beam hardening artifact from the patient's arms (the patient was imaged with her arms down). With these limitations in mind, the unenhanced appearance of the liver, gallbladder, pancreas, spleen and bilateral adrenal glands is unremarkable. There is a small volume of ascites. No pneumoperitoneum. No pathologic distention of small bowel. Accurate assessment for lymphadenopathy is not possible on today's noncontrast CT examination secondary to the excessive image noise. Atherosclerosis throughout the abdominal and pelvic vasculature. Status post hysterectomy. Ovaries are not confidently identified and may be surgically absent or atrophic. Foley balloon catheter with tip in the lumen of the urinary bladder. Small amount of gas in the nondependent portion of the urinary bladder is presumably iatrogenic.  Musculoskeletal: Severe diffuse body wall edema. There are no aggressive appearing lytic or blastic lesions noted in the visualized portions of the skeleton.  IMPRESSION: 1. 9 mm calculus in the proximal third of the left ureter with mild proximal left hydroureteronephrosis and perinephric stranding indicating mild obstruction at this time. 2. Multiple additional nonobstructive calculi within the left renal collecting system measuring up to 13 mm, and a 4 mm calculus in the the urinary bladder, as above. 3. Findings suggestive of  anasarca, including mild interstitial pulmonary edema, small pericardial effusion, bilateral pleural effusions, small volume of ascites, and diffuse body wall edema. 4. Complete atelectasis of the visualized portions of the left lower lobe. 5. Mild cardiomegaly. 6. Additional incidental findings, as above.   Electronically Signed   By: Trudie Reed M.D.   On: 09/14/2013 19:27   Ir Perc Nephrostomy Right  09/15/2013   CLINICAL DATA:  Acute renal failure with mild right hydronephrosis and left renal stones.  EXAM: RIGHT PERCUTANEOUS NEPHROSTOMY TUBE WITH ULTRASOUND AND FLUOROSCOPIC GUIDANCE  Physician: Rachelle Hora. Henn, MD  FLUOROSCOPY TIME:  36 min and 48 seconds  MEDICATIONS: 4 mg versed and 200 mcg fentanyl. A radiology nurse monitored the patient for moderate sedation.  ANESTHESIA/SEDATION: Moderate sedation time: 1 hr and 50 min  PROCEDURE: Informed consent was obtained for a right percutaneous nephrostomy tube placement. The patient was placed on her left side. The right flank was evaluated with ultrasound. The right kidney was identified. The right flank was prepped and draped in sterile fashion. Maximal barrier sterile technique was utilized including caps, mask, sterile gowns, sterile gloves, sterile drape, hand hygiene and skin antiseptic. 1% lidocaine was used for local anesthetic. A 22 gauge needle was directed into the mid pole towards a calyx. Contrast injection demonstrated filling of the renal collecting system. An Accustick dilator set was placed. Additional contrast injections were performed. The catheter appeared to be entering into the renal pelvis rather than a calyx, therefore, planned for new access. Unable to cannulate an upper pole calyx with fluoroscopic guidance. Eventually, a lower pole calyx was targeted. A 22 gauge needle was directed into a lower calyx and a wire was successfully advanced into the renal collecting system. An Accustick dilator set was placed. At this point, it was very  difficult to get a wire to advance into the renal pelvis. There appeared to be narrowing or stenosis in the lower pole infundibulum. A wire perforated the collecting system on a few occasions and a small amount of contrast extravasation was noted. After using a variety of wires and placing a 5 [place] Kumpe catheter,  a Glidewire was eventually advanced into the renal pelvis and proximal ureter. Catheter was advanced over the wire and a Bentson wire was placed. The tract was dilated and a 10 [place] drain was placed. Regular sized multipurpose drain appeared to be too large for the renal pelvis. As result, Renee Pain 10 French drain was placed. Catheter was reconstituted in the renal pelvis. Catheter was sutured to the skin and attached to gravity bag.  FINDINGS: Mild right hydronephrosis. Hydronephrosis was slightly more prominent in the lower pole collecting system. Access was obtained from a lower pole calyx but the right lower pole infundibulum is either stenotic or very torturous. It was very difficult to cannulate the renal pelvis from the lower pole access. Eventually, catheter and wire were advanced to the renal pelvis and ureter. 10 French drain reconstituted in the renal pelvis. Filling defects in the renal pelvis at the end of the study are consistent with blood clot. Contrast was draining through the ureter and contrast was identified in the bladder.  COMPLICATIONS: None  IMPRESSION: Successful placement of a right percutaneous nephrostomy tube with ultrasound and fluoroscopic guidance. The procedure was technically difficult due to the mild hydronephrosis and apparent narrowing in the right lower pole infundibulum.   Electronically Signed   By: Richarda Overlie M.D.   On: 09/15/2013 17:43   Ir US Guide Bx Asp/drain  09/15/2013   CLINICAL DATA:  Acute renal failure with mild right hydronephrosis and left renal stones.  EXAM: RIGHT PERCUTANEOUS NEPHROSTOMY TUBE WITH ULTRASOUND AND FLUOROSCOPIC GUIDANCE   Physician: Rachelle Hora. Henn, MD  FLUOROSCOPY TIME:  36 min and 48 seconds  MEDICATIONS: 4 mg versed and 200 mcg fentanyl. A radiology nurse monitored the patient for moderate sedation.  ANESTHESIA/SEDATION: Moderate sedation time: 1 hr and 50 min  PROCEDURE: Informed consent was obtained for a right percutaneous nephrostomy tube placement. The patient was placed on her left side. The right flank was evaluated with ultrasound. The right kidney was identified. The right flank was prepped and draped in sterile fashion. Maximal barrier sterile technique was utilized including caps, mask, sterile gowns, sterile gloves, sterile drape, hand hygiene and skin antiseptic. 1% lidocaine was used for local anesthetic. A 22 gauge needle was directed into the mid pole towards a calyx. Contrast injection demonstrated filling of the renal collecting system. An Accustick dilator set was placed. Additional contrast injections were performed. The catheter appeared to be entering into the renal pelvis rather than a calyx, therefore, planned for new access. Unable to cannulate an upper pole calyx with fluoroscopic guidance. Eventually, a lower pole calyx was targeted. A 22 gauge needle was directed into a lower calyx and a wire was successfully advanced into the renal collecting system. An Accustick dilator set was placed. At this point, it was very difficult to get a wire to advance into the renal pelvis. There appeared to be narrowing or stenosis in the lower pole infundibulum. A wire perforated the collecting system on a few occasions and a small amount of contrast extravasation was noted. After using a variety of wires and placing a 5 [place] Kumpe catheter, a Glidewire was eventually advanced into the renal pelvis and proximal ureter. Catheter was advanced over the wire and a Bentson wire was placed. The tract was dilated and a 10 [place] drain was placed. Regular sized multipurpose drain appeared to be too large for the renal pelvis. As  result, Renee Pain 10 French drain was placed. Catheter was reconstituted in the renal pelvis.  Catheter was sutured to the skin and attached to gravity bag.  FINDINGS: Mild right hydronephrosis. Hydronephrosis was slightly more prominent in the lower pole collecting system. Access was obtained from a lower pole calyx but the right lower pole infundibulum is either stenotic or very torturous. It was very difficult to cannulate the renal pelvis from the lower pole access. Eventually, catheter and wire were advanced to the renal pelvis and ureter. 10 French drain reconstituted in the renal pelvis. Filling defects in the renal pelvis at the end of the study are consistent with blood clot. Contrast was draining through the ureter and contrast was identified in the bladder.  COMPLICATIONS: None  IMPRESSION: Successful placement of a right percutaneous nephrostomy tube with ultrasound and fluoroscopic guidance. The procedure was technically difficult due to the mild hydronephrosis and apparent narrowing in the right lower pole infundibulum.   Electronically Signed   By: Richarda Overlie M.D.   On: 09/15/2013 17:43   I have reviewed her recent labs and her nephrostogram films and report.  I have discussed her case with Dr. Darrick Penna.  Assessment: Sepsis with ARI. Right hydro without apparent obstruction on nephrostogram. Left renal and ureteral stones with minimal obstruction.    Plan: With the lack of significant obstruction on the right and only a minimal decline in her Cr I am going to go ahead and order the left nephrostomy tube placement.   She will need that for eventual removal of the renal and ureteral stones.   I have discussed the plan with the patient.      LOS: 4 days    Kord Monette J 09/16/2013

## 2013-09-16 NOTE — Progress Notes (Signed)
PULMONARY / CRITICAL CARE MEDICINE   Name: Griselle Rufer MRN: [id number] DOB: 02/09/1950    ADMISSION DATE:  09/12/2013  REFERRING MD :  Dr. 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  She is blind, Lt hemiparesis, and chronic trach with24h full  vent dependence but eats with vent and is on chronic rigidity  STUDIES:  8/08 Renal u/s >> medical renal disease  SIGNIFICANT EVENTS: 8/08 Admit, renal consulted 09/13/13: Wide awake and in no distress. 09/14/13: never on pressors. Acidosis seem resolving: bicarb stopped. Creat better. Continues on full vent support that is baseline. Family member feeding her lunch at this point. Seems to have recurring right eye conjuctivitis + 09/15/13: ATN deemed post obstructive bilaterally. Has left sided stones. S/p R perc drain. Plan  distant future suprapubic cath (Dr Annabell Howells and Dr DEterding).    SUBJECTIVE/OVERNIGHT/INTERVAL HX 09/16/13:  Growing MDR pseudomonas and providencia frrom urine 09/12/13. Had Rt perc drain for hydro without obstruction on nephrostogram 09/15/13.  For left sided perc drain today. Will need eventual removal of stones.  Improved urine output but also got lasix, creat down to 3.3. Moving to sDU aas well  VITAL SIGNS: Temp:  [98 F (36.7 C)-98.8 F (37.1 C)] 98.8 F (37.1 C) (08/12 1231) Pulse Rate:  [61-103] 94 (08/12 1139) Resp:  [6-26] 24 (08/12 1139) BP: (92-183)/(60-119) 141/72 mmHg (08/12 1139) SpO2:  [100 %] 100 % (08/12 1139) FiO2 (%):  [30 %] 30 % (08/12 1139) Weight:  [61.2 kg (134 lb 14.7 oz)] 61.2 kg (134 lb 14.7 oz) (08/12 0500) VENTILATOR SETTINGS: Vent Mode:  [-] PRVC FiO2 (%):  [30 %] 30 % Set Rate:  [22 bmp] 22 bmp Vt Set:  [500 mL] 500 mL PEEP:  [5 cmH20] 5 cmH20 Plateau Pressure:  [25 cmH20-35 cmH20] 31 cmH20 INTAKE /  OUTPUT:  Intake/Output Summary (Last 24 hours) at 09/16/13 1354 Last data filed at 09/16/13 1100  Gross per 24 hour  Intake    565 ml  Output   1721 ml  Net  -1156 ml    PHYSICAL EXAMINATION: General:  Chronic critically ill Neuro:  Able to move her legs but restricted due to contractures, no facial droop, tongue midline HEENT:  Atraumatic, trache site looks clean, has chronic skin changes around the trache, eyes open with right eye redness Cardiovascular:  RRR, 3/6 systolic murmur Lungs:  Fair air movement, no wheeze, mild rhonchi Abdomen: soft, nontender, no guarding, right perc drain + Musculoskeletal:  Has flexion contractures on both arms, has extensor contractures on both legs, mild edema on her arms, mild edema on the face Skin:  (+) sacral decubitus ulcer St. 1  LABS: PULMONARY  Recent Labs Lab 09/12/13 1654 09/13/13 0341  PHART 7.213* 7.368  PCO2ART 26.4* 23.2*  PO2ART 133.0* 160.0*  HCO3 10.8* 13.4*  TCO2 12 14  O2SAT 99.0 99.0    CBC  Recent Labs Lab 09/14/13 0500 09/15/13 0400 09/16/13 0256  HGB 8.0* 7.8* 7.7*  HCT 23.6* 22.9* 22.8*  WBC 5.3 5.8 6.7  PLT 132* 135* 122*    COAGULATION  Recent Labs Lab 09/15/13 0659  INR 1.12    CARDIAC  No results found for this basename: TROPONINI,  in the last 168 hours No results found for this basename: PROBNP,  in the last 168 hours  CHEMISTRY  Recent Labs Lab 09/12/13 1750 09/13/13 0300 09/14/13 0500 09/15/13 0400 09/16/13 0256  NA 136* 138 141 141 141  K 4.1 3.8 3.2* 3.5* 3.7  CL 111 110 106 106 107  CO2 <7* 13* 21 22 22   GLUCOSE 78 87 89 84 98  BUN 47* 46* 44* 41* 39*  CREATININE 4.15* 3.65* 3.75* 3.51* 3.39*  CALCIUM 7.7* 7.5* 7.4* 7.4* 7.7*  MG  --  2.0  --  1.6  --   PHOS  --  5.1* 4.3 4.1 3.9   Estimated Creatinine Clearance: 13.9 ml/min (by C-G formula based on Cr of 3.39).   LIVER  Recent Labs Lab 09/12/13 1750 09/13/13 0300 09/15/13 0400 09/15/13 0659 09/16/13 0256   AST 11 12 14   --   --   ALT 7 7 8   --   --   ALKPHOS 85 77 72  --   --   BILITOT <0.2* 0.2* 0.4  --   --   PROT 5.5* 5.3* 5.0*  --   --   ALBUMIN 1.9* 1.8* 1.7*  --  1.7*  INR  --   --   --  1.12  --      INFECTIOUS  Recent Labs Lab 09/12/13 1758  LATICACIDVEN <0.30*     ENDOCRINE CBG (last 3)   Recent Labs  09/13/13 1557 09/13/13 1955 09/14/13 0020  GLUCAP 102* 105* 120*         IMAGING x48h Ct Abdomen Pelvis Wo Contrast  09/14/2013   CLINICAL DATA:  Abdominal pain. Right-sided hydronephrosis noted on recent ultrasound examination.  EXAM: CT ABDOMEN AND PELVIS WITHOUT CONTRAST  TECHNIQUE: Multidetector CT imaging of the abdomen and pelvis was performed following the standard protocol without IV contrast.  COMPARISON:  Abdominal ultrasound 09/12/2013.  FINDINGS: Lung Bases: Central venous catheter tip in the right atrium. Mild cardiomegaly. Small amount of pericardial fluid and/or thickening, unlikely to be of hemodynamic significance at this time. Small right pleural effusion layering dependently. Moderate to large left pleural effusion layering dependently. Complete atelectasis of the visualized portions of the left lower lobe. Thickening of the peribronchovascular interstitium suggesting a background of mild interstitial pulmonary edema.  Abdomen/Pelvis: Image 49 of series 201 demonstrates a 9 mm calculus in the proximal third of the left ureter. This is associated with mild proximal left hydroureteronephrosis and perinephric stranding. There are several other nonobstructive calculi within the left renal collecting system, largest of which measures 13 mm in the interpolar region. Additionally, in the urinary bladder adjacent to the Foley balloon catheter there is a 4 mm calculus. The unenhanced appearance of the right kidney is unremarkable.  Image quality is significantly compromised by a large amount of beam hardening artifact from the patient's arms (the patient was  imaged with her arms down). With these limitations in mind, the unenhanced appearance of the liver, gallbladder, pancreas, spleen and bilateral adrenal glands is unremarkable. There is a small volume of ascites. No pneumoperitoneum. No pathologic distention of small bowel. Accurate assessment for lymphadenopathy is not possible on today's noncontrast CT examination secondary to the excessive image noise. Atherosclerosis throughout the abdominal and pelvic vasculature. Status post hysterectomy. Ovaries are not confidently identified and may be surgically absent or atrophic. Foley balloon catheter with tip in the lumen of the urinary bladder. Small amount of gas in the nondependent portion of the urinary bladder is presumably iatrogenic.  Musculoskeletal: Severe diffuse body wall edema. There are no aggressive appearing lytic or blastic lesions noted  in the visualized portions of the skeleton.  IMPRESSION: 1. 9 mm calculus in the proximal third of the left ureter with mild proximal left hydroureteronephrosis and perinephric stranding indicating mild obstruction at this time. 2. Multiple additional nonobstructive calculi within the left renal collecting system measuring up to 13 mm, and a 4 mm calculus in the the urinary bladder, as above. 3. Findings suggestive of anasarca, including mild interstitial pulmonary edema, small pericardial effusion, bilateral pleural effusions, small volume of ascites, and diffuse body wall edema. 4. Complete atelectasis of the visualized portions of the left lower lobe. 5. Mild cardiomegaly. 6. Additional incidental findings, as above.   Electronically Signed   By: Trudie Reedaniel  Entrikin M.D.   On: 09/14/2013 19:27   Ir Perc Nephrostomy Right  09/15/2013   CLINICAL DATA:  Acute renal failure with mild right hydronephrosis and left renal stones.  EXAM: RIGHT PERCUTANEOUS NEPHROSTOMY TUBE WITH ULTRASOUND AND FLUOROSCOPIC GUIDANCE  Physician: Rachelle HoraAdam R. Henn, MD  FLUOROSCOPY TIME:  36 min and 48  seconds  MEDICATIONS: 4 mg versed and 200 mcg fentanyl. A radiology nurse monitored the patient for moderate sedation.  ANESTHESIA/SEDATION: Moderate sedation time: 1 hr and 50 min  PROCEDURE: Informed consent was obtained for a right percutaneous nephrostomy tube placement. The patient was placed on her left side. The right flank was evaluated with ultrasound. The right kidney was identified. The right flank was prepped and draped in sterile fashion. Maximal barrier sterile technique was utilized including caps, mask, sterile gowns, sterile gloves, sterile drape, hand hygiene and skin antiseptic. 1% lidocaine was used for local anesthetic. A 22 gauge needle was directed into the mid pole towards a calyx. Contrast injection demonstrated filling of the renal collecting system. An Accustick dilator set was placed. Additional contrast injections were performed. The catheter appeared to be entering into the renal pelvis rather than a calyx, therefore, planned for new access. Unable to cannulate an upper pole calyx with fluoroscopic guidance. Eventually, a lower pole calyx was targeted. A 22 gauge needle was directed into a lower calyx and a wire was successfully advanced into the renal collecting system. An Accustick dilator set was placed. At this point, it was very difficult to get a wire to advance into the renal pelvis. There appeared to be narrowing or stenosis in the lower pole infundibulum. A wire perforated the collecting system on a few occasions and a small amount of contrast extravasation was noted. After using a variety of wires and placing a 5 [place] Kumpe catheter, a Glidewire was eventually advanced into the renal pelvis and proximal ureter. Catheter was advanced over the wire and a Bentson wire was placed. The tract was dilated and a 10 [place] drain was placed. Regular sized multipurpose drain appeared to be too large for the renal pelvis. As result, Renee Painawson Mueller 10 French drain was placed. Catheter  was reconstituted in the renal pelvis. Catheter was sutured to the skin and attached to gravity bag.  FINDINGS: Mild right hydronephrosis. Hydronephrosis was slightly more prominent in the lower pole collecting system. Access was obtained from a lower pole calyx but the right lower pole infundibulum is either stenotic or very torturous. It was very difficult to cannulate the renal pelvis from the lower pole access. Eventually, catheter and wire were advanced to the renal pelvis and ureter. 10 French drain reconstituted in the renal pelvis. Filling defects in the renal pelvis at the end of the study are consistent with blood clot. Contrast was draining through the ureter  and contrast was identified in the bladder.  COMPLICATIONS: None  IMPRESSION: Successful placement of a right percutaneous nephrostomy tube with ultrasound and fluoroscopic guidance. The procedure was technically difficult due to the mild hydronephrosis and apparent narrowing in the right lower pole infundibulum.   Electronically Signed   By: Richarda Overlie M.D.   On: 09/15/2013 17:43   Ir US Guide Bx Asp/drain  09/15/2013   CLINICAL DATA:  Acute renal failure with mild right hydronephrosis and left renal stones.  EXAM: RIGHT PERCUTANEOUS NEPHROSTOMY TUBE WITH ULTRASOUND AND FLUOROSCOPIC GUIDANCE  Physician: Rachelle Hora. Henn, MD  FLUOROSCOPY TIME:  36 min and 48 seconds  MEDICATIONS: 4 mg versed and 200 mcg fentanyl. A radiology nurse monitored the patient for moderate sedation.  ANESTHESIA/SEDATION: Moderate sedation time: 1 hr and 50 min  PROCEDURE: Informed consent was obtained for a right percutaneous nephrostomy tube placement. The patient was placed on her left side. The right flank was evaluated with ultrasound. The right kidney was identified. The right flank was prepped and draped in sterile fashion. Maximal barrier sterile technique was utilized including caps, mask, sterile gowns, sterile gloves, sterile drape, hand hygiene and skin antiseptic.  1% lidocaine was used for local anesthetic. A 22 gauge needle was directed into the mid pole towards a calyx. Contrast injection demonstrated filling of the renal collecting system. An Accustick dilator set was placed. Additional contrast injections were performed. The catheter appeared to be entering into the renal pelvis rather than a calyx, therefore, planned for new access. Unable to cannulate an upper pole calyx with fluoroscopic guidance. Eventually, a lower pole calyx was targeted. A 22 gauge needle was directed into a lower calyx and a wire was successfully advanced into the renal collecting system. An Accustick dilator set was placed. At this point, it was very difficult to get a wire to advance into the renal pelvis. There appeared to be narrowing or stenosis in the lower pole infundibulum. A wire perforated the collecting system on a few occasions and a small amount of contrast extravasation was noted. After using a variety of wires and placing a 5 [place] Kumpe catheter, a Glidewire was eventually advanced into the renal pelvis and proximal ureter. Catheter was advanced over the wire and a Bentson wire was placed. The tract was dilated and a 10 [place] drain was placed. Regular sized multipurpose drain appeared to be too large for the renal pelvis. As result, Renee Pain 10 French drain was placed. Catheter was reconstituted in the renal pelvis. Catheter was sutured to the skin and attached to gravity bag.  FINDINGS: Mild right hydronephrosis. Hydronephrosis was slightly more prominent in the lower pole collecting system. Access was obtained from a lower pole calyx but the right lower pole infundibulum is either stenotic or very torturous. It was very difficult to cannulate the renal pelvis from the lower pole access. Eventually, catheter and wire were advanced to the renal pelvis and ureter. 10 French drain reconstituted in the renal pelvis. Filling defects in the renal pelvis at the end of the study  are consistent with blood clot. Contrast was draining through the ureter and contrast was identified in the bladder.  COMPLICATIONS: None  IMPRESSION: Successful placement of a right percutaneous nephrostomy tube with ultrasound and fluoroscopic guidance. The procedure was technically difficult due to the mild hydronephrosis and apparent narrowing in the right lower pole infundibulum.   Electronically Signed   By: Richarda Overlie M.D.   On: 09/15/2013 17:43  ASSESSMENT / PLAN:  PULMONARY Trach >>  A:  Acute on chronic respiratory failure 2nd to PNA. S/p trach.   - no change.  Continues full vent supprt P:   Full vent support F/u CXR  CARDIOVASCULAR PICC RT >>  A:  HTN, CAD, hx of cardiomyopathy P:  Continue BP meds  RENAL A:   AKI >> Post obstructive - s/p rt perc drain 09/15/13    - improving creat, For Left Perc drain 09/06/13 P:   Urology and renal following; see their plan regarding perc drain and supra pubic and ultimate stone removal Keep in hospital til bilateral perc drain placed  GASTROINTESTINAL A:  Nutrition. P:   Renal diet oral  HEMATOLOGIC A:   Anemia of chronic disease, also has positive stool guaiac >> Transfused 8/9. P:  Continue darbopoietin No heparin for now until we are sure there is GI bleed, monitor Hb Transfused for hgb <7.0  INFECTIOUS  A:  PNA persumed/possible  BCx2 >> negative UC 09/12/13 >> MDR psuedomonas and providencia (S- imipenem for oth) Sputum none  UTI - confirmed 09/16/13 from urine sample 09/12/13. MDR providencia and pseudomonas  P Abx: Got zosyn + vanco in ED x 1 Rocephin 8/8>>8/12 * Cefazolin 8/11 (prior to perc drain) >>8/11 (stopped following UTI result) Imipenem 09/16/13 >>  ENDOCRINE A:   No active issue P:   Monitor blood sugar  NEUROLOGIC A:   Known neuromyelitis optica P:   Monitor clinically  MISC A right eye conjuctivitis P cipro eye drops  TODAY'S SUMMARY: admitted for acute renal failure with  metabolic acidosis, possible UTI. LLL opacity .  Improving renal failure. Rx pna and uti with ceftriaxone.  Post obs renal failure - follow urology and renal plan regarding drainage prior to sending back to [facility]. Move to sDu with pccm primary   Dr. Kalman Shan, M.D., [facility].C.P Pulmonary and Critical Care Medicine Staff Physician [facility] Pager: [phone], If no answer or between  15:00h - 7:00h: call [phone]  09/16/2013 1:54 PM

## 2013-09-16 NOTE — Progress Notes (Signed)
Transported pt to IR and back with no complications.

## 2013-09-16 NOTE — Progress Notes (Signed)
Subjective: Interval History: has complaints , scared.  Objective: Vital signs in last 24 hours: Temp:  [98.1 F (36.7 C)-98.7 F (37.1 C)] 98.4 F (36.9 C) (08/12 0436) Pulse Rate:  [61-103] 86 (08/12 0600) Resp:  [0-26] 26 (08/12 0600) BP: (92-183)/(60-119) 147/74 mmHg (08/12 0600) SpO2:  [100 %] 100 % (08/12 0600) FiO2 (%):  [30 %] 30 % (08/12 0547) Weight:  [61.2 kg (134 lb 14.7 oz)] 61.2 kg (134 lb 14.7 oz) (08/12 0500) Weight change: -0.7 kg (-1 lb 8.7 oz)  Intake/Output from previous day: 08/11 0701 - 08/12 0700 In: 575 [P.O.:240; I.V.:225; IV Piggyback:100] Out: 1516 [Urine:1515; Stool:1] Intake/Output this shift:    General appearance: cooperative and edematous, trach, vent Resp: rales bibasilar and rhonchi bilaterally Cardio: S1, S2 normal and systolic murmur: holosystolic 2/6, blowing at apex GI: pos bs, liver down 5 cm Extremities: edema 3-4+, contracted UE and LE.  Lab Results:  Recent Labs  09/15/13 0400 09/16/13 0256  WBC 5.8 6.7  HGB 7.8* 7.7*  HCT 22.9* 22.8*  PLT 135* 122*   BMET:  Recent Labs  09/15/13 0400 09/16/13 0256  NA 141 141  K 3.5* 3.7  CL 106 107  CO2 22 22  GLUCOSE 84 98  BUN 41* 39*  CREATININE 3.51* 3.39*  CALCIUM 7.4* 7.7*   No results found for this basename: PTH,  in the last 72 hours Iron Studies:  Recent Labs  09/13/13 0806  IRON 79  TIBC 99*  FERRITIN 625*    Studies/Results: Ct Abdomen Pelvis Wo Contrast  09/14/2013   CLINICAL DATA:  Abdominal pain. Right-sided hydronephrosis noted on recent ultrasound examination.  EXAM: CT ABDOMEN AND PELVIS WITHOUT CONTRAST  TECHNIQUE: Multidetector CT imaging of the abdomen and pelvis was performed following the standard protocol without IV contrast.  COMPARISON:  Abdominal ultrasound 09/12/2013.  FINDINGS: Lung Bases: Central venous catheter tip in the right atrium. Mild cardiomegaly. Small amount of pericardial fluid and/or thickening, unlikely to be of hemodynamic  significance at this time. Small right pleural effusion layering dependently. Moderate to large left pleural effusion layering dependently. Complete atelectasis of the visualized portions of the left lower lobe. Thickening of the peribronchovascular interstitium suggesting a background of mild interstitial pulmonary edema.  Abdomen/Pelvis: Image 49 of series 201 demonstrates a 9 mm calculus in the proximal third of the left ureter. This is associated with mild proximal left hydroureteronephrosis and perinephric stranding. There are several other nonobstructive calculi within the left renal collecting system, largest of which measures 13 mm in the interpolar region. Additionally, in the urinary bladder adjacent to the Foley balloon catheter there is a 4 mm calculus. The unenhanced appearance of the right kidney is unremarkable.  Image quality is significantly compromised by a large amount of beam hardening artifact from the patient's arms (the patient was imaged with her arms down). With these limitations in mind, the unenhanced appearance of the liver, gallbladder, pancreas, spleen and bilateral adrenal glands is unremarkable. There is a small volume of ascites. No pneumoperitoneum. No pathologic distention of small bowel. Accurate assessment for lymphadenopathy is not possible on today's noncontrast CT examination secondary to the excessive image noise. Atherosclerosis throughout the abdominal and pelvic vasculature. Status post hysterectomy. Ovaries are not confidently identified and may be surgically absent or atrophic. Foley balloon catheter with tip in the lumen of the urinary bladder. Small amount of gas in the nondependent portion of the urinary bladder is presumably iatrogenic.  Musculoskeletal: Severe diffuse body wall edema. There are  no aggressive appearing lytic or blastic lesions noted in the visualized portions of the skeleton.  IMPRESSION: 1. 9 mm calculus in the proximal third of the left ureter with  mild proximal left hydroureteronephrosis and perinephric stranding indicating mild obstruction at this time. 2. Multiple additional nonobstructive calculi within the left renal collecting system measuring up to 13 mm, and a 4 mm calculus in the the urinary bladder, as above. 3. Findings suggestive of anasarca, including mild interstitial pulmonary edema, small pericardial effusion, bilateral pleural effusions, small volume of ascites, and diffuse body wall edema. 4. Complete atelectasis of the visualized portions of the left lower lobe. 5. Mild cardiomegaly. 6. Additional incidental findings, as above.   Electronically Signed   By: Trudie Reed M.D.   On: 09/14/2013 19:27   Ir Perc Nephrostomy Right  09/15/2013   CLINICAL DATA:  Acute renal failure with mild right hydronephrosis and left renal stones.  EXAM: RIGHT PERCUTANEOUS NEPHROSTOMY TUBE WITH ULTRASOUND AND FLUOROSCOPIC GUIDANCE  Physician: Rachelle Hora. Henn, MD  FLUOROSCOPY TIME:  36 min and 48 seconds  MEDICATIONS: 4 mg versed and 200 mcg fentanyl. A radiology nurse monitored the patient for moderate sedation.  ANESTHESIA/SEDATION: Moderate sedation time: 1 hr and 50 min  PROCEDURE: Informed consent was obtained for a right percutaneous nephrostomy tube placement. The patient was placed on her left side. The right flank was evaluated with ultrasound. The right kidney was identified. The right flank was prepped and draped in sterile fashion. Maximal barrier sterile technique was utilized including caps, mask, sterile gowns, sterile gloves, sterile drape, hand hygiene and skin antiseptic. 1% lidocaine was used for local anesthetic. A 22 gauge needle was directed into the mid pole towards a calyx. Contrast injection demonstrated filling of the renal collecting system. An Accustick dilator set was placed. Additional contrast injections were performed. The catheter appeared to be entering into the renal pelvis rather than a calyx, therefore, planned for new  access. Unable to cannulate an upper pole calyx with fluoroscopic guidance. Eventually, a lower pole calyx was targeted. A 22 gauge needle was directed into a lower calyx and a wire was successfully advanced into the renal collecting system. An Accustick dilator set was placed. At this point, it was very difficult to get a wire to advance into the renal pelvis. There appeared to be narrowing or stenosis in the lower pole infundibulum. A wire perforated the collecting system on a few occasions and a small amount of contrast extravasation was noted. After using a variety of wires and placing a 5 [place] Kumpe catheter, a Glidewire was eventually advanced into the renal pelvis and proximal ureter. Catheter was advanced over the wire and a Bentson wire was placed. The tract was dilated and a 10 [place] drain was placed. Regular sized multipurpose drain appeared to be too large for the renal pelvis. As result, Renee Pain 10 French drain was placed. Catheter was reconstituted in the renal pelvis. Catheter was sutured to the skin and attached to gravity bag.  FINDINGS: Mild right hydronephrosis. Hydronephrosis was slightly more prominent in the lower pole collecting system. Access was obtained from a lower pole calyx but the right lower pole infundibulum is either stenotic or very torturous. It was very difficult to cannulate the renal pelvis from the lower pole access. Eventually, catheter and wire were advanced to the renal pelvis and ureter. 10 French drain reconstituted in the renal pelvis. Filling defects in the renal pelvis at the end of the study are consistent with  blood clot. Contrast was draining through the ureter and contrast was identified in the bladder.  COMPLICATIONS: None  IMPRESSION: Successful placement of a right percutaneous nephrostomy tube with ultrasound and fluoroscopic guidance. The procedure was technically difficult due to the mild hydronephrosis and apparent narrowing in the right lower pole  infundibulum.   Electronically Signed   By: Richarda OverlieAdam  Henn M.D.   On: 09/15/2013 17:43   Ir [place] Guide Bx Asp/drain  09/15/2013   CLINICAL DATA:  Acute renal failure with mild right hydronephrosis and left renal stones.  EXAM: RIGHT PERCUTANEOUS NEPHROSTOMY TUBE WITH ULTRASOUND AND FLUOROSCOPIC GUIDANCE  Physician: Rachelle HoraAdam R. Henn, MD  FLUOROSCOPY TIME:  36 min and 48 seconds  MEDICATIONS: 4 mg versed and 200 mcg fentanyl. A radiology nurse monitored the patient for moderate sedation.  ANESTHESIA/SEDATION: Moderate sedation time: 1 hr and 50 min  PROCEDURE: Informed consent was obtained for a right percutaneous nephrostomy tube placement. The patient was placed on her left side. The right flank was evaluated with ultrasound. The right kidney was identified. The right flank was prepped and draped in sterile fashion. Maximal barrier sterile technique was utilized including caps, mask, sterile gowns, sterile gloves, sterile drape, hand hygiene and skin antiseptic. 1% lidocaine was used for local anesthetic. A 22 gauge needle was directed into the mid pole towards a calyx. Contrast injection demonstrated filling of the renal collecting system. An Accustick dilator set was placed. Additional contrast injections were performed. The catheter appeared to be entering into the renal pelvis rather than a calyx, therefore, planned for new access. Unable to cannulate an upper pole calyx with fluoroscopic guidance. Eventually, a lower pole calyx was targeted. A 22 gauge needle was directed into a lower calyx and a wire was successfully advanced into the renal collecting system. An Accustick dilator set was placed. At this point, it was very difficult to get a wire to advance into the renal pelvis. There appeared to be narrowing or stenosis in the lower pole infundibulum. A wire perforated the collecting system on a few occasions and a small amount of contrast extravasation was noted. After using a variety of wires and placing a 5  [place] Kumpe catheter, a Glidewire was eventually advanced into the renal pelvis and proximal ureter. Catheter was advanced over the wire and a Bentson wire was placed. The tract was dilated and a 10 [place] drain was placed. Regular sized multipurpose drain appeared to be too large for the renal pelvis. As result, Renee Painawson Mueller 10 French drain was placed. Catheter was reconstituted in the renal pelvis. Catheter was sutured to the skin and attached to gravity bag.  FINDINGS: Mild right hydronephrosis. Hydronephrosis was slightly more prominent in the lower pole collecting system. Access was obtained from a lower pole calyx but the right lower pole infundibulum is either stenotic or very torturous. It was very difficult to cannulate the renal pelvis from the lower pole access. Eventually, catheter and wire were advanced to the renal pelvis and ureter. 10 French drain reconstituted in the renal pelvis. Filling defects in the renal pelvis at the end of the study are consistent with blood clot. Contrast was draining through the ureter and contrast was identified in the bladder.  COMPLICATIONS: None  IMPRESSION: Successful placement of a right percutaneous nephrostomy tube with ultrasound and fluoroscopic guidance. The procedure was technically difficult due to the mild hydronephrosis and apparent narrowing in the right lower pole infundibulum.   Electronically Signed   By: Meriel PicaAdam  Henn M.D.  On: 09/15/2013 17:43    I have reviewed the patient's current medications.  Assessment/Plan: 1 AKI making more urine with Lasix and PCNx.  Discussed with Dr. Annabell Howells, getting 2nd Nx.  Not sure if can get stents in future. Cr slowly better. K, acid/base ok 2 Vol overload 3 MS with blindness, paralysis, vent dependence 4 HTn better with meds, lower vol 5 Anemia epo 6 HCAP on AB 7 VDRF P HD, epo,AB, 2nd PCNx, Vent.        LOS: 4 days   Jacorie Ernsberger L 09/16/2013,7:16 AM

## 2013-09-17 DIAGNOSIS — R0902 Hypoxemia: Secondary | ICD-10-CM

## 2013-09-17 LAB — CBC
HCT: 23.3 % — ABNORMAL LOW (ref 36.0–46.0)
Hemoglobin: 7.4 g/dL — ABNORMAL LOW (ref 12.0–15.0)
MCH: 29.7 pg (ref 26.0–34.0)
MCHC: 31.8 g/dL (ref 30.0–36.0)
MCV: 93.6 fL (ref 78.0–100.0)
Platelets: 129 10*3/uL — ABNORMAL LOW (ref 150–400)
RBC: 2.49 MIL/uL — AB (ref 3.87–5.11)
RDW: 17.8 % — ABNORMAL HIGH (ref 11.5–15.5)
WBC: 6.7 10*3/uL (ref 4.0–10.5)

## 2013-09-17 LAB — RENAL FUNCTION PANEL
Albumin: 1.6 g/dL — ABNORMAL LOW (ref 3.5–5.2)
Anion gap: 14 (ref 5–15)
BUN: 36 mg/dL — ABNORMAL HIGH (ref 6–23)
CALCIUM: 7.5 mg/dL — AB (ref 8.4–10.5)
CHLORIDE: 109 meq/L (ref 96–112)
CO2: 21 meq/L (ref 19–32)
CREATININE: 3.29 mg/dL — AB (ref 0.50–1.10)
GFR, EST AFRICAN AMERICAN: 16 mL/min — AB (ref >90)
GFR, EST NON AFRICAN AMERICAN: 14 mL/min — AB (ref >90)
Glucose, Bld: 80 mg/dL (ref 70–99)
Phosphorus: 4.1 mg/dL (ref 2.3–4.6)
Potassium: 3.4 mEq/L — ABNORMAL LOW (ref 3.7–5.3)
SODIUM: 144 meq/L (ref 137–147)

## 2013-09-17 LAB — URINE CULTURE: Colony Count: >=100000

## 2013-09-17 MED ORDER — FUROSEMIDE 80 MG PO TABS: 80.0000 mg | ORAL_TABLET | Freq: Two times a day (BID) | ORAL | Status: DC

## 2013-09-17 MED ORDER — SODIUM CHLORIDE 0.9 % IV SOLN: 250.0000 mg | Freq: Two times a day (BID) | INTRAVENOUS | Status: DC

## 2013-09-17 MED ORDER — CIPROFLOXACIN HCL 0.3 % OP SOLN: 2.0000 [drp] | Freq: Four times a day (QID) | OPHTHALMIC | Status: DC

## 2013-09-17 MED ORDER — SODIUM CHLORIDE 0.9 % IV SOLN: 250.0000 mL | INTRAVENOUS | Status: DC | PRN

## 2013-09-17 MED ORDER — POTASSIUM CHLORIDE 20 MEQ/15ML (10%) PO LIQD
40.0000 meq | Freq: Two times a day (BID) | ORAL | Status: DC
  Administered 2013-09-17: 40 meq via ORAL
  Filled 2013-09-17: qty 30

## 2013-09-17 MED ORDER — POTASSIUM CHLORIDE CRYS ER 20 MEQ PO TBCR: 40.0000 meq | EXTENDED_RELEASE_TABLET | Freq: Two times a day (BID) | ORAL | Status: DC

## 2013-09-17 MED ORDER — SODIUM BICARBONATE 650 MG PO TABS: 1300.0000 mg | ORAL_TABLET | Freq: Two times a day (BID) | ORAL | Status: DC

## 2013-09-17 MED ORDER — DSS 100 MG PO CAPS: 100.0000 mg | ORAL_CAPSULE | Freq: Two times a day (BID) | ORAL | Status: AC

## 2013-09-17 NOTE — Progress Notes (Signed)
Subjective: B PCN in place R PCN 8/11- draining well L PCN 8/12- draining well Both bloody Pt seems comfortable  Objective: Vital signs in last 24 hours: Temp:  [97.9 F (36.6 C)-99.8 F (37.7 C)] 99 F (37.2 C) (08/13 0406) Pulse Rate:  [67-96] 78 (08/13 0804) Resp:  [8-25] 22 (08/13 0320) BP: (139-198)/(70-94) 149/80 mmHg (08/13 0804) SpO2:  [100 %] 100 % (08/13 0804) FiO2 (%):  [30 %-100 %] 30 % (08/13 0804) Weight:  [61 kg (134 lb 7.7 oz)] 61 kg (134 lb 7.7 oz) (08/13 0500) Last BM Date: 09/14/13  Intake/Output from previous day: 08/12 0701 - 08/13 0700 In: 140 [I.V.:40; IV Piggyback:100] Out: 955 [Urine:955] Intake/Output this shift: Total I/O In: -  Out: 225 [Urine:225]  PE:  Afeb (99); VSS B PCNs intact NT; no bleeding Sites clean and dry R output: 670 cc yesterday; 300 cc in bag- bloody L output: 150 cc yesterday; 100 cc in bag- bloody Bun/Cr: 36/3.29 (39/3.39) Wbc 6.7    Lab Results:   Recent Labs  09/16/13 0256 09/17/13 0500  WBC 6.7 6.7  HGB 7.7* 7.4*  HCT 22.8* 23.3*  PLT 122* 129*   BMET  Recent Labs  09/16/13 0256 09/17/13 0500  NA 141 144  K 3.7 3.4*  CL 107 109  CO2 22 21  GLUCOSE 98 80  BUN 39* 36*  CREATININE 3.39* 3.29*  CALCIUM 7.7* 7.5*   PT/INR  Recent Labs  09/15/13 0659  LABPROT 14.4  INR 1.12   ABG No results found for this basename: PHART, PCO2, PO2, HCO3,  in the last 72 hours  Studies/Results: Ir Perc Nephrostomy Left  09/16/2013   CLINICAL DATA:  Renal failure and status post right percutaneous nephrostomy tube placement yesterday. The left kidney contains calculi including a ureteral calculus and request has been made to now place a nephrostomy tube on the left side.  EXAM: 1. ULTRASOUND GUIDANCE FOR PUNCTURE OF THE LEFT RENAL COLLECTING SYSTEM. 2. LEFT PERCUTANEOUS NEPHROSTOMY TUBE PLACEMENT.  COMPARISON:  CT on 09/14/2013.  ANESTHESIA/SEDATION: 3.0 mg IV Versed; 100 mcg IV Fentanyl.  Total Moderate  Sedation Time  25 minutes  CONTRAST:  Twenty ml Omnipaque 300  MEDICATIONS: No additional medications.  FLUOROSCOPY TIME:  3 minutes and 30 seconds.  PROCEDURE: The procedure, risks, benefits, and alternatives were explained to the patient. Questions regarding the procedure were encouraged and answered. The patient understands and consents to the procedure.  The left flank region was prepped with Betadine in a sterile fashion, and a sterile drape was applied covering the operative field. A sterile gown and sterile gloves were used for the procedure. Local anesthesia was provided with 1% Lidocaine.  Ultrasound was used to localize the left kidney. Under direct ultrasound guidance, a 21 gauge needle was advanced into the renal collecting system. Ultrasound image documentation was performed. Aspiration of urine sample was performed followed by contrast injection.  A transitional dilator was advanced over a guidewire. Percutaneous tract dilatation was then performed over the guidewire. A 10 -French percutaneous nephrostomy tube was then advanced and formed in the collecting system. Catheter position was confirmed by fluoroscopy after contrast injection.  The catheter was secured at the skin with a Prolene retention suture and Stat-Lock device. A gravity bag was placed.  COMPLICATIONS: None.  FINDINGS: Ultrasound shows mild left hydronephrosis. Initial upper pole access was performed with contrast injection under fluoroscopy. The nephrostomy tube was ultimately placed via lower pole access. There was some hemorrhage in the collecting  system after tube placement. The tube was advanced into the renal pelvis with the tip of the catheter extending just into the proximal ureter.  IMPRESSION: Successful placement of left percutaneous nephrostomy. This will be left to gravity drainage.   Electronically Signed   By: Irish Lack M.D.   On: 09/16/2013 17:16   Ir Perc Nephrostomy Right  09/15/2013   CLINICAL DATA:  Acute  renal failure with mild right hydronephrosis and left renal stones.  EXAM: RIGHT PERCUTANEOUS NEPHROSTOMY TUBE WITH ULTRASOUND AND FLUOROSCOPIC GUIDANCE  Physician: Rachelle Hora. Henn, MD  FLUOROSCOPY TIME:  36 min and 48 seconds  MEDICATIONS: 4 mg versed and 200 mcg fentanyl. A radiology nurse monitored the patient for moderate sedation.  ANESTHESIA/SEDATION: Moderate sedation time: 1 hr and 50 min  PROCEDURE: Informed consent was obtained for a right percutaneous nephrostomy tube placement. The patient was placed on her left side. The right flank was evaluated with ultrasound. The right kidney was identified. The right flank was prepped and draped in sterile fashion. Maximal barrier sterile technique was utilized including caps, mask, sterile gowns, sterile gloves, sterile drape, hand hygiene and skin antiseptic. 1% lidocaine was used for local anesthetic. A 22 gauge needle was directed into the mid pole towards a calyx. Contrast injection demonstrated filling of the renal collecting system. An Accustick dilator set was placed. Additional contrast injections were performed. The catheter appeared to be entering into the renal pelvis rather than a calyx, therefore, planned for new access. Unable to cannulate an upper pole calyx with fluoroscopic guidance. Eventually, a lower pole calyx was targeted. A 22 gauge needle was directed into a lower calyx and a wire was successfully advanced into the renal collecting system. An Accustick dilator set was placed. At this point, it was very difficult to get a wire to advance into the renal pelvis. There appeared to be narrowing or stenosis in the lower pole infundibulum. A wire perforated the collecting system on a few occasions and a small amount of contrast extravasation was noted. After using a variety of wires and placing a 5 [place] Kumpe catheter, a Glidewire was eventually advanced into the renal pelvis and proximal ureter. Catheter was advanced over the wire and a Bentson  wire was placed. The tract was dilated and a 10 [place] drain was placed. Regular sized multipurpose drain appeared to be too large for the renal pelvis. As result, Renee Pain 10 French drain was placed. Catheter was reconstituted in the renal pelvis. Catheter was sutured to the skin and attached to gravity bag.  FINDINGS: Mild right hydronephrosis. Hydronephrosis was slightly more prominent in the lower pole collecting system. Access was obtained from a lower pole calyx but the right lower pole infundibulum is either stenotic or very torturous. It was very difficult to cannulate the renal pelvis from the lower pole access. Eventually, catheter and wire were advanced to the renal pelvis and ureter. 10 French drain reconstituted in the renal pelvis. Filling defects in the renal pelvis at the end of the study are consistent with blood clot. Contrast was draining through the ureter and contrast was identified in the bladder.  COMPLICATIONS: None  IMPRESSION: Successful placement of a right percutaneous nephrostomy tube with ultrasound and fluoroscopic guidance. The procedure was technically difficult due to the mild hydronephrosis and apparent narrowing in the right lower pole infundibulum.   Electronically Signed   By: Richarda Overlie M.D.   On: 09/15/2013 17:43   Ir US Guide Bx Asp/drain  09/16/2013  CLINICAL DATA:  Renal failure and status post right percutaneous nephrostomy tube placement yesterday. The left kidney contains calculi including a ureteral calculus and request has been made to now place a nephrostomy tube on the left side.  EXAM: 1. ULTRASOUND GUIDANCE FOR PUNCTURE OF THE LEFT RENAL COLLECTING SYSTEM. 2. LEFT PERCUTANEOUS NEPHROSTOMY TUBE PLACEMENT.  COMPARISON:  CT on 09/14/2013.  ANESTHESIA/SEDATION: 3.0 mg IV Versed; 100 mcg IV Fentanyl.  Total Moderate Sedation Time  25 minutes  CONTRAST:  Twenty ml Omnipaque 300  MEDICATIONS: No additional medications.  FLUOROSCOPY TIME:  3 minutes and 30  seconds.  PROCEDURE: The procedure, risks, benefits, and alternatives were explained to the patient. Questions regarding the procedure were encouraged and answered. The patient understands and consents to the procedure.  The left flank region was prepped with Betadine in a sterile fashion, and a sterile drape was applied covering the operative field. A sterile gown and sterile gloves were used for the procedure. Local anesthesia was provided with 1% Lidocaine.  Ultrasound was used to localize the left kidney. Under direct ultrasound guidance, a 21 gauge needle was advanced into the renal collecting system. Ultrasound image documentation was performed. Aspiration of urine sample was performed followed by contrast injection.  A transitional dilator was advanced over a guidewire. Percutaneous tract dilatation was then performed over the guidewire. A 10 -French percutaneous nephrostomy tube was then advanced and formed in the collecting system. Catheter position was confirmed by fluoroscopy after contrast injection.  The catheter was secured at the skin with a Prolene retention suture and Stat-Lock device. A gravity bag was placed.  COMPLICATIONS: None.  FINDINGS: Ultrasound shows mild left hydronephrosis. Initial upper pole access was performed with contrast injection under fluoroscopy. The nephrostomy tube was ultimately placed via lower pole access. There was some hemorrhage in the collecting system after tube placement. The tube was advanced into the renal pelvis with the tip of the catheter extending just into the proximal ureter.  IMPRESSION: Successful placement of left percutaneous nephrostomy. This will be left to gravity drainage.   Electronically Signed   By: Irish Lack M.D.   On: 09/16/2013 17:16   Ir US Guide Bx Asp/drain  09/15/2013   CLINICAL DATA:  Acute renal failure with mild right hydronephrosis and left renal stones.  EXAM: RIGHT PERCUTANEOUS NEPHROSTOMY TUBE WITH ULTRASOUND AND FLUOROSCOPIC  GUIDANCE  Physician: Rachelle Hora. Henn, MD  FLUOROSCOPY TIME:  36 min and 48 seconds  MEDICATIONS: 4 mg versed and 200 mcg fentanyl. A radiology nurse monitored the patient for moderate sedation.  ANESTHESIA/SEDATION: Moderate sedation time: 1 hr and 50 min  PROCEDURE: Informed consent was obtained for a right percutaneous nephrostomy tube placement. The patient was placed on her left side. The right flank was evaluated with ultrasound. The right kidney was identified. The right flank was prepped and draped in sterile fashion. Maximal barrier sterile technique was utilized including caps, mask, sterile gowns, sterile gloves, sterile drape, hand hygiene and skin antiseptic. 1% lidocaine was used for local anesthetic. A 22 gauge needle was directed into the mid pole towards a calyx. Contrast injection demonstrated filling of the renal collecting system. An Accustick dilator set was placed. Additional contrast injections were performed. The catheter appeared to be entering into the renal pelvis rather than a calyx, therefore, planned for new access. Unable to cannulate an upper pole calyx with fluoroscopic guidance. Eventually, a lower pole calyx was targeted. A 22 gauge needle was directed into a lower calyx and a wire  was successfully advanced into the renal collecting system. An Accustick dilator set was placed. At this point, it was very difficult to get a wire to advance into the renal pelvis. There appeared to be narrowing or stenosis in the lower pole infundibulum. A wire perforated the collecting system on a few occasions and a small amount of contrast extravasation was noted. After using a variety of wires and placing a 5 [place] Kumpe catheter, a Glidewire was eventually advanced into the renal pelvis and proximal ureter. Catheter was advanced over the wire and a Bentson wire was placed. The tract was dilated and a 10 [place] drain was placed. Regular sized multipurpose drain appeared to be too large for the renal  pelvis. As result, Renee Painawson Mueller 10 French drain was placed. Catheter was reconstituted in the renal pelvis. Catheter was sutured to the skin and attached to gravity bag.  FINDINGS: Mild right hydronephrosis. Hydronephrosis was slightly more prominent in the lower pole collecting system. Access was obtained from a lower pole calyx but the right lower pole infundibulum is either stenotic or very torturous. It was very difficult to cannulate the renal pelvis from the lower pole access. Eventually, catheter and wire were advanced to the renal pelvis and ureter. 10 French drain reconstituted in the renal pelvis. Filling defects in the renal pelvis at the end of the study are consistent with blood clot. Contrast was draining through the ureter and contrast was identified in the bladder.  COMPLICATIONS: None  IMPRESSION: Successful placement of a right percutaneous nephrostomy tube with ultrasound and fluoroscopic guidance. The procedure was technically difficult due to the mild hydronephrosis and apparent narrowing in the right lower pole infundibulum.   Electronically Signed   By: Richarda OverlieAdam  Henn M.D.   On: 09/15/2013 17:43    Anti-infectives: Anti-infectives   Start     Dose/Rate Route Frequency Ordered Stop   09/16/13 1415  imipenem-cilastatin (PRIMAXIN) 250 mg in sodium chloride 0.9 % 100 mL IVPB     250 mg 200 mL/hr over 30 Minutes Intravenous Every 12 hours 09/16/13 1408     09/16/13 1100  ceFAZolin (ANCEF) IVPB 2 g/50 mL premix    Comments:  Hang ON CALL to xray 8/12   2 g 100 mL/hr over 30 Minutes Intravenous  Once 09/16/13 1007 09/16/13 1231   09/15/13 1100  [MAR Hold]  ceFAZolin (ANCEF) IVPB 2 g/50 mL premix     (On MAR Hold since 09/15/13 1514)  Comments:  Hang ON CALL to xray 8/11   2 g 100 mL/hr over 30 Minutes Intravenous  Once 09/15/13 0943 09/15/13 1531   09/13/13 0400  cefTRIAXone (ROCEPHIN) 1 g in dextrose 5 % 50 mL IVPB  Status:  Discontinued     1 g 100 mL/hr over 30 Minutes Intravenous  Every 24 hours 09/12/13 2209 09/16/13 1408   09/12/13 1715  vancomycin (VANCOCIN) 2,000 mg in sodium chloride 0.9 % 500 mL IVPB     2,000 mg 250 mL/hr over 120 Minutes Intravenous  Once 09/12/13 1702 09/12/13 2104   09/12/13 1715  piperacillin-tazobactam (ZOSYN) IVPB 3.375 g     3.375 g 100 mL/hr over 30 Minutes Intravenous  Once 09/12/13 1702 09/12/13 1900      Assessment/Plan: s/p * No surgery found *   B PCNs in place Notes mention poss nephrostogram and removal of Rt PCN  I discussed this with Dr Bonnielee HaffHoss-- Rec: leave in place til output is clear. Will follow   LOS: 5 days  Precilla Purnell A 09/17/2013

## 2013-09-17 NOTE — Progress Notes (Signed)
PULMONARY / CRITICAL CARE MEDICINE   Name: [name] MRN: [id number] DOB: 1950-04-17    ADMISSION DATE:  09/12/2013  REFERRING MD :  Dr. 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  STUDIES:  8/08 Renal u/s >> medical renal disease  SIGNIFICANT EVENTS: 8/08 Admit, renal consulted 09/13/13: Wide awake and in no distress. 09/14/13: never on pressors. Acidosis seem resolving: bicarb stopped. Creat better. Continues on full vent support that is baseline. Family member feeding her lunch at this point. Seems to have recurring right eye conjuctivitis + 09/15/13: ATN deemed post obstructive bilaterally. Has left sided stones. S/p R perc drain. Plan  distant future suprapubic cath (Dr Annabell HowellsWrenn and Dr DEterding).  8/12 L perc nephrostomy drain in place   SUBJECTIVE/OVERNIGHT/INTERVAL HX   VITAL SIGNS: Temp:  [97.9 F (36.6 C)-99.8 F (37.7 C)] 99.4 F (37.4 C) (08/13 0824) Pulse Rate:  [67-96] 86 (08/13 0824) Resp:  [8-24] 22 (08/13 0824) BP: (141-198)/(70-94) 149/80 mmHg (08/13 0824) SpO2:  [100 %] 100 % (08/13 0824) FiO2 (%):  [30 %-100 %] 30 % (08/13 0824) Weight:  [61 kg (134 lb 7.7 oz)] 61 kg (134 lb 7.7 oz) (08/13 0500) VENTILATOR SETTINGS: Vent Mode:  [-] PRVC FiO2 (%):  [30 %-100 %] 30 % Set Rate:  [22 bmp] 22 bmp Vt Set:  [500 mL] 500 mL PEEP:  [5 cmH20] 5 cmH20 Plateau Pressure:  [20 cmH20-31 cmH20] 22 cmH20 INTAKE / OUTPUT:  Intake/Output Summary (Last 24 hours) at 09/17/13 1022 Last data filed at 09/17/13 1003  Gross per 24 hour  Intake    350 ml  Output    975 ml  Net   -625 ml    PHYSICAL EXAMINATION: General:  Chronic critically ill Neuro:  Awake on vent, speaking to  me MSK: multiple contractures in ext HEENT: trach OK PULM: CTA B CV: RRR no mgr Ab: BS+, soft Ext: trace edema  LABS: PULMONARY  Recent Labs Lab 09/12/13 1654 09/13/13 0341  PHART 7.213* 7.368  PCO2ART 26.4* 23.2*  PO2ART 133.0* 160.0*  HCO3 10.8* 13.4*  TCO2 12 14  O2SAT 99.0 99.0    CBC  Recent Labs Lab 09/15/13 0400 09/16/13 0256 09/17/13 0500  HGB 7.8* 7.7* 7.4*  HCT 22.9* 22.8* 23.3*  WBC 5.8 6.7 6.7  PLT 135* 122* 129*    COAGULATION  Recent Labs Lab 09/15/13 0659  INR 1.12    CARDIAC  No results found for this basename: TROPONINI,  in the last 168 hours No results found for this basename: PROBNP,  in the last 168 hours   CHEMISTRY  Recent Labs Lab 09/12/13 1750 09/13/13 0300 09/14/13 0500 09/15/13 0400 09/16/13 0256 09/17/13 0500  NA 136* 138 141 141 141 144  K 4.1 3.8 3.2* 3.5* 3.7 3.4*  CL 111 110 106 106 107 109  CO2 <7* 13* 21 22 22 21   GLUCOSE 78 87 89 84 98 80  BUN 47* 46* 44* 41* 39* 36*  CREATININE 4.15* 3.65* 3.75* 3.51* 3.39* 3.29*  CALCIUM 7.7* 7.5* 7.4* 7.4* 7.7* 7.5*  MG  --  2.0  --  1.6  --   --   PHOS  --  5.1* 4.3 4.1 3.9 4.1   Estimated Creatinine Clearance: 14.3 ml/min (by C-G formula based on Cr of 3.29).   LIVER  Recent Labs Lab 09/12/13 1750 09/13/13 0300 09/15/13 0400 09/15/13 0659 09/16/13 0256 09/17/13 0500  AST 11 12 14   --   --   --   ALT 7 7 8   --   --   --   ALKPHOS 85 77 72  --   --   --   BILITOT <0.2* 0.2* 0.4  --   --   --   PROT 5.5* 5.3* 5.0*  --   --   --   ALBUMIN 1.9* 1.8* 1.7*  --  1.7* 1.6*  INR  --   --   --  1.12  --   --      INFECTIOUS  Recent Labs Lab 09/12/13 1758  LATICACIDVEN <0.30*     ENDOCRINE CBG (last 3)  No results found for this basename: GLUCAP,  in the last 72 hours       IMAGING x48h Ir Perc Nephrostomy Left  09/16/2013   CLINICAL DATA:  Renal failure and status post right percutaneous nephrostomy tube placement yesterday. The left kidney  contains calculi including a ureteral calculus and request has been made to now place a nephrostomy tube on the left side.  EXAM: 1. ULTRASOUND GUIDANCE FOR PUNCTURE OF THE LEFT RENAL COLLECTING SYSTEM. 2. LEFT PERCUTANEOUS NEPHROSTOMY TUBE PLACEMENT.  COMPARISON:  CT on 09/14/2013.  ANESTHESIA/SEDATION: 3.0 mg IV Versed; 100 mcg IV Fentanyl.  Total Moderate Sedation Time  25 minutes  CONTRAST:  Twenty ml Omnipaque 300  MEDICATIONS: No additional medications.  FLUOROSCOPY TIME:  3 minutes and 30 seconds.  PROCEDURE: The procedure, risks, benefits, and alternatives were explained to the patient. Questions regarding the procedure were encouraged and answered. The patient understands and consents to the procedure.  The left flank region was prepped with Betadine in a sterile fashion, and a sterile drape was applied covering the operative field. A sterile gown and sterile gloves were used for the procedure. Local anesthesia was provided with 1% Lidocaine.  Ultrasound was used to localize the left kidney. Under direct ultrasound guidance, a 21 gauge needle was advanced into the renal collecting system. Ultrasound image documentation was performed. Aspiration of urine sample was performed followed by contrast injection.  A transitional dilator was advanced over a guidewire. Percutaneous tract dilatation was then performed over the guidewire. A 10 -French percutaneous nephrostomy tube was then advanced and formed in the collecting system. Catheter position was confirmed by fluoroscopy after contrast injection.  The catheter was secured at the skin with a Prolene retention suture and Stat-Lock device. A gravity bag was placed.  COMPLICATIONS: None.  FINDINGS: Ultrasound shows mild left hydronephrosis. Initial upper pole access was performed with contrast injection under fluoroscopy. The nephrostomy tube was ultimately placed via lower pole access. There was some hemorrhage in the collecting system after tube placement. The  tube was advanced into the renal pelvis with the tip of the catheter extending just into the proximal ureter.  IMPRESSION: Successful placement of left percutaneous nephrostomy. This will be left to gravity drainage.   Electronically Signed   By: Irish Lack M.D.   On: 09/16/2013 17:16   Ir Perc Nephrostomy Right  09/15/2013   CLINICAL DATA:  Acute renal failure with mild right hydronephrosis and left renal stones.  EXAM: RIGHT PERCUTANEOUS NEPHROSTOMY TUBE WITH ULTRASOUND AND FLUOROSCOPIC GUIDANCE  Physician: Rachelle Hora. Lowella Dandy, MD  FLUOROSCOPY TIME:  36 min and 48 seconds  MEDICATIONS: 4 mg versed and 200 mcg fentanyl. A radiology nurse monitored the patient for moderate sedation.  ANESTHESIA/SEDATION: Moderate sedation time: 1 hr and 50 min  PROCEDURE: Informed consent was obtained for a right percutaneous nephrostomy tube placement. The patient was placed on her left side. The right flank was evaluated with ultrasound. The right kidney was identified. The right flank was prepped and draped in sterile fashion. Maximal barrier sterile technique was utilized including caps, mask, sterile gowns, sterile gloves, sterile drape, hand hygiene and skin antiseptic. 1% lidocaine was used for local anesthetic. A 22 gauge needle was directed into the mid pole towards a calyx. Contrast injection demonstrated filling of the renal collecting system. An Accustick dilator set was placed. Additional contrast injections were performed. The catheter appeared to be entering into the renal pelvis rather than a calyx, therefore, planned for new access. Unable to cannulate an upper pole calyx with fluoroscopic guidance. Eventually, a lower pole calyx was targeted. A 22 gauge needle was directed into a lower calyx and a wire was successfully advanced into the renal collecting system. An Accustick dilator set was placed. At this point, it was very difficult to get a wire to advance into the renal pelvis. There appeared to be narrowing or  stenosis in the lower pole infundibulum. A wire perforated the collecting system on a few occasions and a small amount of contrast extravasation was noted. After using a variety of wires and placing a 5 [place] Kumpe catheter, a Glidewire was eventually advanced into the renal pelvis and proximal ureter. Catheter was advanced over the wire and a Bentson wire was placed. The tract was dilated and a 10 [place] drain was placed. Regular sized multipurpose drain appeared to be too large for the renal pelvis. As result, Renee Pain 10 French drain was placed. Catheter was reconstituted in the renal pelvis. Catheter was sutured to the skin and attached to gravity bag.  FINDINGS: Mild right hydronephrosis. Hydronephrosis was slightly more prominent in the lower pole collecting system. Access was obtained from a lower pole calyx but the right lower pole infundibulum is either stenotic or very torturous. It was very difficult to cannulate the renal pelvis from the lower pole access. Eventually, catheter and wire were advanced to the renal pelvis and ureter. 10 French drain reconstituted in the renal pelvis. Filling defects in the renal pelvis at the end of the study are consistent with blood clot. Contrast was draining through the ureter and contrast was identified in the bladder.  COMPLICATIONS: None  IMPRESSION: Successful placement of a right percutaneous nephrostomy tube with ultrasound and fluoroscopic guidance. The procedure was technically difficult due to the mild hydronephrosis and apparent narrowing in the right lower pole infundibulum.   Electronically Signed   By: Richarda Overlie M.D.   On: 09/15/2013 17:43   Ir US Guide Bx Asp/drain  09/16/2013   CLINICAL DATA:  Renal failure and status post right percutaneous nephrostomy tube placement yesterday. The left kidney contains calculi including a ureteral calculus and request has been made to now place a nephrostomy tube on the left side.  EXAM: 1. ULTRASOUND GUIDANCE  FOR PUNCTURE OF THE LEFT RENAL COLLECTING SYSTEM. 2. LEFT PERCUTANEOUS NEPHROSTOMY TUBE PLACEMENT.  COMPARISON:  CT on 09/14/2013.  ANESTHESIA/SEDATION: 3.0 mg IV Versed; 100 mcg IV Fentanyl.  Total Moderate Sedation Time  25 minutes  CONTRAST:  Twenty ml Omnipaque 300  MEDICATIONS: No additional medications.  FLUOROSCOPY TIME:  3 minutes and 30 seconds.  PROCEDURE: The procedure, risks, benefits, and alternatives were explained to the patient. Questions regarding the procedure were encouraged and answered. The patient understands and consents to the procedure.  The left flank region was prepped with Betadine in a sterile fashion, and a sterile drape was applied covering the operative field. A sterile gown and sterile gloves were used for the procedure. Local anesthesia was provided with 1% Lidocaine.  Ultrasound was used to localize the left kidney. Under direct ultrasound guidance, a 21 gauge needle was advanced into the renal collecting system. Ultrasound image documentation was performed. Aspiration of urine sample was performed followed by contrast injection.  A transitional dilator was advanced over a guidewire. Percutaneous tract dilatation was then performed over the guidewire. A 10 -French percutaneous nephrostomy tube was then advanced and formed in the collecting system. Catheter position was confirmed by fluoroscopy after contrast injection.  The catheter was secured at the skin with a Prolene retention suture and Stat-Lock device. A gravity bag was placed.  COMPLICATIONS: None.  FINDINGS: Ultrasound shows mild left hydronephrosis. Initial upper pole access was performed with contrast injection under fluoroscopy. The nephrostomy tube was ultimately placed via lower pole access. There was some hemorrhage in the collecting system after tube placement. The tube was advanced into the renal pelvis with the tip of the catheter extending just into the proximal ureter.  IMPRESSION: Successful placement of left  percutaneous nephrostomy. This will be left to gravity drainage.   Electronically Signed   By: Irish Lack M.D.   On: 09/16/2013 17:16   Ir US Guide Bx Asp/drain  09/15/2013   CLINICAL DATA:  Acute renal failure with mild right hydronephrosis and left renal stones.  EXAM: RIGHT PERCUTANEOUS NEPHROSTOMY TUBE WITH ULTRASOUND AND FLUOROSCOPIC GUIDANCE  Physician: Rachelle Hora. Henn, MD  FLUOROSCOPY TIME:  36 min and 48 seconds  MEDICATIONS: 4 mg versed and 200 mcg fentanyl. A radiology nurse monitored the patient for moderate sedation.  ANESTHESIA/SEDATION: Moderate sedation time: 1 hr and 50 min  PROCEDURE: Informed consent was obtained for a right percutaneous nephrostomy tube placement. The patient was placed on her left side. The right flank was evaluated with ultrasound. The right kidney was identified. The right flank was prepped and draped in sterile fashion. Maximal barrier sterile technique was utilized including caps, mask, sterile gowns, sterile gloves, sterile drape, hand hygiene and skin antiseptic. 1% lidocaine was used for local anesthetic. A 22 gauge needle was directed into the mid pole towards a calyx. Contrast injection demonstrated filling of the renal collecting system. An Accustick dilator set was placed. Additional contrast injections were performed. The catheter appeared to be entering into the renal pelvis rather than a calyx, therefore, planned for new access. Unable to cannulate an upper pole calyx with fluoroscopic guidance. Eventually, a lower pole calyx was targeted. A 22 gauge needle was directed into a lower calyx and a wire was successfully advanced into the renal collecting system. An Accustick dilator set was placed. At this point, it was very difficult to get a wire to advance into the renal pelvis. There appeared to be narrowing or stenosis in the lower pole infundibulum. A wire perforated the collecting system on a few occasions and a small amount of contrast extravasation was  noted. After using a variety of wires and placing a 5 [place] Kumpe catheter, a Glidewire was eventually advanced into the renal pelvis and proximal ureter. Catheter was advanced over the wire and a Bentson wire  was placed. The tract was dilated and a 10 [place] drain was placed. Regular sized multipurpose drain appeared to be too large for the renal pelvis. As result, Renee Pain 10 French drain was placed. Catheter was reconstituted in the renal pelvis. Catheter was sutured to the skin and attached to gravity bag.  FINDINGS: Mild right hydronephrosis. Hydronephrosis was slightly more prominent in the lower pole collecting system. Access was obtained from a lower pole calyx but the right lower pole infundibulum is either stenotic or very torturous. It was very difficult to cannulate the renal pelvis from the lower pole access. Eventually, catheter and wire were advanced to the renal pelvis and ureter. 10 French drain reconstituted in the renal pelvis. Filling defects in the renal pelvis at the end of the study are consistent with blood clot. Contrast was draining through the ureter and contrast was identified in the bladder.  COMPLICATIONS: None  IMPRESSION: Successful placement of a right percutaneous nephrostomy tube with ultrasound and fluoroscopic guidance. The procedure was technically difficult due to the mild hydronephrosis and apparent narrowing in the right lower pole infundibulum.   Electronically Signed   By: Richarda Overlie M.D.   On: 09/15/2013 17:43      ASSESSMENT / PLAN:  PULMONARY Trach >>  A:  Acute on chronic respiratory failure 2nd to PNA. S/p trach.  - no change.  Continues full vent supprt P:   Full vent support F/u CXR  CARDIOVASCULAR PICC RT >>  A:  HTN, CAD, hx of cardiomyopathy P:  Continue BP meds  RENAL A:   AKI >> Post obstructive - s/p rt perc drain 09/15/13  - improving creat, s/p Left Perc drain 09/06/13 P:   Urology and renal following OK for discharge Leave  perc drains in place until cystoscopy/stone removal  GASTROINTESTINAL A:  Nutrition. P:   Renal diet oral  HEMATOLOGIC A:   Anemia of chronic disease, also has positive stool guaiac >> Transfused 8/9. P:  Continue darbopoietin No heparin for now until we are sure there is GI bleed, monitor Hb Transfused for hgb <7.0  INFECTIOUS  A:  PNA persumed/possible  BCx2 >> negative UC 09/12/13 >> MDR psuedomonas and providencia (S- imipenem for oth) Sputum none  UTI - confirmed 09/16/13 from urine sample 09/12/13. MDR providencia and pseudomonas  P Abx: Got zosyn + vanco in ED x 1 Rocephin 8/8>>8/12 * Cefazolin 8/11 (prior to perc drain) >>8/11 (stopped following UTI result) Plan: Imipenem 09/16/13 >> plan 10 day course  ENDOCRINE A:   No active issue P:   Monitor blood sugar  NEUROLOGIC A:   Known neuromyelitis optica P:   Monitor clinically  MISC A right eye conjuctivitis P cipro eye drops  TODAY'S SUMMARY: drains in place, needs firm plan for urology follow up. Medically ready for discharge back to [facility]   Heber Tasley, MD [facility] Pager: [phone] Cell: [phone] If no response, call [phone]   09/17/2013 10:22 AM

## 2013-09-17 NOTE — Progress Notes (Signed)
Subjective: Interval History: cold  Objective: Vital signs in last 24 hours: Temp:  [97.9 F (36.6 C)-99.8 F (37.7 C)] 99 F (37.2 C) (08/13 0406) Pulse Rate:  [67-96] 70 (08/13 0320) Resp:  [8-25] 22 (08/13 0320) BP: (139-198)/(70-94) 147/73 mmHg (08/13 0600) SpO2:  [100 %] 100 % (08/13 0320) FiO2 (%):  [30 %-100 %] 30 % (08/13 0320) Weight:  [61 kg (134 lb 7.7 oz)] 61 kg (134 lb 7.7 oz) (08/13 0500) Weight change: -0.2 kg (-7.1 oz)  Intake/Output from previous day: 08/12 0701 - 08/13 0700 In: 140 [I.V.:40; IV Piggyback:100] Out: 955 [Urine:955] Intake/Output this shift:    General appearance: cooperative and edematous Resp: rhonchi bilaterally Cardio: S1, S2 normal and systolic murmur: holosystolic 2/6, blowing at apex GI: pos bs, liver down 4 cm Extremities: edema 3-4+, contracted UE an LE  Lab Results:  Recent Labs  09/16/13 0256 09/17/13 0500  WBC 6.7 6.7  HGB 7.7* 7.4*  HCT 22.8* 23.3*  PLT 122* 129*   BMET:  Recent Labs  09/16/13 0256 09/17/13 0500  NA 141 144  K 3.7 3.4*  CL 107 109  CO2 22 21  GLUCOSE 98 80  BUN 39* 36*  CREATININE 3.39* 3.29*  CALCIUM 7.7* 7.5*   No results found for this basename: PTH,  in the last 72 hours Iron Studies: No results found for this basename: IRON, TIBC, TRANSFERRIN, FERRITIN,  in the last 72 hours  Studies/Results: Ir Perc Nephrostomy Left  09/16/2013   CLINICAL DATA:  Renal failure and status post right percutaneous nephrostomy tube placement yesterday. The left kidney contains calculi including a ureteral calculus and request has been made to now place a nephrostomy tube on the left side.  EXAM: 1. ULTRASOUND GUIDANCE FOR PUNCTURE OF THE LEFT RENAL COLLECTING SYSTEM. 2. LEFT PERCUTANEOUS NEPHROSTOMY TUBE PLACEMENT.  COMPARISON:  CT on 09/14/2013.  ANESTHESIA/SEDATION: 3.0 mg IV Versed; 100 mcg IV Fentanyl.  Total Moderate Sedation Time  25 minutes  CONTRAST:  Twenty ml Omnipaque 300  MEDICATIONS: No additional  medications.  FLUOROSCOPY TIME:  3 minutes and 30 seconds.  PROCEDURE: The procedure, risks, benefits, and alternatives were explained to the patient. Questions regarding the procedure were encouraged and answered. The patient understands and consents to the procedure.  The left flank region was prepped with Betadine in a sterile fashion, and a sterile drape was applied covering the operative field. A sterile gown and sterile gloves were used for the procedure. Local anesthesia was provided with 1% Lidocaine.  Ultrasound was used to localize the left kidney. Under direct ultrasound guidance, a 21 gauge needle was advanced into the renal collecting system. Ultrasound image documentation was performed. Aspiration of urine sample was performed followed by contrast injection.  A transitional dilator was advanced over a guidewire. Percutaneous tract dilatation was then performed over the guidewire. A 10 -French percutaneous nephrostomy tube was then advanced and formed in the collecting system. Catheter position was confirmed by fluoroscopy after contrast injection.  The catheter was secured at the skin with a Prolene retention suture and Stat-Lock device. A gravity bag was placed.  COMPLICATIONS: None.  FINDINGS: Ultrasound shows mild left hydronephrosis. Initial upper pole access was performed with contrast injection under fluoroscopy. The nephrostomy tube was ultimately placed via lower pole access. There was some hemorrhage in the collecting system after tube placement. The tube was advanced into the renal pelvis with the tip of the catheter extending just into the proximal ureter.  IMPRESSION: Successful placement of left  percutaneous nephrostomy. This will be left to gravity drainage.   Electronically Signed   By: Irish Lack M.D.   On: 09/16/2013 17:16   Ir Perc Nephrostomy Right  09/15/2013   CLINICAL DATA:  Acute renal failure with mild right hydronephrosis and left renal stones.  EXAM: RIGHT PERCUTANEOUS  NEPHROSTOMY TUBE WITH ULTRASOUND AND FLUOROSCOPIC GUIDANCE  Physician: Rachelle Hora. Henn, MD  FLUOROSCOPY TIME:  36 min and 48 seconds  MEDICATIONS: 4 mg versed and 200 mcg fentanyl. A radiology nurse monitored the patient for moderate sedation.  ANESTHESIA/SEDATION: Moderate sedation time: 1 hr and 50 min  PROCEDURE: Informed consent was obtained for a right percutaneous nephrostomy tube placement. The patient was placed on her left side. The right flank was evaluated with ultrasound. The right kidney was identified. The right flank was prepped and draped in sterile fashion. Maximal barrier sterile technique was utilized including caps, mask, sterile gowns, sterile gloves, sterile drape, hand hygiene and skin antiseptic. 1% lidocaine was used for local anesthetic. A 22 gauge needle was directed into the mid pole towards a calyx. Contrast injection demonstrated filling of the renal collecting system. An Accustick dilator set was placed. Additional contrast injections were performed. The catheter appeared to be entering into the renal pelvis rather than a calyx, therefore, planned for new access. Unable to cannulate an upper pole calyx with fluoroscopic guidance. Eventually, a lower pole calyx was targeted. A 22 gauge needle was directed into a lower calyx and a wire was successfully advanced into the renal collecting system. An Accustick dilator set was placed. At this point, it was very difficult to get a wire to advance into the renal pelvis. There appeared to be narrowing or stenosis in the lower pole infundibulum. A wire perforated the collecting system on a few occasions and a small amount of contrast extravasation was noted. After using a variety of wires and placing a 5 [place] Kumpe catheter, a Glidewire was eventually advanced into the renal pelvis and proximal ureter. Catheter was advanced over the wire and a Bentson wire was placed. The tract was dilated and a 10 [place] drain was placed. Regular sized  multipurpose drain appeared to be too large for the renal pelvis. As result, Renee Pain 10 French drain was placed. Catheter was reconstituted in the renal pelvis. Catheter was sutured to the skin and attached to gravity bag.  FINDINGS: Mild right hydronephrosis. Hydronephrosis was slightly more prominent in the lower pole collecting system. Access was obtained from a lower pole calyx but the right lower pole infundibulum is either stenotic or very torturous. It was very difficult to cannulate the renal pelvis from the lower pole access. Eventually, catheter and wire were advanced to the renal pelvis and ureter. 10 French drain reconstituted in the renal pelvis. Filling defects in the renal pelvis at the end of the study are consistent with blood clot. Contrast was draining through the ureter and contrast was identified in the bladder.  COMPLICATIONS: None  IMPRESSION: Successful placement of a right percutaneous nephrostomy tube with ultrasound and fluoroscopic guidance. The procedure was technically difficult due to the mild hydronephrosis and apparent narrowing in the right lower pole infundibulum.   Electronically Signed   By: Richarda Overlie M.D.   On: 09/15/2013 17:43   Ir US Guide Bx Asp/drain  09/16/2013   CLINICAL DATA:  Renal failure and status post right percutaneous nephrostomy tube placement yesterday. The left kidney contains calculi including a ureteral calculus and request has been made to  now place a nephrostomy tube on the left side.  EXAM: 1. ULTRASOUND GUIDANCE FOR PUNCTURE OF THE LEFT RENAL COLLECTING SYSTEM. 2. LEFT PERCUTANEOUS NEPHROSTOMY TUBE PLACEMENT.  COMPARISON:  CT on 09/14/2013.  ANESTHESIA/SEDATION: 3.0 mg IV Versed; 100 mcg IV Fentanyl.  Total Moderate Sedation Time  25 minutes  CONTRAST:  Twenty ml Omnipaque 300  MEDICATIONS: No additional medications.  FLUOROSCOPY TIME:  3 minutes and 30 seconds.  PROCEDURE: The procedure, risks, benefits, and alternatives were explained to the  patient. Questions regarding the procedure were encouraged and answered. The patient understands and consents to the procedure.  The left flank region was prepped with Betadine in a sterile fashion, and a sterile drape was applied covering the operative field. A sterile gown and sterile gloves were used for the procedure. Local anesthesia was provided with 1% Lidocaine.  Ultrasound was used to localize the left kidney. Under direct ultrasound guidance, a 21 gauge needle was advanced into the renal collecting system. Ultrasound image documentation was performed. Aspiration of urine sample was performed followed by contrast injection.  A transitional dilator was advanced over a guidewire. Percutaneous tract dilatation was then performed over the guidewire. A 10 -French percutaneous nephrostomy tube was then advanced and formed in the collecting system. Catheter position was confirmed by fluoroscopy after contrast injection.  The catheter was secured at the skin with a Prolene retention suture and Stat-Lock device. A gravity bag was placed.  COMPLICATIONS: None.  FINDINGS: Ultrasound shows mild left hydronephrosis. Initial upper pole access was performed with contrast injection under fluoroscopy. The nephrostomy tube was ultimately placed via lower pole access. There was some hemorrhage in the collecting system after tube placement. The tube was advanced into the renal pelvis with the tip of the catheter extending just into the proximal ureter.  IMPRESSION: Successful placement of left percutaneous nephrostomy. This will be left to gravity drainage.   Electronically Signed   By: Irish Lack M.D.   On: 09/16/2013 17:16   Ir US Guide Bx Asp/drain  09/15/2013   CLINICAL DATA:  Acute renal failure with mild right hydronephrosis and left renal stones.  EXAM: RIGHT PERCUTANEOUS NEPHROSTOMY TUBE WITH ULTRASOUND AND FLUOROSCOPIC GUIDANCE  Physician: Rachelle Hora. Henn, MD  FLUOROSCOPY TIME:  36 min and 48 seconds   MEDICATIONS: 4 mg versed and 200 mcg fentanyl. A radiology nurse monitored the patient for moderate sedation.  ANESTHESIA/SEDATION: Moderate sedation time: 1 hr and 50 min  PROCEDURE: Informed consent was obtained for a right percutaneous nephrostomy tube placement. The patient was placed on her left side. The right flank was evaluated with ultrasound. The right kidney was identified. The right flank was prepped and draped in sterile fashion. Maximal barrier sterile technique was utilized including caps, mask, sterile gowns, sterile gloves, sterile drape, hand hygiene and skin antiseptic. 1% lidocaine was used for local anesthetic. A 22 gauge needle was directed into the mid pole towards a calyx. Contrast injection demonstrated filling of the renal collecting system. An Accustick dilator set was placed. Additional contrast injections were performed. The catheter appeared to be entering into the renal pelvis rather than a calyx, therefore, planned for new access. Unable to cannulate an upper pole calyx with fluoroscopic guidance. Eventually, a lower pole calyx was targeted. A 22 gauge needle was directed into a lower calyx and a wire was successfully advanced into the renal collecting system. An Accustick dilator set was placed. At this point, it was very difficult to get a wire to advance into the  renal pelvis. There appeared to be narrowing or stenosis in the lower pole infundibulum. A wire perforated the collecting system on a few occasions and a small amount of contrast extravasation was noted. After using a variety of wires and placing a 5 [place] Kumpe catheter, a Glidewire was eventually advanced into the renal pelvis and proximal ureter. Catheter was advanced over the wire and a Bentson wire was placed. The tract was dilated and a 10 [place] drain was placed. Regular sized multipurpose drain appeared to be too large for the renal pelvis. As result, Renee Pain 10 French drain was placed. Catheter was  reconstituted in the renal pelvis. Catheter was sutured to the skin and attached to gravity bag.  FINDINGS: Mild right hydronephrosis. Hydronephrosis was slightly more prominent in the lower pole collecting system. Access was obtained from a lower pole calyx but the right lower pole infundibulum is either stenotic or very torturous. It was very difficult to cannulate the renal pelvis from the lower pole access. Eventually, catheter and wire were advanced to the renal pelvis and ureter. 10 French drain reconstituted in the renal pelvis. Filling defects in the renal pelvis at the end of the study are consistent with blood clot. Contrast was draining through the ureter and contrast was identified in the bladder.  COMPLICATIONS: None  IMPRESSION: Successful placement of a right percutaneous nephrostomy tube with ultrasound and fluoroscopic guidance. The procedure was technically difficult due to the mild hydronephrosis and apparent narrowing in the right lower pole infundibulum.   Electronically Signed   By: Richarda Overlie M.D.   On: 09/15/2013 17:43    I have reviewed the patient's current medications.  Assessment/Plan: 1  AKI slowly better. Has neg balance and needs to maintain, hopefully postobstructive diuresis.  Bicarb ok on po bicarb. K low replete 2 Obstructive Uropathy  ? R nephrostogram to poss remove.  Stone removal in future on L 3 Pneu  ? Duration AB 4 MS with Quadraparesis, blindness, Vent dependence 5 VDRF per CCM 6 Anemia stable at low level P Can go back to [facility] anytime from my standpoint but needs f/u of Cr.  Follow urine, K , bicarb. Goal neg fluid balance    LOS: 5 days   Amarrion Pastorino L 09/17/2013,7:30 AM

## 2013-09-17 NOTE — Progress Notes (Signed)
Report called to [facility] and given to Dorene Sorrow, [place]. [facility] called at 18:20 and will pick up pt after 19:00.

## 2013-09-17 NOTE — Discharge Summary (Signed)
Brent Koby Pickup, MD [place] [facility] Pager: [phone] Cell: [phone] If no response, call [phone]  

## 2013-09-17 NOTE — Progress Notes (Signed)
Patient ID: [name], female   DOB: 1950/06/16, 63 y.o.   MRN: [id number]    Subjective: [name] had successful placement of a left NT yesterday and the nephrostogram showed antegrade flow by a large renal pelvic stone and proximal ureteral stone.  There is at least one other stone in the kidney.   She is draining urine from that tube now and continues to drain from the right NT.  She will need a left PCNL at some point in the future.  ROS:  Review of Systems  Unable to perform ROS: intubated    Anti-infectives: Anti-infectives   Start     Dose/Rate Route Frequency Ordered Stop   09/16/13 1415  imipenem-cilastatin (PRIMAXIN) 250 mg in sodium chloride 0.9 % 100 mL IVPB     250 mg 200 mL/hr over 30 Minutes Intravenous Every 12 hours 09/16/13 1408     09/16/13 1100  ceFAZolin (ANCEF) IVPB 2 g/50 mL premix    Comments:  Hang ON CALL to xray 8/12   2 g 100 mL/hr over 30 Minutes Intravenous  Once 09/16/13 1007 09/16/13 1231   09/15/13 1100  [MAR Hold]  ceFAZolin (ANCEF) IVPB 2 g/50 mL premix     (On MAR Hold since 09/15/13 1514)  Comments:  Hang ON CALL to xray 8/11   2 g 100 mL/hr over 30 Minutes Intravenous  Once 09/15/13 0943 09/15/13 1531   09/13/13 0400  cefTRIAXone (ROCEPHIN) 1 g in dextrose 5 % 50 mL IVPB  Status:  Discontinued     1 g 100 mL/hr over 30 Minutes Intravenous Every 24 hours 09/12/13 2209 09/16/13 1408   09/12/13 1715  vancomycin (VANCOCIN) 2,000 mg in sodium chloride 0.9 % 500 mL IVPB     2,000 mg 250 mL/hr over 120 Minutes Intravenous  Once 09/12/13 1702 09/12/13 2104   09/12/13 1715  piperacillin-tazobactam (ZOSYN) IVPB 3.375 g     3.375 g 100 mL/hr over 30 Minutes Intravenous  Once 09/12/13 1702 09/12/13 1900      Current Facility-Administered Medications  Medication Dose Route Frequency Provider Last Rate Last Dose  . 0.9 %  sodium chloride infusion  250 mL Intravenous PRN Wadie LessenMario Christianto, MD 10 mL/hr at 09/15/13 2000 250 mL at 09/15/13 2000  .  albuterol (PROVENTIL) (2.5 MG/3ML) 0.083% nebulizer solution 2.5 mg  2.5 mg Nebulization Q2H PRN Wadie LessenMario Christianto, MD      . amLODipine (NORVASC) tablet 10 mg  10 mg Oral Daily Trevor IhaJames L Deterding, MD   10 mg at 09/16/13 1033  . antiseptic oral rinse (CPC / CETYLPYRIDINIUM CHLORIDE 0.05%) solution 7 mL  7 mL Mouth Rinse QID Nelda Bucksaniel J Feinstein, MD   7 mL at 09/17/13 0400  . chlorhexidine (PERIDEX) 0.12 % solution 15 mL  15 mL Mouth Rinse BID Nelda Bucksaniel J Feinstein, MD   15 mL at 09/16/13 2024  . ciprofloxacin (CILOXAN) 0.3 % ophthalmic solution 2 drop  2 drop Right Eye QID Nelda Bucksaniel J Feinstein, MD   2 drop at 09/16/13 2322  . darbepoetin (ARANESP) injection 200 mcg  200 mcg Subcutaneous Q Mon-1800 Trevor IhaJames L Deterding, MD   200 mcg at 09/14/13 1856  . docusate sodium (COLACE) capsule 100 mg  100 mg Oral BID Wadie LessenMario Christianto, MD   100 mg at 09/16/13 2322  . DULoxetine (CYMBALTA) DR capsule 30 mg  30 mg Oral Daily Coralyn HellingVineet Sood, MD   30 mg at 09/16/13 1033  . hydrALAZINE (APRESOLINE) injection 10 mg  10 mg Intravenous Q4H  PRN Wadie Lessen, MD   10 mg at 09/16/13 0422  . hydrALAZINE (APRESOLINE) tablet 100 mg  100 mg Oral 3 times per day Wadie Lessen, MD   100 mg at 09/17/13 0555  . imipenem-cilastatin (PRIMAXIN) 250 mg in sodium chloride 0.9 % 100 mL IVPB  250 mg Intravenous Q12H Kalman Shan, MD   250 mg at 09/16/13 2319  . labetalol (NORMODYNE) tablet 300 mg  300 mg Oral TID Wadie Lessen, MD   300 mg at 09/16/13 2319  . levETIRAcetam (KEPPRA) tablet 250 mg  250 mg Oral QHS Coralyn Helling, MD   250 mg at 09/16/13 2319  . levETIRAcetam (KEPPRA) tablet 500 mg  500 mg Oral Daily Coralyn Helling, MD   500 mg at 09/15/13 0930  . mupirocin ointment (BACTROBAN) 2 % 1 application  1 application Nasal BID Nelda Bucks, MD   1 application at 09/16/13 2322  . ondansetron (ZOFRAN) injection 4 mg  4 mg Intravenous Q6H PRN Wadie Lessen, MD   4 mg at 09/15/13 2100  . pantoprazole (PROTONIX) EC tablet 40  mg  40 mg Oral Daily Nelda Bucks, MD   40 mg at 09/16/13 2322  . sodium bicarbonate tablet 1,300 mg  1,300 mg Oral BID Trevor Iha, MD   1,300 mg at 09/16/13 2319  . tiZANidine (ZANAFLEX) tablet 2 mg  2 mg Oral TID Wadie Lessen, MD   2 mg at 09/16/13 2321     Objective: Vital signs in last 24 hours: Temp:  [97.9 F (36.6 C)-99.8 F (37.7 C)] 99 F (37.2 C) (08/13 0406) Pulse Rate:  [67-96] 70 (08/13 0320) Resp:  [8-25] 22 (08/13 0320) BP: (139-198)/(66-94) 174/82 mmHg (08/13 0406) SpO2:  [100 %] 100 % (08/13 0320) FiO2 (%):  [30 %-100 %] 30 % (08/13 0320) Weight:  [61 kg (134 lb 7.7 oz)] 61 kg (134 lb 7.7 oz) (08/13 0500)  Intake/Output from previous day: 08/12 0701 - 08/13 0700 In: 140 [I.V.:40; IV Piggyback:100] Out: 955 [Urine:955] Intake/Output this shift: Total I/O In: 100 [IV Piggyback:100] Out: 540 [Urine:540]   Physical Exam  Constitutional: She is well-developed, well-nourished, and in no distress.  Quadriplegic.  Alert and responsive.     Lab Results:   Recent Labs  09/16/13 0256 09/17/13 0500  WBC 6.7 6.7  HGB 7.7* 7.4*  HCT 22.8* 23.3*  PLT 122* 129*   BMET  Recent Labs  09/15/13 0400 09/16/13 0256  NA 141 141  K 3.5* 3.7  CL 106 107  CO2 22 22  GLUCOSE 84 98  BUN 41* 39*  CREATININE 3.51* 3.39*  CALCIUM 7.4* 7.7*   PT/INR  Recent Labs  09/15/13 0659  LABPROT 14.4  INR 1.12   ABG No results found for this basename: PHART, PCO2, PO2, HCO3,  in the last 72 hours  Studies/Results: Ir Perc Nephrostomy Left  09/16/2013   CLINICAL DATA:  Renal failure and status post right percutaneous nephrostomy tube placement yesterday. The left kidney contains calculi including a ureteral calculus and request has been made to now place a nephrostomy tube on the left side.  EXAM: 1. ULTRASOUND GUIDANCE FOR PUNCTURE OF THE LEFT RENAL COLLECTING SYSTEM. 2. LEFT PERCUTANEOUS NEPHROSTOMY TUBE PLACEMENT.  COMPARISON:  CT on 09/14/2013.   ANESTHESIA/SEDATION: 3.0 mg IV Versed; 100 mcg IV Fentanyl.  Total Moderate Sedation Time  25 minutes  CONTRAST:  Twenty ml Omnipaque 300  MEDICATIONS: No additional medications.  FLUOROSCOPY TIME:  3 minutes and 30 seconds.  PROCEDURE: The procedure, risks, benefits, and alternatives were explained to the patient. Questions regarding the procedure were encouraged and answered. The patient understands and consents to the procedure.  The left flank region was prepped with Betadine in a sterile fashion, and a sterile drape was applied covering the operative field. A sterile gown and sterile gloves were used for the procedure. Local anesthesia was provided with 1% Lidocaine.  Ultrasound was used to localize the left kidney. Under direct ultrasound guidance, a 21 gauge needle was advanced into the renal collecting system. Ultrasound image documentation was performed. Aspiration of urine sample was performed followed by contrast injection.  A transitional dilator was advanced over a guidewire. Percutaneous tract dilatation was then performed over the guidewire. A 10 -French percutaneous nephrostomy tube was then advanced and formed in the collecting system. Catheter position was confirmed by fluoroscopy after contrast injection.  The catheter was secured at the skin with a Prolene retention suture and Stat-Lock device. A gravity bag was placed.  COMPLICATIONS: None.  FINDINGS: Ultrasound shows mild left hydronephrosis. Initial upper pole access was performed with contrast injection under fluoroscopy. The nephrostomy tube was ultimately placed via lower pole access. There was some hemorrhage in the collecting system after tube placement. The tube was advanced into the renal pelvis with the tip of the catheter extending just into the proximal ureter.  IMPRESSION: Successful placement of left percutaneous nephrostomy. This will be left to gravity drainage.   Electronically Signed   By: Irish Lack M.D.   On: 09/16/2013  17:16   Ir Perc Nephrostomy Right  09/15/2013   CLINICAL DATA:  Acute renal failure with mild right hydronephrosis and left renal stones.  EXAM: RIGHT PERCUTANEOUS NEPHROSTOMY TUBE WITH ULTRASOUND AND FLUOROSCOPIC GUIDANCE  Physician: Rachelle Hora. Henn, MD  FLUOROSCOPY TIME:  36 min and 48 seconds  MEDICATIONS: 4 mg versed and 200 mcg fentanyl. A radiology nurse monitored the patient for moderate sedation.  ANESTHESIA/SEDATION: Moderate sedation time: 1 hr and 50 min  PROCEDURE: Informed consent was obtained for a right percutaneous nephrostomy tube placement. The patient was placed on her left side. The right flank was evaluated with ultrasound. The right kidney was identified. The right flank was prepped and draped in sterile fashion. Maximal barrier sterile technique was utilized including caps, mask, sterile gowns, sterile gloves, sterile drape, hand hygiene and skin antiseptic. 1% lidocaine was used for local anesthetic. A 22 gauge needle was directed into the mid pole towards a calyx. Contrast injection demonstrated filling of the renal collecting system. An Accustick dilator set was placed. Additional contrast injections were performed. The catheter appeared to be entering into the renal pelvis rather than a calyx, therefore, planned for new access. Unable to cannulate an upper pole calyx with fluoroscopic guidance. Eventually, a lower pole calyx was targeted. A 22 gauge needle was directed into a lower calyx and a wire was successfully advanced into the renal collecting system. An Accustick dilator set was placed. At this point, it was very difficult to get a wire to advance into the renal pelvis. There appeared to be narrowing or stenosis in the lower pole infundibulum. A wire perforated the collecting system on a few occasions and a small amount of contrast extravasation was noted. After using a variety of wires and placing a 5 [place] Kumpe catheter, a Glidewire was eventually advanced into the renal pelvis  and proximal ureter. Catheter was advanced over the wire and a Bentson wire was placed. The tract was dilated and  a 10 French drain was placed. Regular sized multipurpose drain appeared to be too large for the renal pelvis. As result, Renee Pain 10 French drain was placed. Catheter was reconstituted in the renal pelvis. Catheter was sutured to the skin and attached to gravity bag.  FINDINGS: Mild right hydronephrosis. Hydronephrosis was slightly more prominent in the lower pole collecting system. Access was obtained from a lower pole calyx but the right lower pole infundibulum is either stenotic or very torturous. It was very difficult to cannulate the renal pelvis from the lower pole access. Eventually, catheter and wire were advanced to the renal pelvis and ureter. 10 French drain reconstituted in the renal pelvis. Filling defects in the renal pelvis at the end of the study are consistent with blood clot. Contrast was draining through the ureter and contrast was identified in the bladder.  COMPLICATIONS: None  IMPRESSION: Successful placement of a right percutaneous nephrostomy tube with ultrasound and fluoroscopic guidance. The procedure was technically difficult due to the mild hydronephrosis and apparent narrowing in the right lower pole infundibulum.   Electronically Signed   By: Richarda Overlie M.D.   On: 09/15/2013 17:43   Ir US Guide Bx Asp/drain  09/16/2013   CLINICAL DATA:  Renal failure and status post right percutaneous nephrostomy tube placement yesterday. The left kidney contains calculi including a ureteral calculus and request has been made to now place a nephrostomy tube on the left side.  EXAM: 1. ULTRASOUND GUIDANCE FOR PUNCTURE OF THE LEFT RENAL COLLECTING SYSTEM. 2. LEFT PERCUTANEOUS NEPHROSTOMY TUBE PLACEMENT.  COMPARISON:  CT on 09/14/2013.  ANESTHESIA/SEDATION: 3.0 mg IV Versed; 100 mcg IV Fentanyl.  Total Moderate Sedation Time  25 minutes  CONTRAST:  Twenty ml Omnipaque 300  MEDICATIONS:  No additional medications.  FLUOROSCOPY TIME:  3 minutes and 30 seconds.  PROCEDURE: The procedure, risks, benefits, and alternatives were explained to the patient. Questions regarding the procedure were encouraged and answered. The patient understands and consents to the procedure.  The left flank region was prepped with Betadine in a sterile fashion, and a sterile drape was applied covering the operative field. A sterile gown and sterile gloves were used for the procedure. Local anesthesia was provided with 1% Lidocaine.  Ultrasound was used to localize the left kidney. Under direct ultrasound guidance, a 21 gauge needle was advanced into the renal collecting system. Ultrasound image documentation was performed. Aspiration of urine sample was performed followed by contrast injection.  A transitional dilator was advanced over a guidewire. Percutaneous tract dilatation was then performed over the guidewire. A 10 -French percutaneous nephrostomy tube was then advanced and formed in the collecting system. Catheter position was confirmed by fluoroscopy after contrast injection.  The catheter was secured at the skin with a Prolene retention suture and Stat-Lock device. A gravity bag was placed.  COMPLICATIONS: None.  FINDINGS: Ultrasound shows mild left hydronephrosis. Initial upper pole access was performed with contrast injection under fluoroscopy. The nephrostomy tube was ultimately placed via lower pole access. There was some hemorrhage in the collecting system after tube placement. The tube was advanced into the renal pelvis with the tip of the catheter extending just into the proximal ureter.  IMPRESSION: Successful placement of left percutaneous nephrostomy. This will be left to gravity drainage.   Electronically Signed   By: Irish Lack M.D.   On: 09/16/2013 17:16   Ir US Guide Bx Asp/drain  09/15/2013   CLINICAL DATA:  Acute renal failure with mild right hydronephrosis and  left renal stones.  EXAM: RIGHT  PERCUTANEOUS NEPHROSTOMY TUBE WITH ULTRASOUND AND FLUOROSCOPIC GUIDANCE  Physician: Rachelle Hora. Henn, MD  FLUOROSCOPY TIME:  36 min and 48 seconds  MEDICATIONS: 4 mg versed and 200 mcg fentanyl. A radiology nurse monitored the patient for moderate sedation.  ANESTHESIA/SEDATION: Moderate sedation time: 1 hr and 50 min  PROCEDURE: Informed consent was obtained for a right percutaneous nephrostomy tube placement. The patient was placed on her left side. The right flank was evaluated with ultrasound. The right kidney was identified. The right flank was prepped and draped in sterile fashion. Maximal barrier sterile technique was utilized including caps, mask, sterile gowns, sterile gloves, sterile drape, hand hygiene and skin antiseptic. 1% lidocaine was used for local anesthetic. A 22 gauge needle was directed into the mid pole towards a calyx. Contrast injection demonstrated filling of the renal collecting system. An Accustick dilator set was placed. Additional contrast injections were performed. The catheter appeared to be entering into the renal pelvis rather than a calyx, therefore, planned for new access. Unable to cannulate an upper pole calyx with fluoroscopic guidance. Eventually, a lower pole calyx was targeted. A 22 gauge needle was directed into a lower calyx and a wire was successfully advanced into the renal collecting system. An Accustick dilator set was placed. At this point, it was very difficult to get a wire to advance into the renal pelvis. There appeared to be narrowing or stenosis in the lower pole infundibulum. A wire perforated the collecting system on a few occasions and a small amount of contrast extravasation was noted. After using a variety of wires and placing a 5 [place] Kumpe catheter, a Glidewire was eventually advanced into the renal pelvis and proximal ureter. Catheter was advanced over the wire and a Bentson wire was placed. The tract was dilated and a 10 [place] drain was placed. Regular  sized multipurpose drain appeared to be too large for the renal pelvis. As result, Renee Pain 10 French drain was placed. Catheter was reconstituted in the renal pelvis. Catheter was sutured to the skin and attached to gravity bag.  FINDINGS: Mild right hydronephrosis. Hydronephrosis was slightly more prominent in the lower pole collecting system. Access was obtained from a lower pole calyx but the right lower pole infundibulum is either stenotic or very torturous. It was very difficult to cannulate the renal pelvis from the lower pole access. Eventually, catheter and wire were advanced to the renal pelvis and ureter. 10 French drain reconstituted in the renal pelvis. Filling defects in the renal pelvis at the end of the study are consistent with blood clot. Contrast was draining through the ureter and contrast was identified in the bladder.  COMPLICATIONS: None  IMPRESSION: Successful placement of a right percutaneous nephrostomy tube with ultrasound and fluoroscopic guidance. The procedure was technically difficult due to the mild hydronephrosis and apparent narrowing in the right lower pole infundibulum.   Electronically Signed   By: Richarda Overlie M.D.   On: 09/15/2013 17:43   I have reviewed her most recent labs and IR images from the left NT placement.    I have discussed her case with Dr. Darrick Penna.  Assessment: She had successful placement of a left NT and has 2 renal and 1 large left ureteral stone that will need treatment.  Her right NT continues to drain but it could probably be removed since there was no obvious obstruction on her initial contrast study.     Plan: I will need to arrange  scheduling for the left percutaneous nephrolithotomy but that will not be during this admission as my upcoming schedule is full and she will have to be done at [facility] hospital.    She could potentially have the right nephrostomy removed, but I would suggest a nephrostogram prior to removal in IR.   Please order  if so desired.   I believe she would benefit from suprapubic tube placement as well and that could be potentially be done at the time of the percutaneous nephrolithotomy.  She could return to [facility] when otherwise ready for discharge.      LOS: 5 days    Jennfer Gassen J 09/17/2013

## 2013-09-17 NOTE — Progress Notes (Signed)
Trach consult performed. Pt is chronic trach & vent dependent from [facility]. Pt is ready for D/C back to [facility], but no beds available at this time. Pt has backup trach & all emergency equipment at bedside. No education needed.  Jacqulynn Cadet RRT

## 2013-09-17 NOTE — Trach Care Team (Signed)
Trach Care Progression Note   Patient Details Name: [name] MRN: [id number] DOB: 1950/05/11 Today's Date: 09/17/2013   Tracheostomy Assessment    Tracheostomy Portex 6 mm Cuffed (Active)  Status Secured 09/17/2013 12:21 PM  Site Assessment Clean;Dry 09/17/2013 12:21 PM  Site Care Cleansed;Dried;Dressing applied 09/17/2013  5:30 AM  Inner Cannula Care No inner cannula 09/15/2013 10:00 PM  Ties Assessment Clean;Dry;Secure 09/17/2013 12:21 PM  Cuff pressure (cm) 28 cm 09/17/2013  8:04 AM  Emergency Equipment at bedside Yes 09/17/2013 12:21 PM     Care Needs     Respiratory Therapy Tracheostomy: Chronic trach O2 Device: Ventilator FiO2 (%): 30 % SpO2: 100 % Education: Not applicable Respiratory barriers to progression: Other (comment) (chronic vent dependence)    Speech Language Pathology  SLP chart review complete: Patient not ready for SLP services (pt. on ventilator)   Physical Therapy      Occupational Therapy      Nutritional Patient's Current Diet: Renal    Case Management/Social Work  From [facility] and plan is to return when bed available.    Provider [facility] Care Team Recommendations             [facility] Members -  [place], SLP, [place], SW, [place], [place] and [name], RT        None          [name], [name] (scribe for team) 09/17/2013, 2:28 PM

## 2013-09-17 NOTE — Progress Notes (Addendum)
Patient transferred to 2 C.  CSW received call from [place], [facility] that patient is medically stable per MD to return to [facility]. CSW contacted Natya Marrese at [facility]. They do not have any openings as of this time. They want to accept patient back however and are aware of patient. [facility] SN[facility] admissions staff will monitor and notify CSW once a bed comes available. Notified RNCM of above.    Lorri Frederick. Jaci Lazier, [place]  [phone]

## 2013-09-19 LAB — CULTURE, BLOOD (ROUTINE X 2)
CULTURE: NO GROWTH
Culture: NO GROWTH

## 2013-09-21 ENCOUNTER — Other Ambulatory Visit: Payer: Self-pay | Admitting: Urology

## 2013-11-09 ENCOUNTER — Inpatient Hospital Stay (HOSPITAL_COMMUNITY)
Admission: RE | Admit: 2013-11-09 | Discharge: 2013-11-19 | DRG: 668 | Disposition: A | Discharge status: Skilled Nursing Facility | Payer: Medicare Other | Source: Ambulatory Visit | Attending: Pulmonary Disease | Admitting: Pulmonary Disease

## 2013-11-09 DIAGNOSIS — E871 Hypo-osmolality and hyponatremia: Secondary | ICD-10-CM | POA: Diagnosis present

## 2013-11-09 DIAGNOSIS — E8809 Other disorders of plasma-protein metabolism, not elsewhere classified: Secondary | ICD-10-CM

## 2013-11-09 DIAGNOSIS — G35 Multiple sclerosis: Secondary | ICD-10-CM | POA: Diagnosis present

## 2013-11-09 DIAGNOSIS — I429 Cardiomyopathy, unspecified: Secondary | ICD-10-CM | POA: Diagnosis present

## 2013-11-09 DIAGNOSIS — F419 Anxiety disorder, unspecified: Secondary | ICD-10-CM | POA: Diagnosis present

## 2013-11-09 DIAGNOSIS — N138 Other obstructive and reflux uropathy: Secondary | ICD-10-CM | POA: Diagnosis present

## 2013-11-09 DIAGNOSIS — N139 Obstructive and reflux uropathy, unspecified: Secondary | ICD-10-CM

## 2013-11-09 DIAGNOSIS — N390 Urinary tract infection, site not specified: Secondary | ICD-10-CM | POA: Diagnosis present

## 2013-11-09 DIAGNOSIS — F329 Major depressive disorder, single episode, unspecified: Secondary | ICD-10-CM | POA: Diagnosis present

## 2013-11-09 DIAGNOSIS — I1310 Hypertensive heart and chronic kidney disease without heart failure, with stage 1 through stage 4 chronic kidney disease, or unspecified chronic kidney disease: Secondary | ICD-10-CM | POA: Diagnosis present

## 2013-11-09 DIAGNOSIS — G360 Neuromyelitis optica [Devic]: Secondary | ICD-10-CM | POA: Diagnosis present

## 2013-11-09 DIAGNOSIS — N319 Neuromuscular dysfunction of bladder, unspecified: Secondary | ICD-10-CM | POA: Diagnosis present

## 2013-11-09 DIAGNOSIS — J869 Pyothorax without fistula: Secondary | ICD-10-CM

## 2013-11-09 DIAGNOSIS — E8770 Fluid overload, unspecified: Secondary | ICD-10-CM | POA: Diagnosis present

## 2013-11-09 DIAGNOSIS — J9690 Respiratory failure, unspecified, unspecified whether with hypoxia or hypercapnia: Secondary | ICD-10-CM

## 2013-11-09 DIAGNOSIS — E872 Acidosis: Secondary | ICD-10-CM | POA: Diagnosis present

## 2013-11-09 DIAGNOSIS — L89152 Pressure ulcer of sacral region, stage 2: Secondary | ICD-10-CM | POA: Diagnosis present

## 2013-11-09 DIAGNOSIS — B961 Klebsiella pneumoniae [K. pneumoniae] as the cause of diseases classified elsewhere: Secondary | ICD-10-CM | POA: Diagnosis present

## 2013-11-09 DIAGNOSIS — R569 Unspecified convulsions: Secondary | ICD-10-CM | POA: Diagnosis present

## 2013-11-09 DIAGNOSIS — Z1624 Resistance to multiple antibiotics: Secondary | ICD-10-CM | POA: Diagnosis present

## 2013-11-09 DIAGNOSIS — E46 Unspecified protein-calorie malnutrition: Secondary | ICD-10-CM | POA: Diagnosis present

## 2013-11-09 DIAGNOSIS — G8250 Quadriplegia, unspecified: Secondary | ICD-10-CM | POA: Diagnosis present

## 2013-11-09 DIAGNOSIS — N1330 Unspecified hydronephrosis: Secondary | ICD-10-CM | POA: Diagnosis present

## 2013-11-09 DIAGNOSIS — Z87442 Personal history of urinary calculi: Secondary | ICD-10-CM | POA: Diagnosis not present

## 2013-11-09 DIAGNOSIS — N202 Calculus of kidney with calculus of ureter: Secondary | ICD-10-CM | POA: Diagnosis present

## 2013-11-09 DIAGNOSIS — I10 Essential (primary) hypertension: Secondary | ICD-10-CM

## 2013-11-09 DIAGNOSIS — N179 Acute kidney failure, unspecified: Secondary | ICD-10-CM

## 2013-11-09 DIAGNOSIS — A498 Other bacterial infections of unspecified site: Secondary | ICD-10-CM

## 2013-11-09 DIAGNOSIS — Z888 Allergy status to other drugs, medicaments and biological substances status: Secondary | ICD-10-CM | POA: Diagnosis not present

## 2013-11-09 DIAGNOSIS — N170 Acute kidney failure with tubular necrosis: Secondary | ICD-10-CM

## 2013-11-09 DIAGNOSIS — J90 Pleural effusion, not elsewhere classified: Secondary | ICD-10-CM | POA: Diagnosis present

## 2013-11-09 DIAGNOSIS — G8194 Hemiplegia, unspecified affecting left nondominant side: Secondary | ICD-10-CM | POA: Diagnosis present

## 2013-11-09 DIAGNOSIS — L89323 Pressure ulcer of left buttock, stage 3: Secondary | ICD-10-CM | POA: Diagnosis present

## 2013-11-09 DIAGNOSIS — Z6830 Body mass index (BMI) 30.0-30.9, adult: Secondary | ICD-10-CM | POA: Diagnosis not present

## 2013-11-09 DIAGNOSIS — F950 Transient tic disorder: Secondary | ICD-10-CM

## 2013-11-09 DIAGNOSIS — Z79899 Other long term (current) drug therapy: Secondary | ICD-10-CM | POA: Diagnosis not present

## 2013-11-09 DIAGNOSIS — H540 Blindness, both eyes: Secondary | ICD-10-CM | POA: Diagnosis present

## 2013-11-09 DIAGNOSIS — Z930 Tracheostomy status: Secondary | ICD-10-CM

## 2013-11-09 DIAGNOSIS — Z9911 Dependence on respirator [ventilator] status: Secondary | ICD-10-CM | POA: Diagnosis not present

## 2013-11-09 DIAGNOSIS — J9610 Chronic respiratory failure, unspecified whether with hypoxia or hypercapnia: Secondary | ICD-10-CM | POA: Diagnosis present

## 2013-11-09 DIAGNOSIS — Z8619 Personal history of other infectious and parasitic diseases: Secondary | ICD-10-CM

## 2013-11-09 DIAGNOSIS — Z113 Encounter for screening for infections with a predominantly sexual mode of transmission: Secondary | ICD-10-CM

## 2013-11-09 DIAGNOSIS — B9689 Other specified bacterial agents as the cause of diseases classified elsewhere: Secondary | ICD-10-CM | POA: Diagnosis present

## 2013-11-09 DIAGNOSIS — I2510 Atherosclerotic heart disease of native coronary artery without angina pectoris: Secondary | ICD-10-CM | POA: Diagnosis present

## 2013-11-09 DIAGNOSIS — N184 Chronic kidney disease, stage 4 (severe): Secondary | ICD-10-CM | POA: Diagnosis present

## 2013-11-09 DIAGNOSIS — J189 Pneumonia, unspecified organism: Secondary | ICD-10-CM

## 2013-11-09 DIAGNOSIS — J9620 Acute and chronic respiratory failure, unspecified whether with hypoxia or hypercapnia: Secondary | ICD-10-CM

## 2013-11-09 DIAGNOSIS — B965 Pseudomonas (aeruginosa) (mallei) (pseudomallei) as the cause of diseases classified elsewhere: Secondary | ICD-10-CM | POA: Diagnosis present

## 2013-11-09 DIAGNOSIS — J9612 Chronic respiratory failure with hypercapnia: Secondary | ICD-10-CM

## 2013-11-09 DIAGNOSIS — J9621 Acute and chronic respiratory failure with hypoxia: Secondary | ICD-10-CM

## 2013-11-09 DIAGNOSIS — L89150 Pressure ulcer of sacral region, unstageable: Secondary | ICD-10-CM

## 2013-11-09 DIAGNOSIS — H109 Unspecified conjunctivitis: Secondary | ICD-10-CM

## 2013-11-09 DIAGNOSIS — D638 Anemia in other chronic diseases classified elsewhere: Secondary | ICD-10-CM

## 2013-11-09 DIAGNOSIS — Z9289 Personal history of other medical treatment: Secondary | ICD-10-CM

## 2013-11-09 DIAGNOSIS — Z87440 Personal history of urinary (tract) infections: Secondary | ICD-10-CM | POA: Diagnosis not present

## 2013-11-09 LAB — MRSA PCR SCREENING: MRSA BY PCR: POSITIVE — AB

## 2013-11-09 LAB — BLOOD GAS, ARTERIAL
Acid-base deficit: 4.1 mmol/L — ABNORMAL HIGH (ref 0.0–2.0)
Bicarbonate: 19.6 mEq/L — ABNORMAL LOW (ref 20.0–24.0)
Drawn by: 347671
FIO2: 0.3 %
LHR: 20 {breaths}/min
MECHVT: 500 mL
O2 Saturation: 98.5 %
PATIENT TEMPERATURE: 98.6
PEEP/CPAP: 5 cmH2O
PH ART: 7.396 (ref 7.350–7.450)
TCO2: 18.3 mmol/L (ref 0–100)
pCO2 arterial: 32.6 mmHg — ABNORMAL LOW (ref 35.0–45.0)
pO2, Arterial: 123 mmHg — ABNORMAL HIGH (ref 80.0–100.0)

## 2013-11-09 LAB — PHOSPHORUS: Phosphorus: 4.9 mg/dL — ABNORMAL HIGH (ref 2.3–4.6)

## 2013-11-09 LAB — URINALYSIS, ROUTINE W REFLEX MICROSCOPIC
Bilirubin Urine: NEGATIVE
Glucose, UA: NEGATIVE mg/dL
KETONES UR: NEGATIVE mg/dL
Nitrite: NEGATIVE
Protein, ur: >300 mg/dL — AB
SPECIFIC GRAVITY, URINE: 1.018 (ref 1.005–1.030)
UROBILINOGEN UA: 0.2 mg/dL (ref 0.0–1.0)
pH: 7 (ref 5.0–8.0)

## 2013-11-09 LAB — COMPREHENSIVE METABOLIC PANEL
ALBUMIN: 1.6 g/dL — AB (ref 3.5–5.2)
ALT: 8 U/L (ref 0–35)
AST: 10 U/L (ref 0–37)
Alkaline Phosphatase: 140 U/L — ABNORMAL HIGH (ref 39–117)
Anion gap: 12 (ref 5–15)
BUN: 50 mg/dL — AB (ref 6–23)
CO2: 20 mEq/L (ref 19–32)
CREATININE: 2.58 mg/dL — AB (ref 0.50–1.10)
Calcium: 8 mg/dL — ABNORMAL LOW (ref 8.4–10.5)
Chloride: 99 mEq/L (ref 96–112)
GFR calc Af Amer: 21 mL/min — ABNORMAL LOW (ref >90)
GFR calc non Af Amer: 19 mL/min — ABNORMAL LOW (ref >90)
Glucose, Bld: 98 mg/dL (ref 70–99)
Potassium: 4.2 mEq/L (ref 3.7–5.3)
Sodium: 131 mEq/L — ABNORMAL LOW (ref 137–147)
Total Bilirubin: 0.3 mg/dL (ref 0.3–1.2)
Total Protein: 6.3 g/dL (ref 6.0–8.3)

## 2013-11-09 LAB — CBC WITH DIFFERENTIAL/PLATELET
BASOS ABS: 0 10*3/uL (ref 0.0–0.1)
BASOS PCT: 0 % (ref 0–1)
EOS PCT: 1 % (ref 0–5)
Eosinophils Absolute: 0.2 10*3/uL (ref 0.0–0.7)
HCT: 25.2 % — ABNORMAL LOW (ref 36.0–46.0)
Hemoglobin: 8.3 g/dL — ABNORMAL LOW (ref 12.0–15.0)
LYMPHS PCT: 8 % — AB (ref 12–46)
Lymphs Abs: 1.2 10*3/uL (ref 0.7–4.0)
MCH: 30 pg (ref 26.0–34.0)
MCHC: 32.9 g/dL (ref 30.0–36.0)
MCV: 91 fL (ref 78.0–100.0)
Monocytes Absolute: 0.9 10*3/uL (ref 0.1–1.0)
Monocytes Relative: 6 % (ref 3–12)
NEUTROS ABS: 12.7 10*3/uL — AB (ref 1.7–7.7)
Neutrophils Relative %: 85 % — ABNORMAL HIGH (ref 43–77)
PLATELETS: 188 10*3/uL (ref 150–400)
RBC: 2.77 MIL/uL — ABNORMAL LOW (ref 3.87–5.11)
RDW: 16.5 % — AB (ref 11.5–15.5)
WBC: 15.1 10*3/uL — AB (ref 4.0–10.5)

## 2013-11-09 LAB — ABO/RH: ABO/RH(D): A POS

## 2013-11-09 LAB — MAGNESIUM: MAGNESIUM: 2 mg/dL (ref 1.5–2.5)

## 2013-11-09 LAB — PROTIME-INR
INR: 1.26 (ref 0.00–1.49)
Prothrombin Time: 15.9 seconds — ABNORMAL HIGH (ref 11.6–15.2)

## 2013-11-09 LAB — APTT: aPTT: 38 seconds — ABNORMAL HIGH (ref 24–37)

## 2013-11-09 LAB — PROCALCITONIN: PROCALCITONIN: 1.35 ng/mL

## 2013-11-09 LAB — URINE MICROSCOPIC-ADD ON

## 2013-11-09 MED ORDER — LEVETIRACETAM 100 MG/ML PO SOLN
500.0000 mg | Freq: Two times a day (BID) | ORAL | Status: DC
  Filled 2013-11-09: qty 5

## 2013-11-09 MED ORDER — CHLORHEXIDINE GLUCONATE 0.12 % MT SOLN
15.0000 mL | Freq: Two times a day (BID) | OROMUCOSAL | Status: DC
  Administered 2013-11-09 – 2013-11-19 (×19): 15 mL via OROMUCOSAL
  Filled 2013-11-09 (×18): qty 15

## 2013-11-09 MED ORDER — CLONIDINE HCL 0.1 MG PO TABS
0.1000 mg | ORAL_TABLET | Freq: Four times a day (QID) | ORAL | Status: DC | PRN
  Administered 2013-11-09: 0.1 mg via ORAL
  Filled 2013-11-09: qty 1

## 2013-11-09 MED ORDER — SODIUM CHLORIDE 0.9 % IV SOLN
250.0000 mg | Freq: Two times a day (BID) | INTRAVENOUS | Status: DC
  Administered 2013-11-10 – 2013-11-15 (×11): 250 mg via INTRAVENOUS
  Filled 2013-11-09 (×12): qty 250

## 2013-11-09 MED ORDER — CHLORHEXIDINE GLUCONATE 0.12 % MT SOLN: 15.0000 mL | Freq: Two times a day (BID) | OROMUCOSAL | Status: DC

## 2013-11-09 MED ORDER — CIPROFLOXACIN HCL 0.3 % OP SOLN
2.0000 [drp] | Freq: Four times a day (QID) | OPHTHALMIC | Status: DC
  Administered 2013-11-09 – 2013-11-19 (×36): 2 [drp] via OPHTHALMIC
  Filled 2013-11-09 (×2): qty 2.5

## 2013-11-09 MED ORDER — LABETALOL HCL 200 MG PO TABS: 300.0000 mg | ORAL_TABLET | Freq: Three times a day (TID) | ORAL | Status: DC

## 2013-11-09 MED ORDER — PANTOPRAZOLE SODIUM 40 MG PO PACK
40.0000 mg | PACK | Freq: Every day | ORAL | Status: DC
  Administered 2013-11-11 – 2013-11-19 (×8): 40 mg
  Filled 2013-11-09 (×12): qty 20

## 2013-11-09 MED ORDER — OXYCODONE HCL 5 MG PO TABS
5.0000 mg | ORAL_TABLET | ORAL | Status: DC | PRN
  Administered 2013-11-16: 5 mg via ORAL
  Filled 2013-11-09: qty 1

## 2013-11-09 MED ORDER — VANCOMYCIN HCL 500 MG IV SOLR
500.0000 mg | INTRAVENOUS | Status: DC
  Administered 2013-11-10 – 2013-11-12 (×3): 500 mg via INTRAVENOUS
  Filled 2013-11-09 (×3): qty 500

## 2013-11-09 MED ORDER — FAMOTIDINE 20 MG PO TABS
20.0000 mg | ORAL_TABLET | Freq: Every day | ORAL | Status: DC
  Administered 2013-11-11 – 2013-11-19 (×8): 20 mg via ORAL
  Filled 2013-11-09 (×8): qty 1

## 2013-11-09 MED ORDER — SODIUM CHLORIDE 0.9 % IV SOLN
500.0000 mg | Freq: Once | INTRAVENOUS | Status: AC
  Administered 2013-11-09: 500 mg via INTRAVENOUS
  Filled 2013-11-09: qty 500

## 2013-11-09 MED ORDER — CARBAMAZEPINE 100 MG PO CHEW
100.0000 mg | CHEWABLE_TABLET | Freq: Two times a day (BID) | ORAL | Status: DC
  Administered 2013-11-09 – 2013-11-19 (×18): 100 mg via ORAL
  Filled 2013-11-09 (×21): qty 1

## 2013-11-09 MED ORDER — PANTOPRAZOLE SODIUM 40 MG PO TBEC: 40.0000 mg | DELAYED_RELEASE_TABLET | Freq: Every day | ORAL | Status: DC

## 2013-11-09 MED ORDER — ISOSORBIDE DINITRATE 20 MG PO TABS
20.0000 mg | ORAL_TABLET | Freq: Three times a day (TID) | ORAL | Status: DC
  Administered 2013-11-09 – 2013-11-19 (×27): 20 mg via ORAL
  Filled 2013-11-09 (×31): qty 1

## 2013-11-09 MED ORDER — DOCUSATE SODIUM 100 MG PO CAPS
100.0000 mg | ORAL_CAPSULE | Freq: Two times a day (BID) | ORAL | Status: DC
  Filled 2013-11-09: qty 1

## 2013-11-09 MED ORDER — DEXTROSE-NACL 5-0.45 % IV SOLN
INTRAVENOUS | Status: DC
  Administered 2013-11-09: 16:00:00 via INTRAVENOUS
  Administered 2013-11-10: 1000 mL via INTRAVENOUS
  Administered 2013-11-11 – 2013-11-12 (×2): via INTRAVENOUS

## 2013-11-09 MED ORDER — LEVETIRACETAM 500 MG PO TABS: 500.0000 mg | ORAL_TABLET | Freq: Every day | ORAL | Status: DC

## 2013-11-09 MED ORDER — LABETALOL HCL 200 MG PO TABS
600.0000 mg | ORAL_TABLET | Freq: Two times a day (BID) | ORAL | Status: DC
  Administered 2013-11-09 – 2013-11-19 (×18): 600 mg via ORAL
  Filled 2013-11-09 (×15): qty 3
  Filled 2013-11-09: qty 6
  Filled 2013-11-09 (×2): qty 3

## 2013-11-09 MED ORDER — LEVETIRACETAM 250 MG PO TABS: 250.0000 mg | ORAL_TABLET | Freq: Every day | ORAL | Status: DC

## 2013-11-09 MED ORDER — ADULT MULTIVITAMIN W/MINERALS CH
1.0000 | ORAL_TABLET | Freq: Every day | ORAL | Status: DC
  Administered 2013-11-11 – 2013-11-19 (×8): 1 via ORAL
  Filled 2013-11-09 (×8): qty 1

## 2013-11-09 MED ORDER — FERROUS SULFATE 325 (65 FE) MG PO TABS: 325.0000 mg | ORAL_TABLET | Freq: Two times a day (BID) | ORAL | Status: DC

## 2013-11-09 MED ORDER — CLONIDINE HCL 0.3 MG PO TABS
0.3000 mg | ORAL_TABLET | Freq: Three times a day (TID) | ORAL | Status: DC
  Administered 2013-11-09 – 2013-11-19 (×27): 0.3 mg via ORAL
  Filled 2013-11-09 (×27): qty 1

## 2013-11-09 MED ORDER — DOXAZOSIN MESYLATE 4 MG PO TABS
4.0000 mg | ORAL_TABLET | Freq: Two times a day (BID) | ORAL | Status: DC
  Administered 2013-11-09 – 2013-11-19 (×18): 4 mg via ORAL
  Filled 2013-11-09 (×21): qty 1

## 2013-11-09 MED ORDER — CIPROFLOXACIN IN D5W 400 MG/200ML IV SOLN: 400.0000 mg | INTRAVENOUS | Status: DC

## 2013-11-09 MED ORDER — FENTANYL CITRATE 0.05 MG/ML IJ SOLN
100.0000 ug | INTRAMUSCULAR | Status: DC | PRN
  Administered 2013-11-16: 100 ug via INTRAVENOUS
  Administered 2013-11-17: 50 ug via INTRAVENOUS
  Administered 2013-11-17 – 2013-11-18 (×5): 100 ug via INTRAVENOUS
  Filled 2013-11-09 (×6): qty 2

## 2013-11-09 MED ORDER — CALCIUM ACETATE 667 MG PO CAPS
1334.0000 mg | ORAL_CAPSULE | Freq: Three times a day (TID) | ORAL | Status: DC
  Administered 2013-11-10 – 2013-11-19 (×25): 1334 mg via ORAL
  Filled 2013-11-09 (×31): qty 2

## 2013-11-09 MED ORDER — PRO-STAT SUGAR FREE PO LIQD
30.0000 mL | Freq: Every day | ORAL | Status: DC
  Administered 2013-11-10 – 2013-11-17 (×8): 30 mL via ORAL
  Filled 2013-11-09 (×15): qty 30

## 2013-11-09 MED ORDER — PREGABALIN 100 MG PO CAPS
100.0000 mg | ORAL_CAPSULE | Freq: Every day | ORAL | Status: DC
  Administered 2013-11-11 – 2013-11-19 (×8): 100 mg via ORAL
  Filled 2013-11-09 (×8): qty 1

## 2013-11-09 MED ORDER — HYDRALAZINE HCL 20 MG/ML IJ SOLN
10.0000 mg | INTRAMUSCULAR | Status: DC | PRN
Start: 2013-11-09 — End: 2013-11-19
  Administered 2013-11-09 – 2013-11-19 (×4): 10 mg via INTRAVENOUS
  Filled 2013-11-09 (×4): qty 1

## 2013-11-09 MED ORDER — DOCUSATE SODIUM 50 MG/5ML PO LIQD
100.0000 mg | Freq: Two times a day (BID) | ORAL | Status: DC
  Administered 2013-11-10 – 2013-11-19 (×17): 100 mg
  Filled 2013-11-09 (×21): qty 10

## 2013-11-09 MED ORDER — SODIUM CHLORIDE 0.9 % IV SOLN
250.0000 mL | INTRAVENOUS | Status: DC | PRN
  Administered 2013-11-10: 09:00:00 via INTRAVENOUS
  Administered 2013-11-13 – 2013-11-16 (×2): 250 mL via INTRAVENOUS

## 2013-11-09 MED ORDER — HYDRALAZINE HCL 50 MG PO TABS
100.0000 mg | ORAL_TABLET | Freq: Three times a day (TID) | ORAL | Status: DC
  Administered 2013-11-09 (×2): 100 mg via ORAL
  Filled 2013-11-09 (×2): qty 2

## 2013-11-09 MED ORDER — FERROUS SULFATE 325 (65 FE) MG PO TABS
325.0000 mg | ORAL_TABLET | Freq: Two times a day (BID) | ORAL | Status: DC
  Administered 2013-11-10 – 2013-11-19 (×17): 325 mg via ORAL
  Filled 2013-11-09 (×17): qty 1

## 2013-11-09 MED ORDER — CETYLPYRIDINIUM CHLORIDE 0.05 % MT LIQD
7.0000 mL | Freq: Four times a day (QID) | OROMUCOSAL | Status: DC
  Administered 2013-11-09 – 2013-11-19 (×40): 7 mL via OROMUCOSAL

## 2013-11-09 MED ORDER — LEVETIRACETAM 500 MG PO TABS
500.0000 mg | ORAL_TABLET | Freq: Two times a day (BID) | ORAL | Status: DC
  Administered 2013-11-09: 500 mg via ORAL
  Filled 2013-11-09: qty 1

## 2013-11-09 MED ORDER — ALTEPLASE 2 MG IJ SOLR
2.0000 mg | Freq: Once | INTRAMUSCULAR | Status: AC
  Administered 2013-11-09: 2 mg
  Filled 2013-11-09: qty 2

## 2013-11-09 MED ORDER — AMLODIPINE BESYLATE 10 MG PO TABS: 10.0000 mg | ORAL_TABLET | Freq: Every day | ORAL | Status: DC

## 2013-11-09 MED ORDER — TIZANIDINE HCL 2 MG PO TABS
2.0000 mg | ORAL_TABLET | Freq: Three times a day (TID) | ORAL | Status: DC
  Administered 2013-11-09 – 2013-11-19 (×27): 2 mg via ORAL
  Filled 2013-11-09 (×31): qty 1

## 2013-11-09 MED ORDER — DULOXETINE HCL 30 MG PO CPEP: 30.0000 mg | ORAL_CAPSULE | Freq: Every day | ORAL | Status: DC

## 2013-11-09 MED ORDER — NICARDIPINE HCL IN NACL 20-0.86 MG/200ML-% IV SOLN
3.0000 mg/h | INTRAVENOUS | Status: DC
  Administered 2013-11-09: 5 mg/h via INTRAVENOUS
  Administered 2013-11-10: 7.5 mg/h via INTRAVENOUS
  Administered 2013-11-10 (×2): 3 mg/h via INTRAVENOUS
  Filled 2013-11-09 (×5): qty 200

## 2013-11-09 MED ORDER — FENTANYL CITRATE 0.05 MG/ML IJ SOLN
100.0000 ug | INTRAMUSCULAR | Status: DC | PRN
  Administered 2013-11-10: 100 ug via INTRAVENOUS
  Filled 2013-11-09 (×2): qty 2

## 2013-11-09 MED ORDER — DARBEPOETIN ALFA-POLYSORBATE 40 MCG/0.4ML IJ SOLN
40.0000 ug | INTRAMUSCULAR | Status: DC
  Administered 2013-11-14: 40 ug via SUBCUTANEOUS
  Filled 2013-11-09 (×2): qty 0.4

## 2013-11-09 MED ORDER — VANCOMYCIN HCL IN DEXTROSE 1-5 GM/200ML-% IV SOLN
1000.0000 mg | Freq: Once | INTRAVENOUS | Status: AC
  Administered 2013-11-09: 1000 mg via INTRAVENOUS
  Filled 2013-11-09: qty 200

## 2013-11-09 NOTE — Consult Note (Signed)
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 Drainage (amount, consistency, odor) serous on old dressing Periwound:macerated Dressing procedure/placement/frequency:I will implement a soft silicone foam dressing with twice weekly and PRN changes.  Patinet will be turned side to side and the supine position avoided.  Bilateral heels are intact, but floating is a challenge.  I will provide bilateral pressure redistribution boots.WOC nursing team will not follow, but will remain available to this patient, the nursing and medical teams.  Please re-consult if needed. Thanks, Ladona Mow, MSN, RN, GNP, [place], CWON-AP [phone])

## 2013-11-09 NOTE — Progress Notes (Signed)
ANTIBIOTIC CONSULT NOTE - INITIAL  Pharmacy Consult for vancomycin and Primaxin Indication: surgical prophylaxis and UTI with Hx MDR organisms  Allergies  Allergen Reactions  . Neurontin [Gabapentin] Other (See Comments)    Per MAR, unknown  . Xanax [Alprazolam] Other (See Comments)    Per MAR, unknown    Patient Measurements: Height: 5\' 1"  (154.9 cm) Weight: 145 lb 11.6 oz (66.1 kg) IBW/kg (Calculated) : 47.8  Vital Signs: Temp: 99 F (37.2 C) (10/05 1400) Temp Source: Oral (10/05 1400) BP: 206/92 mmHg (10/05 1900) Pulse Rate: 88 (10/05 1900)  Labs:  Recent Labs  11/09/13 1506  WBC 15.1*  HGB 8.3*  PLT 188  CREATININE 2.58*   Estimated Creatinine Clearance: 19.2 ml/min (by C-G formula based on Cr of 2.58).  Medical History: Past Medical History  Diagnosis Date  . Neuromyelitis optica   . Blindness   . Hemiparesis     left sided  . Hypertension   . Cardiomyopathy   . Coronary artery disease   . Ventilator dependent   . Respiratory failure   . Depression   . Anemia   . Seizures     Medications:  Scheduled:  . antiseptic oral rinse  7 mL Mouth Rinse QID  . [START ON 11/10/2013] calcium acetate  1,334 mg Oral TID WC  . carbamazepine  100 mg Oral BID  . chlorhexidine  15 mL Mouth Rinse BID  . ciprofloxacin  2 drop Right Eye QID  . cloNIDine  0.3 mg Oral TID  . [START ON 11/14/2013] darbepoetin (ARANESP) injection - NON-DIALYSIS  40 mcg Subcutaneous Q Sat-1800  . docusate sodium  100 mg Oral BID  . doxazosin  4 mg Oral Q12H  . [START ON 11/10/2013] famotidine  20 mg Oral Daily  . [START ON 11/10/2013] feeding supplement (PRO-STAT SUGAR FREE 64)  30 mL Oral QPC supper  . [START ON 11/10/2013] ferrous sulfate  325 mg Oral BID WC  . hydrALAZINE  100 mg Oral TID  . isosorbide dinitrate  20 mg Oral TID  . labetalol  600 mg Oral BID  . levETIRAcetam  500 mg Oral BID  . [START ON 11/10/2013] multivitamin with minerals  1 tablet Oral Daily  . [START ON  11/10/2013] pantoprazole  40 mg Oral Daily  . [START ON 11/10/2013] pregabalin  100 mg Oral Daily  . tiZANidine  2 mg Oral TID   Infusions:  . dextrose 5 % and 0.45% NaCl 50 mL/hr at 11/09/13 1600  . niCARDipine     Assessment: 63 yoF admitted 10/5 from [facility] by urology in follow up of percutaneous nephrostomy tubes for possible nephrolithotomy +/- suprapubic tube placement 10/6. Has lived at [facility] for past 4 years. Previous admit in Aug for ARF in setting of obstructive uropathy and urosepsis, s/p bilateral urostomy tubes placed 8/11 and 8/12, Pseudomonas and Providencia on urine cx that admission. Pharmacy has been consulted to dose Primaxin and vancomycin for UTI and for surgical prophylaxis  Antiinfectives 10/5 >> vancomycin >> 10/5 >> primaxin >>   Labs / vitals Tmax: 99 WBCs: 15.1 Renal: SCr 2.58, CrCl 19 ml/min CG, 25 ml/min normalized  Microbiology Previous admission 8/8 blood x2: NGF 8/8 urine: Providencia stuartii (S=Priomaxin, Zosyn only)                 Pseudomonas aeruginosa (S=aminoglycosides, Primaxin only)   This admission 10/5 blood x2: IP 10/5 urine: IP (UA c/w UTI) 10/5 sputum: ordered 10/5 MRSA PCR: IP  Goal of Therapy:  Vancomycin trough level 10-15 mcg/ml Primaxin per indication and renal function  Plan:  - Primaxin 500mg  IV x1 - Primaxin 250mg  IV q12h to start 10/6 at 0400 - vancomycin 1g IV x1 - vancomycin 500mg  IV q24h to start 10/6 at 1800 - vancomycin trough at steady state if indicated - follow-up clinical course, culture results, renal function - follow-up antibiotic de-escalation and length of therapy  Thank you for the consult.  Ross LudwigJesse Moksh Loomer Akers, PharmD, BCPS Pager: [phone] Pharmacy: [phone] 11/09/2013 7:29 PM

## 2013-11-09 NOTE — H&P (Signed)
H&P  Chief Complaint: left ureteral and renal pelvis stones  History of Present Illness: Anthonette LegatoSandra Artus is a 63 y.o. female with complicated medical history of neuromyelitis optica, left-sided hemiparesis, blindness, with ventilator dependence as well as CAD, cardiomyopathy, hypertension, and seizures who was seen as a consult on 09/15/13 after being admitted on 09/12/13 with acute renal failure up to 4.5 from a baseline of 1.6 in 06/2013 and urosepsis with a left proximal ureteral stone and two non-obstructing renal stones. Right hydronephrosis was noted but no clear area of obstruction or stricture was identified. Bilateral nephrostomy tubes were eventually placed on 8/11 (R) and 8/12 (L). Her Cr was trending down to 3.29 on discharge. Urine culture eventually grew pseudomonas aeruginosa and providcia stuartii and she was started on imipenem/cilastatin for 10 days. She was discharged back to her LTAC ([facility]) on 09/17/13.  Of note, she was significantly anemic on admission. In the interim since discharge she has 2 pleural drains. She does not report any other significant changes.    Past Medical History  Diagnosis Date  . Neuromyelitis optica   . Blindness   . Hemiparesis     left sided  . Hypertension   . Cardiomyopathy   . Coronary artery disease   . Ventilator dependent   . Respiratory failure   . Depression   . Anemia   . Seizures     Past Surgical History  Procedure Laterality Date  . Tracheostomy      Home Medications:    Medication List    ASK your doctor about these medications       acetaminophen 325 MG tablet  Commonly known as:  TYLENOL  Take 650 mg by mouth every 6 (six) hours as needed for moderate pain.     amLODipine 10 MG tablet  Commonly known as:  NORVASC  Take 10 mg by mouth daily.     chlorhexidine 0.12 % solution  Commonly known as:  PERIDEX  Use as directed 15 mLs in the mouth or throat 2 (two) times daily.     ciprofloxacin 0.3 % ophthalmic  solution  Commonly known as:  CILOXAN  Place 2 drops into the right eye 4 (four) times daily. Administer 1 drop, every 2 hours, while awake, for 2 days. Then 1 drop, every 4 hours, while awake, for the next 5 days.     cloNIDine 0.1 MG tablet  Commonly known as:  CATAPRES  Take 0.1 mg by mouth every 6 (six) hours as needed (for hypertension for systolic blood pressure greater than 160).     darbepoetin 40 MCG/0.4ML Soln injection  Commonly known as:  ARANESP  Inject 40 mcg into the skin every 7 (seven) days. On Monday     DSS 100 MG Caps  Take 100 mg by mouth 2 (two) times daily.     DULoxetine 30 MG capsule  Commonly known as:  CYMBALTA  Take 30 mg by mouth daily.     ferrous sulfate 325 (65 FE) MG tablet  Take 325 mg by mouth 2 (two) times daily.     hydrALAZINE 100 MG tablet  Commonly known as:  APRESOLINE  Take 100 mg by mouth 3 (three) times daily.     imipenem-cilastatin 250 mg in sodium chloride 0.9 % 100 mL  Inject 250 mg into the vein every 12 (twelve) hours.     labetalol 300 MG tablet  Commonly known as:  NORMODYNE  Take 300 mg by mouth 3 (three) times daily.  levETIRAcetam 250 MG tablet  Commonly known as:  KEPPRA  Take 250 mg by mouth at bedtime.     levETIRAcetam 500 MG tablet  Commonly known as:  KEPPRA  Take 500 mg by mouth daily.     MULTIVITAMIN & MINERAL PO  Take 1 tablet by mouth daily.     mupirocin ointment 2 %  Commonly known as:  BACTROBAN  Place 1 application into the nose as needed (for right toe infection).     omeprazole 20 MG capsule  Commonly known as:  PRILOSEC  Take 20 mg by mouth daily.     promethazine 25 MG tablet  Commonly known as:  PHENERGAN  Take 12.5 mg by mouth every 6 (six) hours as needed for nausea or vomiting.     simethicone 80 MG chewable tablet  Commonly known as:  MYLICON  Chew 80 mg by mouth every 6 (six) hours as needed for flatulence.     sodium bicarbonate 650 MG tablet  Take 2 tablets (1,300 mg  total) by mouth 2 (two) times daily.     sodium chloride 0.9 % infusion  Inject 250 mLs into the vein as needed (if IV carrier fluid needed.).     tiZANidine 2 MG tablet  Commonly known as:  ZANAFLEX  Take 2 mg by mouth 3 (three) times daily.        Allergies:  Allergies  Allergen Reactions  . Neurontin [Gabapentin] Other (See Comments)    Per MAR, unknown  . Xanax [Alprazolam] Other (See Comments)    Per MAR, unknown    No family history on file.  Social History:  reports that she has never smoked. She does not have any smokeless tobacco history on file. She reports that she does not drink alcohol or use illicit drugs.  ROS: A complete review of systems was performed.  All systems are negative except for pertinent findings as noted.  Physical Exam:  Vital signs in last 24 hours: Pulse Rate:  [93] 93 (10/05 1412) Resp:  [20] 20 (10/05 1412) SpO2:  [100 %] 100 % (10/05 1412) FiO2 (%):  [40 %] 40 % (10/05 1409) Constitutional: Left hemiparesis and right sided weakness. She has a trach but is alert and able to mouth responses to some questions.  HENT: Head: Normocephalic and atraumatic. With periocular edema. Neck: Tracheostomy is in place. Cardiovascular: Normal rate, regular rhythm and normal heart sounds.  Pulmonary/Chest: Effort normal and breath sounds normal.  On vent  Abdominal: Bowel sounds are normal. Firm without mass or CVAT. No hernias noted.  Genitourinary: Foley indwelling. Bilateral SP tubes in place. All tubes draining clear yellow urine Musculoskeletal: Grossly edematous diffusely. Left hemiparesis with contractions of the left hand. She has weakness on the right as well.  Neurologic: Left hemiparesis Skin: Skin is warm and dry.   Laboratory Data:  No results found for this basename: WBC, HGB, HCT, PLT,  in the last 72 hours  No results found for this basename: NA, K, CL, CO3, GLUCOSE, BUN, CALCIUM, CREATININE,  in the last 72 hours   No results found  for this or any previous visit (from the past 24 hour(s)). No results found for this or any previous visit (from the past 240 hour(s)).  Renal Function: No results found for this basename: CREATININE,  in the last 168 hours The CrCl is unknown because both a height and weight (above a minimum accepted value) are required for this calculation.  Radiologic Imaging: Personally reviewed CT  scan from 09/14/13. 7-98mm left proximal ureteral stone, 32mm interpolar stone, and 2 smaller renal pelvis stones with moderate hydro and perinephric stranding. Mild right hydronephrosis.    Impression/Assessment:  Daviana Kassis is a 63 y.o. female with complicated medical history of neuromyelitis optica, left-sided hemiparesis, blindness, with ventilator dependence as well as CAD, cardiomyopathy, hypertension, and seizures who is pre-admitted for planned left PCNL and SP tube in the morning.   -- appreciate ICU management -- repeat urine culture from nephrostomy tube and foley. I -- start on imipenem given prior multidrug resistant pseudomonas & providencia UTI on 09/12/13 -- check labs (CBC, BMP, coags, T&S). Transfuse PRN -- home feeds, NPO after midnight -- resume home meds

## 2013-11-09 NOTE — Consult Note (Signed)
PULMONARY / CRITICAL CARE MEDICINE   Name: [name] LegatoSandra Paternoster MRN: [id number] DOB: [birth year]    ADMISSION DATE:  11/09/2013 CONSULTATION DATE:  10/5  REFERRING MD :   Annabell HowellsWrenn   CHIEF COMPLAINT:   Vent management and critical care medicine support   INITIAL PRESENTATION:  This is a complicated pt w/ PMH of neuromyelitis optica with blindness hemiparesis on the left side, HTN, cardiomyopathy, CAD, anemia of chronic illness, anxiety and depression, and tracheostomy w/ vent dependence. She lives in [facility] for the past 4 years. She was discharged by our service in Aug 2015 after an acute hospitalization for acute renal failure in setting of obstructive uropathy and urinary tract sepsis. During this time she required bilateral urostomy tubes placed 8/11 and 8/12. She presents 10/5 being brought in by urology for follow up of her obstructive uropathy, left percutaneous nephrolithotomy and possible suprapubic tube. PCCM asked to assist w/ supportive medical care.   STUDIES:    SIGNIFICANT EVENTS:    HISTORY OF PRESENT ILLNESS:   This is a complicated 63 year old AAF with PMH of neuromyelitis optica with blindness hemiparesis on the left side, HTN, cardiomyopathy, CAD, anemia of chronic illness, anxiety and depression, and tracheostomy w/ vent dependence. She lives in [facility] for the past 4 years. [facility] to abnormal labs reviewed patient had a bump in her creatinine up to 4.5 (last known 1.6 on MAy 2015). She also had decreased CO2 levels at 10. Hemoglobin 7.5 with positive stool guaiac. In the ED she has findings consistent with UTI, possible HCAP/VAP, also has a renal failure with severe metabolic acidosis with resp compensation. Her renal US showed non-obstructive left renal calculi, mild hydronephrosis on right, probable cystitis. Nephrology was consulted. She was treated empirically w/ IV antibiotics and IVFs. Also given bicarbonate supplementation. Urine culture from 8/8  grew out BOTH pseudomonas aeruginosa and providcia stuartii >100K. The Providencia was multidrug resistant. The Pseudomonas was sensitive only to imipenem and gentamycin and tobramycin. These results were finalized on 8/12 and we started her on Primaxin because of this. At time of d/c her creatinine had improved, acidosis better, and seems to be close to baseline, but we were concerned about the finding of her right hydronephrosis and the fact that we could not fully explain the etiology of her renal failure. Because of this we consulted Urology after discussing this with the consulting nephrology team. Urology saw the pt in consult on 8/11. Their recommendations were: to go forward with right percutaneous nephrostomy tube. This was carried out on 8/11. Her creatinine had continued to improve some but not significantly and because she had known ureteral stones on the left Urology felt she would also benefit from left nephrostomy tube which she would need anyway for removal of renal and ureteral stones. Nephrology was in agreement with this and left nephrostomy tube was placed 8/12. She has now been brought in by Urology on 10/5 for follow up of her obstructive uropathy, left percutaneous nephrolithotomy and possible suprapubic tube. PCCM asked to assist w/ supportive medical care.    PAST MEDICAL HISTORY :   has a past medical history of Neuromyelitis optica; Blindness; Hemiparesis; Hypertension; Cardiomyopathy; Coronary artery disease; Ventilator dependent; Respiratory failure; Depression; Anemia; and Seizures.  has past surgical history that includes Tracheostomy. Prior to Admission medications   Medication Sig Start Date End Date Taking? Authorizing Provider  acetaminophen (TYLENOL) 325 MG tablet Take 650 mg by mouth every 6 (six) hours as needed for  moderate pain.    Historical Provider, MD  amLODipine (NORVASC) 10 MG tablet Take 10 mg by mouth daily.    Historical Provider, MD  chlorhexidine (PERIDEX)  0.12 % solution Use as directed 15 mLs in the mouth or throat 2 (two) times daily.    Historical Provider, MD  ciprofloxacin (CILOXAN) 0.3 % ophthalmic solution Place 2 drops into the right eye 4 (four) times daily. Administer 1 drop, every 2 hours, while awake, for 2 days. Then 1 drop, every 4 hours, while awake, for the next 5 days. 09/17/13   Simonne Martinet, NP  cloNIDine (CATAPRES) 0.1 MG tablet Take 0.1 mg by mouth every 6 (six) hours as needed (for hypertension for systolic blood pressure greater than 160).    Historical Provider, MD  darbepoetin (ARANESP) 40 MCG/0.4ML SOLN injection Inject 40 mcg into the skin every 7 (seven) days. On Monday    Historical Provider, MD  docusate sodium 100 MG CAPS Take 100 mg by mouth 2 (two) times daily. 09/17/13   Simonne Martinet, NP  DULoxetine (CYMBALTA) 30 MG capsule Take 30 mg by mouth daily.    Historical Provider, MD  ferrous sulfate 325 (65 FE) MG tablet Take 325 mg by mouth 2 (two) times daily.    Historical Provider, MD  hydrALAZINE (APRESOLINE) 100 MG tablet Take 100 mg by mouth 3 (three) times daily.    Historical Provider, MD  imipenem-cilastatin 250 mg in sodium chloride 0.9 % 100 mL Inject 250 mg into the vein every 12 (twelve) hours. 09/17/13   Simonne Martinet, NP  labetalol (NORMODYNE) 300 MG tablet Take 300 mg by mouth 3 (three) times daily.    Historical Provider, MD  levETIRAcetam (KEPPRA) 250 MG tablet Take 250 mg by mouth at bedtime.    Historical Provider, MD  levETIRAcetam (KEPPRA) 500 MG tablet Take 500 mg by mouth daily.    Historical Provider, MD  Multiple Vitamins-Minerals (MULTIVITAMIN & MINERAL PO) Take 1 tablet by mouth daily.    Historical Provider, MD  mupirocin ointment (BACTROBAN) 2 % Place 1 application into the nose as needed (for right toe infection).    Historical Provider, MD  omeprazole (PRILOSEC) 20 MG capsule Take 20 mg by mouth daily.    Historical Provider, MD  promethazine (PHENERGAN) 25 MG tablet Take 12.5 mg by  mouth every 6 (six) hours as needed for nausea or vomiting.    Historical Provider, MD  simethicone (MYLICON) 80 MG chewable tablet Chew 80 mg by mouth every 6 (six) hours as needed for flatulence.    Historical Provider, MD  sodium bicarbonate 650 MG tablet Take 2 tablets (1,300 mg total) by mouth 2 (two) times daily. 09/17/13   Simonne Martinet, NP  sodium chloride 0.9 % infusion Inject 250 mLs into the vein as needed (if IV carrier fluid needed.). 09/17/13   Simonne Martinet, NP  tiZANidine (ZANAFLEX) 2 MG tablet Take 2 mg by mouth 3 (three) times daily.    Historical Provider, MD   Allergies  Allergen Reactions  . Neurontin [Gabapentin] Other (See Comments)    Per MAR, unknown  . Xanax [Alprazolam] Other (See Comments)    Per MAR, unknown    FAMILY HISTORY:  has no family status information on file.  SOCIAL HISTORY:  reports that she has never smoked. She does not have any smokeless tobacco history on file. She reports that she does not drink alcohol or use illicit drugs.  REVIEW OF SYSTEMS:  Unable  SUBJECTIVE:  Appears comfortable on vent  VITAL SIGNS: Pulse Rate:  [93] 93 (10/05 1412) Resp:  [20] 20 (10/05 1412) SpO2:  [100 %] 100 % (10/05 1412) FiO2 (%):  [40 %] 40 % (10/05 1409) HEMODYNAMICS:   VENTILATOR SETTINGS: Vent Mode:  [-] PRVC FiO2 (%):  [40 %] 40 % Set Rate:  [20 bmp] 20 bmp Vt Set:  [500 mL] 500 mL PEEP:  [5 cmH20] 5 cmH20 Plateau Pressure:  [32 cmH20] 32 cmH20 INTAKE / OUTPUT: No intake or output data in the 24 hours ending 11/09/13 1434  PHYSICAL EXAMINATION: General:  Chronically ill appearing AAF. Currently on full vent support  Neuro:  Awake, follows commands. Generalized weakness. Left sided hemiparesis w/ right sided UE contractions and very little LE strength.  HEENT:  Right eye crusted and erythremic  Cardiovascular:  rrr Lungs:  Scattered rhonchi + right sided pleural rub  Abdomen:  Soft, non-tender + bowel sounds  Musculoskeletal:  L sided  hemiparesis  Skin:  Generalized anasarca, stage 2 sacral decub   LABS:  CBC No results found for this basename: WBC, HGB, HCT, PLT,  in the last 168 hours Coag's No results found for this basename: APTT, INR,  in the last 168 hours BMET No results found for this basename: NA, K, CL, CO2, BUN, CREATININE, GLUCOSE,  in the last 168 hours Electrolytes No results found for this basename: CALCIUM, MG, PHOS,  in the last 168 hours Sepsis Markers No results found for this basename: LATICACIDVEN, PROCALCITON, O2SATVEN,  in the last 168 hours ABG No results found for this basename: PHART, PCO2ART, PO2ART,  in the last 168 hours Liver Enzymes No results found for this basename: AST, ALT, ALKPHOS, BILITOT, ALBUMIN,  in the last 168 hours Cardiac Enzymes No results found for this basename: TROPONINI, PROBNP,  in the last 168 hours Glucose No results found for this basename: GLUCAP,  in the last 168 hours  Imaging No results found.   ASSESSMENT / PLAN:  PULMONARY OETT trach (Chronic)  A: Chronic respiratory failure Tracheostomy status Bilateral pleural effusions. Pleur-x tubes (placed at [facility] since last d/c)  P:   Vent stabilization protocol  Get ABG, reviewed, keep same MV for now PAD protocol  PCXR now assess pre op volume status Will disconnect pleur-x tubes. F/u CXR in am.    CARDIOVASCULAR CVL RUE PICC A:  HTN  CAD, h/o cardiomyopathy  P:  Cont home meds  tele  RENAL A:  Acute kidney injury in setting of obstructive uropathy s/p bilateral percutaneous nephrostomy tubes (placed 8/11 and 8/12)  P:   Admitted by urology possible for f/u and possible nephrolithotomy +/- suprapubic tube Ck blood chemistry  pcxr for volume status  GASTROINTESTINAL A:   Protein calorie malnutrition  P:   Nutritional consult Ck pre-albumin, may be realiable out of aute illness now for a month  HEMATOLOGIC A:   Anemia of chronic disease, also has h/o positive stool guaiac   hgb was 7.4 on d/c in august. Did get 1 unit of blood during that stay  P:  Continue darbopoietin, re assess ferritin however No heparin for now until we are sure there is GI bleed, monitor Hb  Transfused for hgb <7.0   INFECTIOUS A:   Right eye conjunctivitis Recent UTI (treated w/ primaxin for psuedomonas and providcia stuartii in Aug 20015) Recent HCAP (NOS) Sacral decub  P:   BCx2 10/5>>> UC 10/5>>> Sputum 10/5>>> Left pleural fluid 10/5>>> Right pleural fluid 10/5>>> primaxin  10/5>>> Will start primaxin per Urology request.. They wanted ABX pre-op and most recent org was pan-resistant  Consult wound  Isolation May favor vancomycin pre op and peri also given risk of VRE , Staph as well  ENDOCRINE A:  No acute   P:   Ck chemistry  If am glucose > 150 will start ssi and scheduled accuchecks   NEUROLOGIC A:   Neuromyelitis Optica Left sided hemiparesis blind  P:   RASS goal: 0 Supportive care   Family updated:  Contact person: Hardacre,Anita Sister [phone]   Interdisciplinary Family Meeting v Palliative Care Meeting:    TODAY'S SUMMARY:  Complicated patient Admitted by Urology from [facility] for possible for f/u and possible nephrolithotomy +/- suprapubic tube. She is chronically vent dependant and we treated her back in August for UT sepsis, acute renal failure and obstructive uropathy which was when her nephrostomy tubes were placed. She is a chronically ill patient. Since d/c last she has also had two bilateral pleur-x drains placed. Presume that these were due to persistent/ recurrent effusions in setting of her poor nutritional status/ low protein stores. She arrived with both tubes still attached to the drainage systems and it is unclear when they were drained last. Her last cultures from urine had MDR pseudomonas and providcia. She should have completed abx but unclear how many times she has been treated since. Will go ahead and pan-culture ALL tubes and  drains. Start empiric primaxin at the request of surgery as they are concerned about bacterial translocation during nephrostolithotomy, and continue supportive care.    I have personally obtained a history, examined the patient, evaluated laboratory and imaging results, formulated the assessment and plan and placed orders.   Mcarthur Rossetti. Tyson Alias, MD, FACP Pgr: [phone] [place]  Pulmonary and Critical Care Medicine [facility] Pager: [phone]  11/09/2013, 2:34 PM

## 2013-11-09 NOTE — H&P (Signed)
I have reviewed the history and exam and will proceed with the procedure as planned tomorrow.

## 2013-11-10 ENCOUNTER — Other Ambulatory Visit: Payer: Self-pay

## 2013-11-10 ENCOUNTER — Encounter (HOSPITAL_COMMUNITY): Payer: Medicare Other | Admitting: Certified Registered"

## 2013-11-10 ENCOUNTER — Encounter (HOSPITAL_COMMUNITY)
Admission: RE | Disposition: A | Discharge status: Skilled Nursing Facility | Payer: Self-pay | Source: Ambulatory Visit | Attending: Pulmonary Disease

## 2013-11-10 ENCOUNTER — Inpatient Hospital Stay (HOSPITAL_COMMUNITY): Payer: Medicare Other

## 2013-11-10 ENCOUNTER — Inpatient Hospital Stay (HOSPITAL_COMMUNITY): Payer: Medicare Other | Admitting: Certified Registered"

## 2013-11-10 ENCOUNTER — Encounter (HOSPITAL_COMMUNITY): Payer: Self-pay | Admitting: Certified Registered"

## 2013-11-10 DIAGNOSIS — I10 Essential (primary) hypertension: Secondary | ICD-10-CM

## 2013-11-10 DIAGNOSIS — Z930 Tracheostomy status: Secondary | ICD-10-CM

## 2013-11-10 DIAGNOSIS — H109 Unspecified conjunctivitis: Secondary | ICD-10-CM

## 2013-11-10 DIAGNOSIS — J189 Pneumonia, unspecified organism: Secondary | ICD-10-CM

## 2013-11-10 DIAGNOSIS — J9610 Chronic respiratory failure, unspecified whether with hypoxia or hypercapnia: Secondary | ICD-10-CM

## 2013-11-10 DIAGNOSIS — E46 Unspecified protein-calorie malnutrition: Secondary | ICD-10-CM

## 2013-11-10 DIAGNOSIS — N184 Chronic kidney disease, stage 4 (severe): Secondary | ICD-10-CM

## 2013-11-10 DIAGNOSIS — J90 Pleural effusion, not elsewhere classified: Secondary | ICD-10-CM

## 2013-11-10 DIAGNOSIS — D638 Anemia in other chronic diseases classified elsewhere: Secondary | ICD-10-CM

## 2013-11-10 HISTORY — PX: INSERTION OF SUPRAPUBIC CATHETER: SHX5870

## 2013-11-10 HISTORY — PX: NEPHROLITHOTOMY: SHX5134

## 2013-11-10 LAB — BASIC METABOLIC PANEL
ANION GAP: 12 (ref 5–15)
BUN: 51 mg/dL — ABNORMAL HIGH (ref 6–23)
CO2: 20 meq/L (ref 19–32)
Calcium: 7.9 mg/dL — ABNORMAL LOW (ref 8.4–10.5)
Chloride: 99 mEq/L (ref 96–112)
Creatinine, Ser: 2.49 mg/dL — ABNORMAL HIGH (ref 0.50–1.10)
GFR calc Af Amer: 22 mL/min — ABNORMAL LOW (ref >90)
GFR, EST NON AFRICAN AMERICAN: 19 mL/min — AB (ref >90)
Glucose, Bld: 92 mg/dL (ref 70–99)
Potassium: 4.2 mEq/L (ref 3.7–5.3)
Sodium: 131 mEq/L — ABNORMAL LOW (ref 137–147)

## 2013-11-10 LAB — GLUCOSE, CAPILLARY: Glucose-Capillary: 94 mg/dL (ref 70–99)

## 2013-11-10 LAB — PREALBUMIN: Prealbumin: 9.5 mg/dL — ABNORMAL LOW (ref 17.0–34.0)

## 2013-11-10 LAB — CBC
HCT: 23.9 % — ABNORMAL LOW (ref 36.0–46.0)
Hemoglobin: 7.8 g/dL — ABNORMAL LOW (ref 12.0–15.0)
MCH: 29.7 pg (ref 26.0–34.0)
MCHC: 32.6 g/dL (ref 30.0–36.0)
MCV: 90.9 fL (ref 78.0–100.0)
Platelets: 166 10*3/uL (ref 150–400)
RBC: 2.63 MIL/uL — AB (ref 3.87–5.11)
RDW: 16.6 % — ABNORMAL HIGH (ref 11.5–15.5)
WBC: 14.6 10*3/uL — ABNORMAL HIGH (ref 4.0–10.5)

## 2013-11-10 LAB — FIBRINOGEN: Fibrinogen: 479 mg/dL — ABNORMAL HIGH (ref 204–475)

## 2013-11-10 LAB — HEMOGLOBIN AND HEMATOCRIT, BLOOD
HEMATOCRIT: 31.6 % — AB (ref 36.0–46.0)
HEMOGLOBIN: 10.6 g/dL — AB (ref 12.0–15.0)

## 2013-11-10 SURGERY — NEPHROLITHOTOMY PERCUTANEOUS
Anesthesia: General

## 2013-11-10 MED ORDER — HYDRALAZINE HCL 50 MG PO TABS
100.0000 mg | ORAL_TABLET | Freq: Four times a day (QID) | ORAL | Status: DC
  Administered 2013-11-10 – 2013-11-19 (×35): 100 mg via ORAL
  Filled 2013-11-10 (×35): qty 2

## 2013-11-10 MED ORDER — FENTANYL CITRATE 0.05 MG/ML IJ SOLN
INTRAMUSCULAR | Status: AC
Start: 2013-11-10 — End: 2013-11-10
  Filled 2013-11-10: qty 2

## 2013-11-10 MED ORDER — 0.9 % SODIUM CHLORIDE (POUR BTL) OPTIME
TOPICAL | Status: DC | PRN
  Administered 2013-11-10: 1000 mL

## 2013-11-10 MED ORDER — MIDAZOLAM HCL 2 MG/2ML IJ SOLN
INTRAMUSCULAR | Status: AC
  Filled 2013-11-10: qty 2

## 2013-11-10 MED ORDER — LEVETIRACETAM IN NACL 500 MG/100ML IV SOLN
500.0000 mg | Freq: Two times a day (BID) | INTRAVENOUS | Status: DC
  Administered 2013-11-10 – 2013-11-11 (×3): 500 mg via INTRAVENOUS
  Filled 2013-11-10 (×4): qty 100

## 2013-11-10 MED ORDER — SODIUM CHLORIDE 0.9 % IR SOLN
Status: DC | PRN
  Administered 2013-11-10: 5000 mL via INTRAVESICAL

## 2013-11-10 MED ORDER — FENTANYL CITRATE 0.05 MG/ML IJ SOLN
INTRAMUSCULAR | Status: AC
  Filled 2013-11-10: qty 2

## 2013-11-10 MED ORDER — PROPOFOL 10 MG/ML IV BOLUS
INTRAVENOUS | Status: AC
  Filled 2013-11-10: qty 20

## 2013-11-10 MED ORDER — NICARDIPINE HCL IN NACL 40-0.83 MG/200ML-% IV SOLN
3.0000 mg/h | INTRAVENOUS | Status: DC
  Administered 2013-11-10 (×2): 10 mg/h via INTRAVENOUS
  Filled 2013-11-10 (×2): qty 200

## 2013-11-10 MED ORDER — FENTANYL CITRATE 0.05 MG/ML IJ SOLN
INTRAMUSCULAR | Status: DC | PRN
  Administered 2013-11-10 (×2): 50 ug via INTRAVENOUS

## 2013-11-10 MED ORDER — SODIUM CHLORIDE 0.9 % IV SOLN: Freq: Once | INTRAVENOUS | Status: DC

## 2013-11-10 MED ORDER — IOHEXOL 300 MG/ML  SOLN
INTRAMUSCULAR | Status: DC | PRN
  Administered 2013-11-10: 30 mL via INTRAVENOUS

## 2013-11-10 MED ORDER — LACTATED RINGERS IV SOLN
INTRAVENOUS | Status: DC | PRN
  Administered 2013-11-10: 08:00:00 via INTRAVENOUS

## 2013-11-10 MED ORDER — ROCURONIUM BROMIDE 100 MG/10ML IV SOLN
INTRAVENOUS | Status: AC
Start: 2013-11-10 — End: 2013-11-10
  Filled 2013-11-10: qty 1

## 2013-11-10 MED ORDER — ENSURE PUDDING PO PUDG
1.0000 | Freq: Three times a day (TID) | ORAL | Status: DC
  Administered 2013-11-11 – 2013-11-18 (×18): 1 via ORAL
  Filled 2013-11-10 (×30): qty 1

## 2013-11-10 MED FILL — Lorazepam Inj 2 MG/ML: INTRAMUSCULAR | Qty: 1 | Status: AC

## 2013-11-10 MED FILL — Fentanyl Citrate Inj 0.05 MG/ML: INTRAMUSCULAR | Qty: 2 | Status: AC

## 2013-11-10 SURGICAL SUPPLY — 62 items
BAG URINE DRAINAGE (UROLOGICAL SUPPLIES) ×4 IMPLANT
BAG URINE LEG 500ML (DRAIN) ×4 IMPLANT
BASKET STONE NCOMPASS (UROLOGICAL SUPPLIES) ×4 IMPLANT
BASKET ZERO TIP NITINOL 2.4FR (BASKET) ×4 IMPLANT
BENZOIN TINCTURE PRP APPL 2/3 (GAUZE/BANDAGES/DRESSINGS) ×16 IMPLANT
BLADE SURG 15 STRL LF DISP TIS (BLADE) ×2 IMPLANT
BLADE SURG 15 STRL SS (BLADE) ×2
CARTRIDGE STONEBREAK CO2 KIDNE (ELECTROSURGICAL) IMPLANT
CATH AINSWORTH 30CC 24FR (CATHETERS) ×4 IMPLANT
CATH FOLEY 2WAY SLVR  5CC 20FR (CATHETERS) ×2
CATH FOLEY 2WAY SLVR 5CC 20FR (CATHETERS) ×2 IMPLANT
CATH KUMPE (CATHETERS) ×4
CATH ROBINSON RED A/P 20FR (CATHETERS) IMPLANT
CATH SLIP 5FR 0.38 X 40 KMP (CATHETERS) ×4 IMPLANT
CATH URET 5FR 28IN OPEN ENDED (CATHETERS) IMPLANT
CATH URET DUAL LUMEN 6-10FR 50 (CATHETERS) ×8 IMPLANT
CATH X-FORCE N30 NEPHROSTOMY (TUBING) ×4 IMPLANT
COVER SURGICAL LIGHT HANDLE (MISCELLANEOUS) ×8 IMPLANT
DRAPE C-ARM 42X120 X-RAY (DRAPES) ×4 IMPLANT
DRAPE CAMERA CLOSED 9X96 (DRAPES) ×4 IMPLANT
DRAPE LINGEMAN PERC (DRAPES) ×4 IMPLANT
DRAPE SURG IRRIG POUCH 19X23 (DRAPES) ×4 IMPLANT
DRSG PAD ABDOMINAL 8X10 ST (GAUZE/BANDAGES/DRESSINGS) ×4 IMPLANT
DRSG TEGADERM 8X12 (GAUZE/BANDAGES/DRESSINGS) ×4 IMPLANT
ELECT REM PT RETURN 9FT ADLT (ELECTROSURGICAL) ×4
ELECTRODE REM PT RTRN 9FT ADLT (ELECTROSURGICAL) ×2 IMPLANT
FIBER LASER FLEXIVA 550 (UROLOGICAL SUPPLIES) IMPLANT
GAUZE SPONGE 4X4 12PLY STRL (GAUZE/BANDAGES/DRESSINGS) ×4 IMPLANT
GLOVE SURG SS PI 8.0 STRL IVOR (GLOVE) IMPLANT
GOWN STRL REUS W/TWL XL LVL3 (GOWN DISPOSABLE) ×8 IMPLANT
GUIDEWIRE AMPLATZ STIFF 0.35 (WIRE) ×4 IMPLANT
GUIDEWIRE STR DUAL SENSOR (WIRE) ×4 IMPLANT
KIT BASIN OR (CUSTOM PROCEDURE TRAY) ×4 IMPLANT
KIT SUPRAPUBIC CATH (MISCELLANEOUS) ×4 IMPLANT
MANIFOLD NEPTUNE II (INSTRUMENTS) ×4 IMPLANT
MASK EYE SHIELD (GAUZE/BANDAGES/DRESSINGS) ×4 IMPLANT
NEEDLE HYPO 22GX1.5 SAFETY (NEEDLE) IMPLANT
NS IRRIG 1000ML POUR BTL (IV SOLUTION) ×4 IMPLANT
PACK BASIC VI WITH GOWN DISP (CUSTOM PROCEDURE TRAY) ×4 IMPLANT
PACK CYSTO (CUSTOM PROCEDURE TRAY) ×4 IMPLANT
PENCIL BUTTON HOLSTER BLD 10FT (ELECTRODE) IMPLANT
PLUG CATH AND CAP STER (CATHETERS) IMPLANT
PROBE KIDNEY STONEBRKR 2.0X425 (ELECTROSURGICAL) IMPLANT
PROBE LITHOCLAST ULTRA 3.8X403 (UROLOGICAL SUPPLIES) ×4 IMPLANT
PROBE PNEUMATIC 1.0MMX570MM (UROLOGICAL SUPPLIES) ×4 IMPLANT
SET IRRIG Y TYPE TUR BLADDER L (SET/KITS/TRAYS/PACK) ×4 IMPLANT
SHEATH ACCESS URETERAL 24CM (SHEATH) ×4 IMPLANT
SPONGE LAP 4X18 X RAY DECT (DISPOSABLE) ×4 IMPLANT
STONE CATCHER W/TUBE ADAPTER (UROLOGICAL SUPPLIES) ×4 IMPLANT
SUT ETHILON 2 0 PS N (SUTURE) ×4 IMPLANT
SUT ETHILON 3 0 FSL (SUTURE) ×4 IMPLANT
SUT ETHILON 3 0 PS 1 (SUTURE) ×4 IMPLANT
SUT SILK 2 0 30  PSL (SUTURE) ×2
SUT SILK 2 0 30 PSL (SUTURE) ×2 IMPLANT
SYR 20CC LL (SYRINGE) ×8 IMPLANT
SYRINGE 10CC LL (SYRINGE) ×4 IMPLANT
TOWEL OR 17X26 10 PK STRL BLUE (TOWEL DISPOSABLE) ×4 IMPLANT
TOWEL OR NON WOVEN STRL DISP B (DISPOSABLE) ×4 IMPLANT
TRAY FOLEY CATH 16FRSI W/METER (SET/KITS/TRAYS/PACK) ×4 IMPLANT
TUBING CONNECTING 10 (TUBING) ×9 IMPLANT
TUBING CONNECTING 10' (TUBING) ×3
WATER STERILE IRR 3000ML UROMA (IV SOLUTION) ×4 IMPLANT

## 2013-11-10 NOTE — Brief Op Note (Signed)
11/09/2013 - 11/10/2013  11:02 AM  PATIENT:  [name]  63 y.o. female  PRE-OPERATIVE DIAGNOSIS:  LEFT RENAL/URETERAL STONE >2cm, NGB  POST-OPERATIVE DIAGNOSIS:  LEFT RENAL/URETERAL STONE >2cm, NGB  PROCEDURE:  Procedure(s): LEFT NEPHROLITHOTOMY PERCUTANEOUS (Left) INSERTION OF SUPRAPUBIC CATHETER (N/A)  SURGEON:  Surgeon(s) and Role:    * Anner Crete, MD - Primary  PHYSICIAN ASSISTANT:   ASSISTANTS: Dr. Harlene Salts   ANESTHESIA:   general  EBL:  Total I/O In: 1170 [I.V.:500; Blood:670] Out: 225 [Urine:175; Blood:50]  BLOOD ADMINISTERED:none  DRAINS: Urinary Catheter (Suprapubic) and 25fr [place] left nephrostomy, 6 fr left [place].    LOCAL MEDICATIONS USED:  NONE  SPECIMEN:  Source of Specimen:  left renal stone  DISPOSITION OF SPECIMEN:  PATHOLOGY  COUNTS:  YES  TOURNIQUET:  * No tourniquets in log *  DICTATION: .Other Dictation: Dictation Number [id number]  PLAN OF CARE: Admit to inpatient   PATIENT DISPOSITION:  ICU - intubated and critically ill.   Delay start of Pharmacological VTE agent (>24hrs) due to surgical blood loss or risk of bleeding: yes

## 2013-11-10 NOTE — Anesthesia Postprocedure Evaluation (Signed)
  Anesthesia Post-op Note  Patient: [name]  Procedure(s) Performed: Procedure(s) (LRB): LEFT NEPHROLITHOTOMY PERCUTANEOUS (Left) INSERTION OF SUPRAPUBIC CATHETER (N/A)  Patient Location: ICU  Anesthesia Type: General  Level of Consciousness: awake and alert   Airway and Oxygen Therapy: Patient Spontanous Breathing  Post-op Pain: mild  Post-op Assessment: Post-op Vital signs reviewed, Patient's Cardiovascular Status Stable, Respiratory Function Stable, Patent Airway and No signs of Nausea or vomiting  Last Vitals:  Filed Vitals:   11/10/13 1530  BP: 171/80  Pulse: 76  Temp:   Resp: 20    Post-op Vital Signs: stable   Complications: No apparent anesthesia complications

## 2013-11-10 NOTE — Progress Notes (Signed)
No 08:00 vent ck done due to pt in OR. Pt did not get back tell 11:35.

## 2013-11-10 NOTE — Progress Notes (Signed)
INITIAL NUTRITION ASSESSMENT  DOCUMENTATION CODES Per approved criteria  -Not Applicable   INTERVENTION: - Ensure pudding TID - Notified kitchen staff of pt's food preferences  - RD to continue to monitor   NUTRITION DIAGNOSIS: Inadequate oral intake related to poor appetite as evidenced by <25% meal intake.    Goal: Pt to consume >90% of meals/supplements  Monitor:  Weights, labs, intake  Reason for Assessment: Ventilated pt   63 y.o. 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 She lives in [place] for the past 4 years.   - S/p left nephrolithotomy this morning - Met with pt who reports eating 3 meals/day PTA with good appetite  - Said she would drink 1 Ensure/day - Per telephone conversation with [facility] Staff, pt was on a no added salt/mechanical soft diet with thin liquids and was a very picky eater - enjoys soups  - PO intake since admission has been 10% of her meals  Alk phos elevated PALB low BUN/Cr elevated with low GFR   Height: Ht Readings from Last 1 Encounters:  11/09/13 $RemoveB'5\' 1"'coNFIcqj$  (1.549 m)    Weight: Wt Readings from Last 1 Encounters:  11/10/13 139 lb 12.4 oz (63.4 kg)    Ideal Body Weight: 105 lbs   % Ideal Body Weight: 132%  Wt Readings from Last 10 Encounters:  11/10/13 139 lb 12.4 oz (63.4 kg)  11/10/13 139 lb 12.4 oz (63.4 kg)  09/17/13 134 lb 7.7 oz (61 kg)    Usual Body Weight: 134 lbs 2 months ago   % Usual Body Weight: 104%  BMI:  Body mass index is 26.42 kg/(m^2).  Estimated Nutritional Needs: Kcal: 1300-1500 Protein: 60-80g Fluid: 1.3-1.5L/day   Skin: Stage II coccyx pressure ulcer   Diet Order: Renal with 1.2L fluid restriction  EDUCATION NEEDS: -No education needs identified at this  time   Intake/Output Summary (Last 24 hours) at 11/10/13 1515 Last data filed at 11/10/13 1500  Gross per 24 hour  Intake 3271.33 ml  Output   1040 ml  Net 2231.33 ml    Last BM: PTA  Labs:   Recent Labs Lab 11/09/13 1506 11/10/13 0500  NA 131* 131*  K 4.2 4.2  CL 99 99  CO2 20 20  BUN 50* 51*  CREATININE 2.58* 2.49*  CALCIUM 8.0* 7.9*  MG 2.0  --   PHOS 4.9*  --   GLUCOSE 98 92    CBG (last 3)   Recent Labs  11/09/13 1211  GLUCAP 94    Scheduled Meds: . antiseptic oral rinse  7 mL Mouth Rinse QID  . calcium acetate  1,334 mg Oral TID WC  . carbamazepine  100 mg Oral BID  . chlorhexidine  15 mL Mouth Rinse BID  . ciprofloxacin  2 drop Right Eye QID  . cloNIDine  0.3 mg Oral TID  . [START ON 11/14/2013] darbepoetin (ARANESP) injection - NON-DIALYSIS  40 mcg Subcutaneous Q Sat-1800  . docusate  100 mg Per Tube BID  . doxazosin  4 mg Oral Q12H  . famotidine  20 mg Oral Daily  . feeding supplement (PRO-STAT SUGAR FREE 64)  30 mL Oral QPC supper  . ferrous sulfate  325 mg Oral BID WC  . hydrALAZINE  100 mg Oral 4 times per day  . imipenem-cilastatin  250 mg Intravenous Q12H  . isosorbide dinitrate  20 mg Oral TID  . labetalol  600 mg Oral BID  . levETIRAcetam  500 mg Intravenous Q12H  . multivitamin with minerals  1 tablet Oral Daily  . pantoprazole sodium  40 mg Per Tube Daily  . pregabalin  100 mg Oral Daily  . tiZANidine  2 mg Oral TID  . vancomycin  500 mg Intravenous Q24H    Continuous Infusions: . dextrose 5 % and 0.45% NaCl 1,000 mL (11/10/13 1342)  . niCARDipine 7.5 mg/hr (11/10/13 1506)    Past Medical History  Diagnosis Date  . Neuromyelitis optica   . Blindness   . Hemiparesis     left sided  . Hypertension   . Cardiomyopathy   . Coronary artery disease   . Ventilator dependent   . Respiratory failure   . Depression   . Anemia   . Seizures     Past Surgical History  Procedure Laterality Date  . Tracheostomy      Carlis Stable MS, [place], [place] Pager [phone] Weekend/After Hours Pager

## 2013-11-10 NOTE — Progress Notes (Addendum)
CRITICAL VALUE ALERT  Critical value received:  Positive right pleural cultures: rare white blood cells mononuclear, abundant gram negative rods, few gram positive pairs and clusters  Date of notification:  11/10/2013  Time of notification:  1300  Critical value read back:Yes.    Nurse who received alert:  Santiago Glad RN  MD notified (1st page):  Anders Simmonds, NP  Time of first page:  Spoke in person  Responding MD:  Anders Simmonds, NP  Time MD responded: spoke in person

## 2013-11-10 NOTE — Interval H&P Note (Signed)
History and Physical Interval Note:  Her Hgb is 7.8 and she has been typed and screened.      11/10/2013 7:17 AM  [name]  has presented today for surgery, with the diagnosis of LEFT RENAL/URETERAL STONE  The various methods of treatment have been discussed with the patient and family. After consideration of risks, benefits and other options for treatment, the patient has consented to  Procedure(s): LEFT NEPHROLITHOTOMY PERCUTANEOUS (Left) INSERTION OF SUPRAPUBIC CATHETER (N/A) as a surgical intervention .  The patient's history has been reviewed, patient examined, no change in status, stable for surgery.  I have reviewed the patient's chart and labs.  Questions were answered to the patient's satisfaction.     Gini Caputo J

## 2013-11-10 NOTE — Care Management Note (Signed)
    Page 1 of 2   11/16/2013     11:14:44 AM CARE MANAGEMENT NOTE 11/16/2013  Patient:  [name], [name]   Account Number:  [id number]  Date Initiated:  11/10/2013  Documentation initiated by:  DAVIS,[name]  Subjective/Objective Assessment:   LEFT RENAL/URETERAL STONE     Action/Plan:   home when stable   Anticipated DC Date:  11/19/2013   Anticipated DC Plan:  LONG TERM ACUTE CARE (LTAC)  In-house referral  Clinical Social Worker      DC Planning Services  CM consult      [facility] Choice  NA   Choice offered to / List presented to:  NA   DME arranged  NA      DME agency  NA     HH arranged  NA      HH agency  NA   Status of service:  In process, will continue to follow Medicare Important Message given?   (If response is "NO", the following Medicare IM given date fields will be blank) Date Medicare IM given:   Medicare IM given by:   Date Additional Medicare IM given:   Additional Medicare IM given by:    Discharge Disposition:    Per UR Regulation:  Reviewed for med. necessity/level of care/duration of stay  If discussed at Long Length of Stay Meetings, dates discussed:    Comments:  [id number]/[name] Earlene Plater, RN, BSN, CCM: 10/6  s/p perc left nephrolithotomy and placement of SP cath, tx'd unit of blood in OR 10/7: GNRs growing from Pleur-x tubes bilaterally, right also had few GPCs. Placed both Pleur-x tubes to continuous drainage via sahara. 10/8: no new issues. Awaiting final clture data. Both CTs flushed as minimal out-put. 10/9: both chest tubes removed 10/11: ID consulted for MDR Klebsiella (carbapenase positive)/ Providencia  And pseudomonas(still sensitive to imipenem). Also Pleural fluid w/ Klebsiella w/ ESBL produces and Providencia. Started on AVYCAZ and Flagyl per ID.    [id number]/[name] Earlene Plater, RN, BSN, CCM Chart reviewed. Discharge needs and patient's stay to be reviewed and followed by case manager. patient continues to have bloody urine and  elevated wbc count/poss return to or on Tuesday Oct.13,2015 for exploration of ureter due to retained stone fragments/patient continued on vent with Trach/patient resides at [facility] for her care.  [id number]/[name] Earlene Plater, RN, BSN, CCM Chart reviewed. Discharge needs and patient's stay to be reviewed and followed by case manager.

## 2013-11-10 NOTE — Progress Notes (Signed)
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 She lives in [facility] for the past 4 years. She was discharged by our service in Aug 2015 after an acute hospitalization for acute renal failure in setting of obstructive uropathy and urinary tract sepsis. During this time she required bilateral urostomy tubes placed 8/11 and 8/12. She presents 10/5 being brought in by urology for follow up of her obstructive uropathy, left percutaneous nephrolithotomy and possible suprapubic tube. PCCM asked to assist w/ supportive medical care.   STUDIES:    SIGNIFICANT EVENTS: 10/6: s/p perc left nephrolithotomy and placement of SP cath      SUBJECTIVE:  Appears comfortable on vent  VITAL SIGNS: Temp:  [98.7 F (37.1 C)-99.1 F (37.3 C)] 99.1 F (37.3 C) (10/06 0400) Pulse Rate:  [71-93] 78 (10/06 0700) Resp:  [0-25] 20 (10/06 0700) BP: (139-215)/(53-93) 179/68 mmHg (10/06 0700) SpO2:  [99 %-100 %] 100 % (10/06 0700) FiO2 (%):  [30 %-40 %] 30 % (10/06 1138) Weight:  [63.4 kg (139 lb 12.4 oz)-66.1 kg (145 lb 11.6 oz)] 63.4 kg (139 lb 12.4 oz) (10/06 0400) HEMODYNAMICS:   VENTILATOR SETTINGS: Vent Mode:  [-] PRVC FiO2 (%):  [30 %-40 %] 30 % Set Rate:  [20 bmp] 20 bmp Vt Set:  [500 mL] 500 mL PEEP:  [5 cmH20] 5 cmH20 Plateau Pressure:  [31 cmH20-35 cmH20] 31 cmH20 INTAKE / OUTPUT:  Intake/Output Summary (Last 24 hours) at 11/10/13 1156 Last data filed at 11/10/13 1101  Gross per 24 hour  Intake 2710.83 ml  Output   1050 ml  Net 1660.83 ml    PHYSICAL  EXAMINATION: General:  Chronically ill appearing AAF. Currently on full vent support  Neuro:  Awake, follows commands. Generalized weakness. Left sided hemiparesis w/ right sided UE contractions and very little LE strength.  HEENT:  Right eye crusted and erythremic  Cardiovascular:  rrr Lungs:  Scattered rhonchi + right sided pleural rub no change  Abdomen:  Soft, non-tender + bowel sounds  Musculoskeletal:  L sided hemiparesis  Skin:  Generalized anasarca, stage 2 sacral decub   LABS:  CBC  Recent Labs Lab 11/09/13 1506 11/10/13 0500  WBC 15.1* 14.6*  HGB 8.3* 7.8*  HCT 25.2* 23.9*  PLT 188 166   Coag's  Recent Labs Lab 11/09/13 1506  APTT 38*  INR 1.26   BMET  Recent Labs Lab 11/09/13 1506 11/10/13 0500  NA 131* 131*  K 4.2 4.2  CL 99 99  CO2 20 20  BUN 50* 51*  CREATININE 2.58* 2.49*  GLUCOSE 98 92   Electrolytes  Recent Labs Lab 11/09/13 1506 11/10/13 0500  CALCIUM 8.0* 7.9*  MG 2.0  --   PHOS 4.9*  --    Sepsis Markers  Recent Labs Lab 11/09/13 1700  PROCALCITON 1.35   ABG  Recent Labs Lab 11/09/13 1538  PHART 7.396  PCO2ART 32.6*  PO2ART 123.0*   Liver Enzymes  Recent Labs Lab 11/09/13 1506  AST 10  ALT 8  ALKPHOS 140*  BILITOT 0.3  ALBUMIN 1.6*  Cardiac Enzymes No results found for this basename: TROPONINI, PROBNP,  in the last 168 hours Glucose  Recent Labs Lab 11/09/13 1211  GLUCAP 94    Imaging No results found.   ASSESSMENT / PLAN:  PULMONARY OETT trach (Chronic)  A: Chronic respiratory failure Tracheostomy status Bilateral pleural effusions uncertain etiology, consider urothorax. Pleur-x tubes (placed at [facility] since last d/c) Basilar R>L atx  P:   Vent stabilization protocol  PAD protocol  Drain  pleur-x tubes q 72hrs .  F/u CXR in am.    CARDIOVASCULAR CVL RUE PICC A:  HTN  CAD, h/o cardiomyopathy  P:  Cont home meds  Tele Wean nicardipine gtt to off  Increase hydralazine  dose  RENAL A:  Acute kidney injury in setting of obstructive uropathy s/p bilateral percutaneous nephrostomy tubes (placed 8/11 and 8/12) Now s/p left perc nephrolithotomy w/ large 2cm stone removed & insertion of suprapubic cath  Scr improved.  P:   Trend chemistry  Post op wound care per urology  Will need another re-evaluation per urology   GASTROINTESTINAL A:   Protein calorie malnutrition  P:   Nutritional consult Renal diet   HEMATOLOGIC A:   Anemia of chronic disease, also has h/o positive stool guaiac  hgb was 7.4 on d/c in august. Did get 1 unit of blood during that stay  P:  Continue darbopoietin, re assess ferritin however No heparin for now, add PAS Transfused for hgb <7.0   INFECTIOUS A:   Right eye conjunctivitis Recent UTI (treated w/ primaxin for psuedomonas and providcia stuartii in Aug 20015) Recent HCAP (NOS) Sacral decub  P:   BCx2 10/5>>> UC 10/5>>> Sputum 10/5>>> Left pleural fluid 10/5>>> Right pleural fluid 10/5>>> primaxin 10/5>>> Vanc 10/5 >>> Consult wound  Isolation   ENDOCRINE A:  No acute   P:   Trend am chemistries   NEUROLOGIC A:   Neuromyelitis Optica Left sided hemiparesis blind  P:   RASS goal: 0 Supportive care   Family updated:  Contact person: [place],Anita Sister [phone]   Interdisciplinary Family Meeting v Palliative Care Meeting:    TODAY'S SUMMARY:  Complicated patient Admitted by Urology from [facility] for possible for f/u and possible nephrolithotomy +/- suprapubic tube. She is chronically vent dependant and we treated her back in August for UT sepsis, acute renal failure and obstructive uropathy which was when her nephrostomy tubes were placed. Now s/p left renal/ureteral stone > 2 cm. W/ insertion of new left left nephrostomy tube and suprapubic cath  Attending:  I have personally obtained a history, examined the patient, evaluated laboratory and imaging results, formulated the assessment  and plan and placed orders.  I have seen and examined the patient with nurse practitioner/resident and agree with the note above.   CC time 40 minutes  Heber [place] Perry, MD [place] P[facility]M Pager: [phone] Cell: [phone] If no response, call [phone]    11/10/2013, 11:56 AM

## 2013-11-10 NOTE — Transfer of Care (Signed)
Immediate Anesthesia Transfer of Care Note  Patient: [name]  Procedure(s) Performed: Procedure(s): LEFT NEPHROLITHOTOMY PERCUTANEOUS (Left) INSERTION OF SUPRAPUBIC CATHETER (N/A)  Patient Location: ICU  Anesthesia Type:General  Level of Consciousness: sedated and unresponsive  Airway & Oxygen Therapy: Patient placed on Ventilator (see vital sign flow sheet for setting)  Post-op Assessment: Report given to PACU RN and Post -op Vital signs reviewed and stable  Post vital signs: Reviewed and stable  Complications: No apparent anesthesia complications

## 2013-11-10 NOTE — Anesthesia Preprocedure Evaluation (Addendum)
Anesthesia Evaluation    Airway       Dental   Pulmonary  Ventilator dependence tracheostomy         Cardiovascular hypertension, Pt. on medications + CAD  cardiomyopathy   Neuro/Psych Seizures -,  Blind L hemiparesis    GI/Hepatic   Endo/Other    Renal/GU      Musculoskeletal   Abdominal   Peds  Hematology   Anesthesia Other Findings   Reproductive/Obstetrics                          Anesthesia Physical Anesthesia Plan  ASA: IV  Anesthesia Plan: General   Post-op Pain Management:    Induction: Intravenous  Airway Management Planned: Tracheostomy  Additional Equipment:   Intra-op Plan:   Post-operative Plan:   Informed Consent: I have reviewed the patients History and Physical, chart, labs and discussed the procedure including the risks, benefits and alternatives for the proposed anesthesia with the patient or authorized representative who has indicated his/her understanding and acceptance.   Dental advisory given  Plan Discussed with: CRNA  Anesthesia Plan Comments:         Anesthesia Quick Evaluation

## 2013-11-10 NOTE — Progress Notes (Signed)
Patient refusing to allow RN or RT perform tracheostomy care or change inner cannula. Patient states, "I only want the doctor to do that." Dr. Kendrick Fries and Quincy Carnes, NP made aware.

## 2013-11-11 ENCOUNTER — Inpatient Hospital Stay (HOSPITAL_COMMUNITY): Payer: Medicare Other

## 2013-11-11 ENCOUNTER — Encounter (HOSPITAL_COMMUNITY): Payer: Self-pay | Admitting: Radiology

## 2013-11-11 DIAGNOSIS — Z930 Tracheostomy status: Secondary | ICD-10-CM

## 2013-11-11 DIAGNOSIS — J869 Pyothorax without fistula: Secondary | ICD-10-CM | POA: Diagnosis present

## 2013-11-11 LAB — CBC
HCT: 28.8 % — ABNORMAL LOW (ref 36.0–46.0)
HEMOGLOBIN: 9.6 g/dL — AB (ref 12.0–15.0)
MCH: 29.8 pg (ref 26.0–34.0)
MCHC: 33.3 g/dL (ref 30.0–36.0)
MCV: 89.4 fL (ref 78.0–100.0)
Platelets: 170 10*3/uL (ref 150–400)
RBC: 3.22 MIL/uL — ABNORMAL LOW (ref 3.87–5.11)
RDW: 17.1 % — AB (ref 11.5–15.5)
WBC: 15.5 10*3/uL — ABNORMAL HIGH (ref 4.0–10.5)

## 2013-11-11 LAB — COMPREHENSIVE METABOLIC PANEL
ALK PHOS: 121 U/L — AB (ref 39–117)
ALT: 6 U/L (ref 0–35)
AST: 10 U/L (ref 0–37)
Albumin: 1.5 g/dL — ABNORMAL LOW (ref 3.5–5.2)
Anion gap: 13 (ref 5–15)
BILIRUBIN TOTAL: 0.3 mg/dL (ref 0.3–1.2)
BUN: 49 mg/dL — ABNORMAL HIGH (ref 6–23)
CHLORIDE: 99 meq/L (ref 96–112)
CO2: 18 mEq/L — ABNORMAL LOW (ref 19–32)
Calcium: 7.8 mg/dL — ABNORMAL LOW (ref 8.4–10.5)
Creatinine, Ser: 2.44 mg/dL — ABNORMAL HIGH (ref 0.50–1.10)
GFR calc Af Amer: 23 mL/min — ABNORMAL LOW (ref >90)
GFR calc non Af Amer: 20 mL/min — ABNORMAL LOW (ref >90)
Glucose, Bld: 96 mg/dL (ref 70–99)
Potassium: 4.3 mEq/L (ref 3.7–5.3)
SODIUM: 130 meq/L — AB (ref 137–147)
TOTAL PROTEIN: 5.7 g/dL — AB (ref 6.0–8.3)

## 2013-11-11 MED ORDER — ACETAMINOPHEN 325 MG PO TABS
650.0000 mg | ORAL_TABLET | Freq: Four times a day (QID) | ORAL | Status: DC | PRN
  Administered 2013-11-11 – 2013-11-13 (×2): 650 mg via ORAL
  Filled 2013-11-11 (×2): qty 2

## 2013-11-11 MED ORDER — LEVETIRACETAM 100 MG/ML PO SOLN
500.0000 mg | Freq: Two times a day (BID) | ORAL | Status: DC
  Administered 2013-11-11 – 2013-11-19 (×15): 500 mg via ORAL
  Filled 2013-11-11 (×18): qty 5

## 2013-11-11 MED ORDER — MUPIROCIN 2 % EX OINT
1.0000 "application " | TOPICAL_OINTMENT | Freq: Two times a day (BID) | CUTANEOUS | Status: AC
  Administered 2013-11-11 – 2013-11-15 (×10): 1 via NASAL
  Filled 2013-11-11 (×2): qty 22

## 2013-11-11 MED ORDER — CHLORHEXIDINE GLUCONATE CLOTH 2 % EX PADS
6.0000 | MEDICATED_PAD | Freq: Every day | CUTANEOUS | Status: AC
  Administered 2013-11-11 – 2013-11-15 (×5): 6 via TOPICAL

## 2013-11-11 NOTE — Progress Notes (Signed)
PULMONARY / CRITICAL CARE MEDICINE   Name: [name] MRN: [id number] DOB: 02-23-1950    ADMISSION DATE:  11/09/2013 CONSULTATION DATE:  10/5  REFERRING MD :   Annabell Howells   CHIEF COMPLAINT:   Vent management and critical care medicine support   INITIAL PRESENTATION:  63 y/o F with a complicated PMH of neuromyelitis optica with blindness hemiparesis on the left side, HTN, cardiomyopathy, CAD, anemia of chronic illness, anxiety and depression, and tracheostomy w/ vent dependence. She has lived in [facility] for the past 4 years. She was discharged by [facility] in Aug 2015 after an acute hospitalization for acute renal failure in setting of obstructive uropathy and urinary tract sepsis. During this time she required bilateral urostomy tubes placed 8/11 and 8/12. She presents 10/5 being brought in by urology for follow up of her obstructive uropathy, left percutaneous nephrolithotomy and possible suprapubic tube. PCCM asked to assist w/ supportive medical care.   STUDIES:    SIGNIFICANT EVENTS: 10/6  s/p perc left nephrolithotomy and placement of SP cath, tx'd unit of blood in OR   SUBJECTIVE:  Pt denies acute complaints, hopeful to eat breakfast this am.  RN reports cardene gtt weaned off this am.    VITAL SIGNS: Temp:  [93.5 F (34.2 C)-98.7 F (37.1 C)] 98.7 F (37.1 C) (10/07 0400) Pulse Rate:  [65-85] 85 (10/07 0800) Resp:  [0-20] 20 (10/07 0800) BP: (120-186)/(52-102) 167/66 mmHg (10/07 0800) SpO2:  [93 %-100 %] 100 % (10/07 0800) FiO2 (%):  [30 %] 30 % (10/07 0740) Weight:  [145 lb 4.5 oz (65.9 kg)] 145 lb 4.5 oz (65.9 kg) (10/07 0400)  HEMODYNAMICS:    VENTILATOR SETTINGS: Vent Mode:  [-] PRVC FiO2 (%):  [30 %] 30 % Set Rate:  [20 bmp] 20 bmp Vt Set:  [500 mL] 500 mL PEEP:  [5 cmH20] 5 cmH20 Plateau Pressure:  [30 cmH20-34 cmH20] 34 cmH20  INTAKE / OUTPUT:  Intake/Output Summary (Last 24 hours) at 11/11/13 0853 Last data filed at 11/11/13 0800  Gross per 24 hour   Intake 3109.25 ml  Output    663 ml  Net 2446.25 ml    PHYSICAL EXAMINATION: General:  Chronically ill appearing AAF in NAD Neuro:  Awake, follows commands. Generalized weakness. Left sided hemiparesis w/ right sided UE contractions and very little LE strength.  HEENT:  Right eye crusted and erythremic  Cardiovascular:  rrr Lungs:  Full support, few scattered rhonchi    Abdomen:  Soft, non-tender + bowel sounds  Musculoskeletal:  L sided hemiparesis  Skin:  Generalized anasarca, stage 2 sacral decub   LABS:  CBC  Recent Labs Lab 11/09/13 1506 11/10/13 0500 11/10/13 1248 11/11/13 0520  WBC 15.1* 14.6*  --  15.5*  HGB 8.3* 7.8* 10.6* 9.6*  HCT 25.2* 23.9* 31.6* 28.8*  PLT 188 166  --  170   Coag's  Recent Labs Lab 11/09/13 1506  APTT 38*  INR 1.26   BMET  Recent Labs Lab 11/09/13 1506 11/10/13 0500 11/11/13 0520  NA 131* 131* 130*  K 4.2 4.2 4.3  CL 99 99 99  CO2 20 20 18*  BUN 50* 51* 49*  CREATININE 2.58* 2.49* 2.44*  GLUCOSE 98 92 96   Electrolytes  Recent Labs Lab 11/09/13 1506 11/10/13 0500 11/11/13 0520  CALCIUM 8.0* 7.9* 7.8*  MG 2.0  --   --   PHOS 4.9*  --   --    Sepsis Markers  Recent Labs Lab 11/09/13 1700  PROCALCITON  1.35   ABG  Recent Labs Lab 11/09/13 1538  PHART 7.396  PCO2ART 32.6*  PO2ART 123.0*   Liver Enzymes  Recent Labs Lab 11/09/13 1506 11/11/13 0520  AST 10 10  ALT 8 6  ALKPHOS 140* 121*  BILITOT 0.3 0.3  ALBUMIN 1.6* 1.5*   Cardiac Enzymes No results found for this basename: TROPONINI, PROBNP,  in the last 168 hours  Glucose  Recent Labs Lab 11/09/13 1211  GLUCAP 94    Imaging Portable Chest Xray In Am  11/10/2013   CLINICAL DATA:  Tracheostomy.  Pleural effusion.  EXAM: PORTABLE CHEST - 1 VIEW  COMPARISON:  09/14/2013.  FINDINGS: Tracheostomy tube and PICC line in stable position. Right chest tube tip projected over the mid chest. Scratched Tubing is noted over the left lung base, this  could represent a small caliber chest tube. Cardiomegaly. Diffuse bilateral new pulmonary alveolar infiltrates are noted. Bilateral pleural effusions are present on today's exam. These findings are consistent with congestive heart failure with pulmonary edema. No pneumothorax. No acute osseous abnormality .  IMPRESSION: 1. Tracheostomy tube and PICC line in stable position. Chest tubes in good anatomic position. 2. Cardiomegaly with interim appearance of bilateral pulmonary infiltrates and bilateral pleural effusions consistent with congestive heart failure and pulmonary edema.   Electronically Signed   By: Maisie Fus  Register   On: 11/10/2013 07:28   Dg C-arm 61-120 Min-no Report  11/10/2013   CLINICAL DATA: left perc   C-ARM 61-120 MINUTES  Fluoroscopy was utilized by the requesting physician.  No radiographic  interpretation.      ASSESSMENT / PLAN:  PULMONARY OETT trach (Chronic)  A: Chronic respiratory failure - trach, vent dependent Tracheostomy status Bilateral infected pleural effusions > cultured on admission, now both growing gram negative rods!!!; Not a good candidate for VATS Basilar R>L atx  P:   Vent stabilization protocol  PAD protocol  Continue to drain pleur-x tubes q 72hrs .  Trend CXR Place chest tubes to suction, monitor CXR and fluid culture Will need to remove pleurex, but will use pleurex for drainage right now F/u CT abdomen, may need CT chest   CARDIOVASCULAR RUE PICC (?8/8) >> A:  HTN  CAD, h/o cardiomyopathy  P:  Cont home meds: clonidine, cardura,  Hydralazine, isordil, & labetalol Tele  RENAL A:  Acute kidney injury - in setting of obstructive uropathy s/p bilateral percutaneous nephrostomy tubes (placed 8/11 and 8/12). Now s/p left perc nephrolithotomy w/ large 2cm stone removed & insertion of suprapubic cath  Hyponatremia  P:   Trend chemistry  Post op wound care per urology  CT today per urology   GASTROINTESTINAL A:   Protein calorie  malnutrition  P:   Nutritional consult Renal diet as tolerated   HEMATOLOGIC A:   Anemia of chronic disease - also has h/o positive stool guaiac.  Hgb was 7.4 on d/c in august. Did get 1 unit of blood during that stay.  S/P unit PRBC's in OR 10/6 P:  Continue darbopoietin, re assess ferritin 10/8 No heparin for now, PAS Transfuse for hgb <7.0   INFECTIOUS A:   Right eye conjunctivitis Recent UTI (treated w/ primaxin for psuedomonas and providcia stuartii in Aug 20015) Recent HCAP (NOS) Sacral decub  Empyema bilaterally > present on admission cultures P:   BCx2 10/5>>> UC 10/5>>> Sputum 10/5>>>GNR/GPR Left pleural fluid 10/5>>>Rare GNR Right pleural fluid 10/5>>>GPC and abundant GNR primaxin 10/5>>> Vanc 10/5 >>>  WOC  Isolation Cipro eye gtt's  ENDOCRINE A:  No acute process P:   Trend am chemistries   NEUROLOGIC A:   Neuromyelitis Optica Left sided hemiparesis Blind  P:   RASS goal: 0 Supportive care   Family updated:  Contact person: Donnelly Stagerhillips, Anita Sister [phone]   Interdisciplinary Family Meeting v Palliative Care Meeting: n/a   TODAY'S SUMMARY:  Complicated patient Admitted by Urology from [facility] for possible for f/u and possible nephrolithotomy +/- suprapubic tube. She is chronically vent dependant and we treated her back in August for UT sepsis, acute renal failure and obstructive uropathy which was when her nephrostomy tubes were placed. Now s/p left renal/ureteral stone > 2 cm. W/ insertion of new left left nephrostomy tube and suprapubic cath.  Course complicated by anemia & HTN.  Tx PRBC's in OR on 10/6. 10/7 > now with bilateral infected pleural space> will treat with pleurex drainage tubes for now, both will ultimately need to be removed if CT output is minimal.    Canary BrimBrandi Ollis, NP-C [facility] Pgr: [phone] or [phone]   Attending:  I have personally obtained a history, examined the patient, evaluated  laboratory and imaging results, formulated the assessment and plan and placed orders.  I have seen and examined the patient with nurse practitioner/resident and agree with the note above.   CC time 35 minutes  Heber CarolinaBrent Ruble Buttler, MD  PCCM Pager: [phone] Cell: [phone] If no response, call [phone]   11/11/2013, 8:53 AM

## 2013-11-11 NOTE — Op Note (Signed)
[name], [name]             ACCOUNT NO.:  [id number]  MEDICAL RECORD NO.:  [id number]  LOCATION:  1225                         FACILITY:  [facility]  PHYSICIAN:  Excell Seltzer. Annabell Howells, M.D.    DATE OF BIRTH:  11/03/1950  DATE OF PROCEDURE:  11/10/2013 DATE OF DISCHARGE:                              OPERATIVE REPORT   PROCEDURE: 1. Suprapubic cystostomy. 2. First Stage Left percutaneous nephrolithotomy for combined stone burden of     greater than 2 cm.  PREOPERATIVE DIAGNOSES: 1. Neurogenic bladder. 2. Left renal and ureteral stones with greater than 2 cm stone burden.  POSTOPERATIVE DIAGNOSES: 1. Neurogenic bladder. 2. Left renal and ureteral stones with greater than 2 cm stone burden.  SURGEON:  Excell Seltzer. Annabell Howells, M.D.  ASSISTANT:  Dr. Harlene Salts.  ANESTHESIA:  General.  BLOOD LOSS:  50 mL.  DRAINS:  A 20-French suprapubic catheter, 24-French left nephrostomy catheter, and 6-French Kumpe safety catheter.  SPECIMEN:  Stone fragments.  COMPLICATIONS:  None.  INDICATIONS:  [name] is an unfortunate 63 year old African American female, with end-stage multiple sclerosis with quadriplegia and a neurogenic bladder that has been managed with a Foley catheter.  She has some urethral erosion.  She was admitted to the hospital in August with sepsis and was found to have bilateral hydronephrosis.  There was a 4-mm stone in her bladder, that had probably passed from the right kidney and a 9 mm proximal left ureteral stone with obstruction with a 14-mm left renal stone as well as smaller stones in the kidney.  She has had bilateral percutaneous nephrostomy tubes in place since that time, and returns now for percutaneous management of left renal and ureteral stones and suprapubic cystostomy.  She is chronically on a ventilator and lives at [facility].  FINDINGS AND PROCEDURE:  She was admitted to the hospital the day prior to surgery, and seen by critical care for tracheostomy  management.  She was placed on imipenem based on her prior cultures and was given vancomycin this morning when she was taken to the operating room. Initially, she was placed in lithotomy position after induction of general anesthesia, and was fitted with PAS hose.  Her lower abdomen and genitalia were prepped with Betadine solution.  She was draped in usual sterile fashion.  She was then placed in a steep Trendelenburg position. A curved Lowsley retractor was then passed per urethra and placed against the anterior bladder wall.  A scalpel was used to cut down on the tip of the tractor, which was then brought through the incision.  A 20-French Foley catheter was then secured in the jaws of the retractor and pulled back into the bladder.  The balloon was filled with 10 mL of sterile fluid and held on traction against the anterior wall as 2-0 nylon sutures were used to close the incision and secure the suprapubic tube.  Cystoscopy was then performed with a 22-French scope and 12-degree lens. The position of the tube was found to be excellent in the dome of the bladder.  The patient was taken out of Trendelenburg position.  At this point, she was moved to another operating table and positioned in a modified flank position  on a beanbag with all pressure points padded.  An axillary roll was placed and pillows were placed between her legs.  Left flank up position was chosen because of the tracheostomy and contractions in both upper arms, made a prone position more problematic and dangerous.  Once she was positioned, her left nephrostomy tube was disconnected from drainage.  The site was prepped with Betadine solution and then she was draped in the usual sterile fashion.  An antegrade nephrostogram was then performed using 50:50 Omnipaque and saline.  This revealed good position of the nephrostomy tube through a lower pole puncture into the renal pelvis, but minimal flow into the  proximal ureter.  A superstiff guidewire was then passed through the nephrostomy catheter and I was able to only correlate in the renal pelvis.  The nephrostomy tube was removed.  A short Kumpe catheter was then used to direct the cast that wire into the proximal ureter.  Once it was in the proximal ureter, the Kumpe catheter was advanced.  The wire was removed and an antegrade urethrogram was performed, which revealed a filling defect in a proximal ureter consisting of what appeared to be 2 stones.  At this point, the guidewire was reinserted but I was unable to get it by the stone, so I left it in the proximal ureter.  At this point, the digital flexible ureteroscope was inserted alongside the wire through the nephrostomy tract, into the renal pelvis.  With this in position, I was able identify the UPJ.  There were several stones in this area.  A second wire was passed through the ureteroscope and advanced into the proximal ureter as well but once again, I was unable to get it by the stones.  At this point, the ureteroscope was backed out leaving the second wire in place as well.  The incision on the skin was enlarged to 2 cm with a knife and a Trackmaster balloon was then passed over the wire to the renal pelvis.  The balloon was inflated to 16 atmospheres and the nephrostomy sheath was advanced over the balloon into the renal pelvis.  At this point, a rigid nephroscope was used and several stone fragments were identified.  There were consistent with the fragments of the larger stone that had been in the kidney.  This stone fragmented very easily as it appeared to be made struvite.  Once all visible fragments were removed from this area, the digital flexible ureteroscope was advanced once again into the UPJ.  There were some additional fragments were identified.  These were removed with the use of an encompass basket and a Nitinol basket.  However, we were unable to advance the  ureteroscope to the level of the ureteral stones due to some narrowing in this area.  Multiple attempts were made to get to the stone, but they were unsuccessful, however, I was able to get a Sensor wire through the Kumpe catheter by the ureteral stones into the area of the bladder.  However, I could not advance a dual-lumen catheter or the Kumpe catheter, over the wire, beyond the stone because of the lack of stiffness in the wire.  The Sensor wire was then removed with the Kumpe catheter as far into the ureter as possible and an Amplatz superstiff wire was then inserted through the Kumpe catheter.  This was advanced by the stone and the Kumpe catheter could then be advanced beyond the stones into the distal ureter.  Contrast was instilled at intervals to  make sure that no ureteral perforation had occurred.  None was seen.  The ureter was actually rather delicate.  After a second attempt with the fiberoptic flexible scope, which was smaller caliber, which also would not pass down to the level of the stones, it was felt the best option.  At this point, it would be to leave the Kumpe catheter across the ureteral obstruction into the distal ureter and come back for a second procedure to remove those stones either antegrade or retrograde depending on the most accessible approach.  The guidewire was left in place through the nephrostomy sheath.  The Kumpe catheter was removed.  The sheath was then removed and a Kumpe the catheter was then replaced over the superstiff wire to the distal ureter.  The safety wire was then used to replace the nephrostomy sheath and a 24-French Ainsworth catheter was then passed into the renal pelvis.  The sheath was backed out.  The balloon was filled with 3 mL of sterile fluid and both the Kumpe catheter and the nephrostomy catheter were secured to the skin with 2-0 silk suture.  The guidewires were then removed.  Contrast was instilled through the  nephrostomy catheter, which revealed good position and also to the Kumpe catheter, which revealed position in the pelvis.  The nephrostomy sheath was then cut away from the Foley.  The Foley was placed to straight drainage.  The Kumpe catheter was capped.  A pressure dressing was applied.  The patient was then moved back on to the holding room stretcher and her anesthetic was reversed.  Of note, dressing had been applied to the suprapubic tube site after that procedure.  Her blood loss during the procedure was only about 50 mL. There were no complications.  This will be a first stage procedure.  A second stage procedure will be required.     Excell Seltzer. Annabell Howells, M.D.     JJW/MEDQ  D:  11/10/2013  T:  11/10/2013  Job:  [id number]  cc:   [facility]

## 2013-11-11 NOTE — Progress Notes (Signed)
Patient ID: [name], female   DOB: 04/01/1950, 63 y.o.   MRN: [id number] 1 Day Post-Op  Subjective: [name] is doing well post op.   She reports no significant pain.   Her Hgb is 9.6 and her Cr is stable.  She has pink urine in the SP tube and left NT bags.   Her UOP is borderline.    ROS:  Review of Systems  Constitutional: Negative for fever.  Gastrointestinal:       Minimal pain    Anti-infectives: Anti-infectives   Start     Dose/Rate Route Frequency Ordered Stop   11/10/13 1800  vancomycin (VANCOCIN) 500 mg in sodium chloride 0.9 % 100 mL IVPB     500 mg 100 mL/hr over 60 Minutes Intravenous Every 24 hours 11/09/13 1930     11/10/13 0600  ciprofloxacin (CIPRO) IVPB 400 mg  Status:  Discontinued     400 mg 200 mL/hr over 60 Minutes Intravenous 60 min pre-op 11/09/13 1403 11/09/13 1521   11/10/13 0400  imipenem-cilastatin (PRIMAXIN) 250 mg in sodium chloride 0.9 % 100 mL IVPB     250 mg 200 mL/hr over 30 Minutes Intravenous Every 12 hours 11/09/13 1930     11/09/13 1700  vancomycin (VANCOCIN) IVPB 1000 mg/200 mL premix     1,000 mg 200 mL/hr over 60 Minutes Intravenous  Once 11/09/13 1617 11/09/13 1822   11/09/13 1600  imipenem-cilastatin (PRIMAXIN) 500 mg in sodium chloride 0.9 % 100 mL IVPB     500 mg 200 mL/hr over 30 Minutes Intravenous  Once 11/09/13 1524 11/09/13 1623      Current Facility-Administered Medications  Medication Dose Route Frequency Provider Last Rate Last Dose  . 0.9 %  sodium chloride infusion  250 mL Intravenous PRN Simonne Martinet, NP      . antiseptic oral rinse (CPC / CETYLPYRIDINIUM CHLORIDE 0.05%) solution 7 mL  7 mL Mouth Rinse QID Simonne Martinet, NP   7 mL at 11/11/13 0400  . calcium acetate (PHOSLO) capsule 1,334 mg  1,334 mg Oral TID WC Heloise Purpura, MD   1,334 mg at 11/10/13 1631  . carbamazepine (TEGRETOL) chewable tablet 100 mg  100 mg Oral BID Heloise Purpura, MD   100 mg at 11/10/13 2221  . chlorhexidine (PERIDEX) 0.12 % solution 15  mL  15 mL Mouth Rinse BID Simonne Martinet, NP   15 mL at 11/10/13 2044  . ciprofloxacin (CILOXAN) 0.3 % ophthalmic solution 2 drop  2 drop Right Eye QID Simonne Martinet, NP   2 drop at 11/10/13 2225  . cloNIDine (CATAPRES) tablet 0.3 mg  0.3 mg Oral TID Heloise Purpura, MD   0.3 mg at 11/10/13 2216  . [START ON 11/14/2013] darbepoetin (ARANESP) injection 40 mcg  40 mcg Subcutaneous Q Sat-1800 Heloise Purpura, MD      . dextrose 5 %-0.45 % sodium chloride infusion   Intravenous Continuous Simonne Martinet, NP 50 mL/hr at 11/11/13 0757    . docusate (COLACE) 50 MG/5ML liquid 100 mg  100 mg Per Tube BID Otho Bellows, RPH   100 mg at 11/10/13 2224  . doxazosin (CARDURA) tablet 4 mg  4 mg Oral Q12H Heloise Purpura, MD   4 mg at 11/10/13 2221  . famotidine (PEPCID) tablet 20 mg  20 mg Oral Daily Heloise Purpura, MD      . feeding supplement (ENSURE) (ENSURE) pudding 1 Container  1 Container Oral TID BM Tenny Craw, RD      .  feeding supplement (PRO-STAT SUGAR FREE 64) liquid 30 mL  30 mL Oral QPC supper Lester Borden, MD   30 mL at 11/10/13 1745  . fentaNYL (SUBLIMAZE) injection 100 mcg  100 mcg Intravenous Q15 min PRN Peter E Babcock, NP   100 mcg at 11/10/13 1504  . fentaNYL (SUBLIMAZE) injection 100 mcg  100 mcg Intravenous Q2H PRN Peter E Babcock, NP      . ferrous sulfate tablet 325 mg  325 mg Oral BID WC Lester Borden, MD   325 mg at 11/10/13 1631  . hydrALAZINE (APRESOLINE) injection 10 mg  10 mg Intravenous Q4H PRN PWyn [id number] SE. OxfLFayrene FearElliot GurneygMRosey Bathta PertNP   10 mg at 10/0Wyn [id number] MLFayrene FearElliot GurneygMR<MEASUR 5 mg/hr at 11/11/13 0500  . oxyCODONE (Oxy IR/ROXICODONE) immediate release tablet 5 mg  5 mg Oral Q4H PRN Lester Borden, MD      . pantoprazole sodium (PROTONIX) 40 mg/20 mL oral suspension 40 mg  40 mg Per Tube Daily Terri L Green, RPH      . pregabalin (LYRICA) capsule 100 mg  100 mg Oral Daily Lester Borden, MD      . tiZANidine (ZANAFLEX) tablet 2 mg  2 mg Oral TID Lester Borden, MD   2 mg at 11/10/13 2221  . vancomycin (VANCOCIN) 500 mg in sodium chloride 0.9 % 100 mL IVPB  500 mg Intravenous Q24H Jessica Mack, RPH   500 mg at 11/10/13 1744     Objective: Vital signs in last 24 hours: Temp:  [93.5 F (34.2 C)-98.7 F (37.1 C)] 98.7 F (37.1 C) (10/07 0400) Pulse Rate:  [65-85] 85 (10/07 0800) Resp:  [0-20] 20 (10/07 0800) BP: (120-186)/(52-102) 167/66 mmHg (10/07 0800) SpO2:  [93 %-100 %] 100 % (10/07 0800) FiO2 (%):  [30 %] 30 % (10/07 0740) Weight:  [65.9 kg (145 lb 4.5 oz)] 65.9 kg (145 lb 4.5 oz) (10/07 0400)  Intake/Output from previous day: 10/06 0701 - 10/07 0700 In: 3109.3 [I.V.:1939.3; Blood:670; IV Piggyback:500] Out: 603 [Urine:553; Blood:50] Intake/Output this shift: Total I/O In: -  Out: 60 [Urine:60]   Physical Exam  Constitutional: She is well-developed, well-nourished, and in no distress.  Cardiovascular: Normal rate and regular rhythm.   Pulmonary/Chest:  On vent  Abdominal:  SP dressing is dry    Lab Results:   Recent Labs  11/10/13 0500 11/10/13 1248 11/11/13 0520  WBC 14.6*  --  15.5*  HGB 7.8* 10.6* 9.6*  HCT 23.9* 31.6* 28.8*  PLT 166  --  170   BMET  Recent  Labs  11/10/13 0500 11/11/13 0520  NA 131* 130*  K 4.2 4.3  CL 99 99  CO2 20 18*  GLUCOSE 92 96  BUN 51* 49*  CREATININE 2.49* 2.44*  CALCIUM 7.9* 7.8*   PT/INR  Recent Labs  11/09/13 1506  LABPROT 15.9*  INR 1.26   ABG  Recent Labs  11/09/13 1538  PHART 7.396  HCO3 19.6*    Studies/Results: Dg Chest Port 1 View  11/11/2013   CLINICAL DATA:  Tracheostomy tube.  EXAM: PORTABLE CHEST - 1 VIEW  COMPARISON:  11/10/2013.  FINDINGS: Tracheostomy tube, PICC line, chest tubes in stable position. Surgical to be noted over the upper abdomen bilaterally. Persistent cardiomegaly with persistent bilateral pulmonary infiltrates consistent with pulmonary edema. Persistent bilateral pleural effusions are noted. No pneumothorax. No acute osseous abnormality.  IMPRESSION: 1. Lines and tubes in stable position. 2. Persistent cardiomegaly with bilateral pulmonary alveolar infiltrates suggesting congestive heart failure pulmonary edema. Bilateral pneumonia cannot be excluded. Associated bilateral pleural effusions are again noted. These appear unchanged.   Electronically Signed   By: Maisie Fus  Register   On: 11/11/2013 07:58   Portable Chest Xray In Am  11/10/2013   CLINICAL DATA:  Tracheostomy.  Pleural effusion.  EXAM: PORTABLE CHEST - 1 VIEW  COMPARISON:  09/14/2013.  FINDINGS: Tracheostomy tube and PICC line in stable position. Right chest tube tip projected over the mid chest. Scratched Tubing is noted over the left lung base, this could represent a small caliber chest tube. Cardiomegaly. Diffuse bilateral new pulmonary alveolar infiltrates are noted. Bilateral pleural effusions are present on today's exam. These findings are consistent with congestive heart failure with pulmonary edema. No pneumothorax. No acute osseous abnormality .  IMPRESSION: 1. Tracheostomy tube and PICC line in stable position. Chest tubes in good anatomic position. 2. Cardiomegaly with interim appearance of bilateral  pulmonary infiltrates and bilateral pleural effusions consistent with congestive heart failure and pulmonary edema.   Electronically Signed   By: Maisie Fus  Register   On: 11/10/2013 07:28   Dg C-arm 61-120 Min-no Report  11/10/2013   CLINICAL DATA: left perc   C-ARM 61-120 MINUTES  Fluoroscopy was utilized by the requesting physician.  No radiographic  interpretation.      Assessment: s/p Procedure(s): LEFT NEPHROLITHOTOMY PERCUTANEOUS INSERTION OF SUPRAPUBIC CATHETER  She is doing well post op but has borderline UOP.    I was able to remove a moderate amount of stone material from the left kidney and proximal ureter, but she has 2 stones impacted further down that I couldn't access.   I have left a ureteral catheter in addition to her nephrostomy tube to dilate the ureter.   Plan: She is for a CT today to reassess the stone volume.  She is scheduled for a second look procedure next Tuesday at 7:30 am.   I would like for her to be admitted by critical care the day before. She could go back to [facility] when ok with critical care, unless it is felt that she should stay as inpatient pending the next procedure.  I will defer an increase in IVF to critical care.       LOS: 2 days    Anner Crete 11/11/2013

## 2013-11-11 NOTE — Progress Notes (Addendum)
Dr. Patsi Sears called to update on pts urine output. Pt has has 130cc total urine out of suprapubic catheter and bilateral nephrostomy tubes combined for 8 hours. Around 18cc/hour. MD aware and no new orders given for output. Pts left nephrostomy dressing was saturated. This tube was replaced during surgery yesterday. MD said to replace dressing instead of reinforce. Dressing was replaced. Pt is stable. Will continue to monitor.

## 2013-11-12 ENCOUNTER — Inpatient Hospital Stay (HOSPITAL_COMMUNITY): Payer: Medicare Other

## 2013-11-12 LAB — BASIC METABOLIC PANEL
ANION GAP: 12 (ref 5–15)
BUN: 49 mg/dL — ABNORMAL HIGH (ref 6–23)
CHLORIDE: 99 meq/L (ref 96–112)
CO2: 18 mEq/L — ABNORMAL LOW (ref 19–32)
Calcium: 7.9 mg/dL — ABNORMAL LOW (ref 8.4–10.5)
Creatinine, Ser: 2.65 mg/dL — ABNORMAL HIGH (ref 0.50–1.10)
GFR, EST AFRICAN AMERICAN: 21 mL/min — AB (ref >90)
GFR, EST NON AFRICAN AMERICAN: 18 mL/min — AB (ref >90)
Glucose, Bld: 93 mg/dL (ref 70–99)
Potassium: 4.3 mEq/L (ref 3.7–5.3)
SODIUM: 129 meq/L — AB (ref 137–147)

## 2013-11-12 LAB — CBC
HCT: 27.6 % — ABNORMAL LOW (ref 36.0–46.0)
Hemoglobin: 9.2 g/dL — ABNORMAL LOW (ref 12.0–15.0)
MCH: 29.7 pg (ref 26.0–34.0)
MCHC: 33.3 g/dL (ref 30.0–36.0)
MCV: 89 fL (ref 78.0–100.0)
PLATELETS: 171 10*3/uL (ref 150–400)
RBC: 3.1 MIL/uL — AB (ref 3.87–5.11)
RDW: 17.3 % — ABNORMAL HIGH (ref 11.5–15.5)
WBC: 14.8 10*3/uL — AB (ref 4.0–10.5)

## 2013-11-12 LAB — VANCOMYCIN, TROUGH: VANCOMYCIN TR: 18.6 ug/mL (ref 10.0–20.0)

## 2013-11-12 LAB — OSMOLALITY, URINE: OSMOLALITY UR: 201 mosm/kg — AB (ref 390–1090)

## 2013-11-12 LAB — FERRITIN: FERRITIN: 2031 ng/mL — AB (ref 10–291)

## 2013-11-12 MED ORDER — FUROSEMIDE 10 MG/ML IJ SOLN
40.0000 mg | Freq: Once | INTRAMUSCULAR | Status: AC
  Administered 2013-11-12: 40 mg via INTRAVENOUS
  Filled 2013-11-12: qty 4

## 2013-11-12 MED ORDER — VANCOMYCIN HCL IN DEXTROSE 750-5 MG/150ML-% IV SOLN: 750.0000 mg | INTRAVENOUS | Status: DC

## 2013-11-12 NOTE — Progress Notes (Addendum)
Patient ID: [name], female   DOB: 1950/12/26, 63 y.o.   MRN: [id number] 2 Days Post-Op  Subjective: [name] is doing well post op.   She reports no significant pain.   Her Hgb decreased slightly from 9.6 to 9.2 and her Cr is up from 2.44 to 2.65.  She has pink urine in the SP tube and left NT bags.   Her UOP is low x 24 hours.     ROS:  Review of Systems  Constitutional: Negative for fever.  Gastrointestinal:       Minimal pain    Anti-infectives: Anti-infectives   Start     Dose/Rate Route Frequency Ordered Stop   11/10/13 1800  vancomycin (VANCOCIN) 500 mg in sodium chloride 0.9 % 100 mL IVPB     500 mg 100 mL/hr over 60 Minutes Intravenous Every 24 hours 11/09/13 1930     11/10/13 0600  ciprofloxacin (CIPRO) IVPB 400 mg  Status:  Discontinued     400 mg 200 mL/hr over 60 Minutes Intravenous 60 min pre-op 11/09/13 1403 11/09/13 1521   11/10/13 0400  imipenem-cilastatin (PRIMAXIN) 250 mg in sodium chloride 0.9 % 100 mL IVPB     250 mg 200 mL/hr over 30 Minutes Intravenous Every 12 hours 11/09/13 1930     11/09/13 1700  vancomycin (VANCOCIN) IVPB 1000 mg/200 mL premix     1,000 mg 200 mL/hr over 60 Minutes Intravenous  Once 11/09/13 1617 11/09/13 1822   11/09/13 1600  imipenem-cilastatin (PRIMAXIN) 500 mg in sodium chloride 0.9 % 100 mL IVPB     500 mg 200 mL/hr over 30 Minutes Intravenous  Once 11/09/13 1524 11/09/13 1623      Current Facility-Administered Medications  Medication Dose Route Frequency Provider Last Rate Last Dose  . 0.9 %  sodium chloride infusion  250 mL Intravenous PRN Simonne Martinet, NP      . acetaminophen (TYLENOL) tablet 650 mg  650 mg Oral Q6H PRN Alyson Reedy, MD   650 mg at 11/11/13 2052  . antiseptic oral rinse (CPC / CETYLPYRIDINIUM CHLORIDE 0.05%) solution 7 mL  7 mL Mouth Rinse QID Simonne Martinet, NP   7 mL at 11/12/13 0441  . calcium acetate (PHOSLO) capsule 1,334 mg  1,334 mg Oral TID WC Heloise Purpura, MD   1,334 mg at 11/11/13 1611  .  carbamazepine (TEGRETOL) chewable tablet 100 mg  100 mg Oral BID Heloise Purpura, MD   100 mg at 11/11/13 2235  . chlorhexidine (PERIDEX) 0.12 % solution 15 mL  15 mL Mouth Rinse BID Simonne Martinet, NP   15 mL at 11/11/13 2053  . Chlorhexidine Gluconate Cloth 2 % PADS 6 each  6 each Topical Q0600 Anner Crete, MD   6 each at 11/12/13 0600  . ciprofloxacin (CILOXAN) 0.3 % ophthalmic solution 2 drop  2 drop Right Eye QID Simonne Martinet, NP   2 drop at 11/11/13 2100  . cloNIDine (CATAPRES) tablet 0.3 mg  0.3 mg Oral TID Heloise Purpura, MD   0.3 mg at 11/11/13 2235  . [START ON 11/14/2013] darbepoetin (ARANESP) injection 40 mcg  40 mcg Subcutaneous Q Sat-1800 Heloise Purpura, MD      . dextrose 5 %-0.45 % sodium chloride infusion   Intravenous Continuous Simonne Martinet, NP 50 mL/hr at 11/12/13 0555    . docusate (COLACE) 50 MG/5ML liquid 100 mg  100 mg Per Tube BID Otho Bellows, RPH   100 mg at 11/11/13 2235  .  doxazosin (CARDURA) tablet 4 mg  4 mg Oral Q12H Heloise PurpuraLester Borden, MD   4 mg at 11/11/13 2235  . famotidine (PEPCID) tablet 20 mg  20 mg Oral Daily Heloise PurpuraLester Borden, MD   20 mg at 11/11/13 0901  . feeding supplement (ENSURE) (ENSURE) pudding 1 Container  1 Container Oral TID BM Tenny CrawHeather S Winkler, RD   1 Container at 11/11/13 1510  . feeding supplement (PRO-STAT SUGAR FREE 64) liquid 30 mL  30 mL Oral QPC supper Heloise PurpuraLester Borden, MD   30 mL at 11/11/13 1810  . fentaNYL (SUBLIMAZE) injection 100 mcg  100 mcg Intravenous Q15 min PRN Simonne MartinetPeter E Babcock, NP   100 mcg at 11/10/13 1504  . fentaNYL (SUBLIMAZE) injection 100 mcg  100 mcg Intravenous Q2H PRN Simonne MartinetPeter E Babcock, NP      . ferrous sulfate tablet 325 mg  325 mg Oral BID WC Heloise PurpuraLester Borden, MD   325 mg at 11/11/13 1611  . hydrALAZINE (APRESOLINE) injection 10 mg  10 mg Intravenous Q4H PRN Simonne MartinetPeter E Babcock, NP   10 mg at 11/11/13 1104  . hydrALAZINE (APRESOLINE) tablet 100 mg  100 mg Oral 4 times per day Simonne MartinetPeter E Babcock, NP   100 mg at 11/12/13 0555  .  imipenem-cilastatin (PRIMAXIN) 250 mg in sodium chloride 0.9 % 100 mL IVPB  250 mg Intravenous Q12H Laurence SlateJessica Mack, RPH   250 mg at 11/12/13 0441  . isosorbide dinitrate (ISORDIL) tablet 20 mg  20 mg Oral TID Heloise PurpuraLester Borden, MD   20 mg at 11/11/13 2235  . labetalol (NORMODYNE) tablet 600 mg  600 mg Oral BID Heloise PurpuraLester Borden, MD   600 mg at 11/11/13 2235  . levETIRAcetam (KEPPRA) 100 MG/ML solution 500 mg  500 mg Oral BID Dannielle Huhustin George Zeigler, RPH   500 mg at 11/11/13 2235  . multivitamin with minerals tablet 1 tablet  1 tablet Oral Daily Heloise PurpuraLester Borden, MD   1 tablet at 11/11/13 0901  . mupirocin ointment (BACTROBAN) 2 % 1 application  1 application Nasal BID Anner CreteJohn J Whitney Hillegass, MD   1 application at 11/11/13 2234  . nicardipine (CARDENE) 40mg  in 0.83% saline 200ml IV DOUBLE STRENGTH infusion (0.2 mg/ml)  3-15 mg/hr Intravenous Continuous Anner CreteJohn J Naasia Weilbacher, MD   2.5 mg/hr at 11/11/13 0500  . oxyCODONE (Oxy IR/ROXICODONE) immediate release tablet 5 mg  5 mg Oral Q4H PRN Heloise PurpuraLester Borden, MD      . pantoprazole sodium (PROTONIX) 40 mg/20 mL oral suspension 40 mg  40 mg Per Tube Daily Otho Bellowserri L Green, RPH   40 mg at 11/11/13 0900  . pregabalin (LYRICA) capsule 100 mg  100 mg Oral Daily Heloise PurpuraLester Borden, MD   100 mg at 11/11/13 0901  . tiZANidine (ZANAFLEX) tablet 2 mg  2 mg Oral TID Heloise PurpuraLester Borden, MD   2 mg at 11/11/13 2235  . vancomycin (VANCOCIN) 500 mg in sodium chloride 0.9 % 100 mL IVPB  500 mg Intravenous Q24H Laurence SlateJessica Mack, RPH   500 mg at 11/11/13 1811     Objective: Vital signs in last 24 hours: Temp:  [98.6 F (37 C)-101.3 F (38.5 C)] 99.1 F (37.3 C) (10/08 0327) Pulse Rate:  [80-101] 84 (10/08 0327) Resp:  [0-21] 20 (10/08 0327) BP: (133-192)/(54-78) 143/65 mmHg (10/08 0327) SpO2:  [99 %-100 %] 100 % (10/08 0327) FiO2 (%):  [30 %-100 %] 30 % (10/08 0327) Weight:  [72 kg (158 lb 11.7 oz)] 72 kg (158 lb 11.7 oz) (10/08 0327)  Intake/Output  from previous day: 10/07 0701 - 10/08 0700 In: 1920 [P.O.:480;  I.V.:1140; IV Piggyback:300] Out: 585 [Urine:585] Intake/Output this shift: Total I/O In: 580 [I.V.:480; IV Piggyback:100] Out: 300 [Urine:300]   Physical Exam  Constitutional: She is well-developed, well-nourished, and in no distress.  Cardiovascular: Normal rate and regular rhythm.   Pulmonary/Chest:  On vent  Abdominal:  SP dressing is dry    Lab Results:   Recent Labs  11/11/13 0520 11/12/13 0550  WBC 15.5* 14.8*  HGB 9.6* 9.2*  HCT 28.8* 27.6*  PLT 170 171   BMET  Recent Labs  11/11/13 0520 11/12/13 0550  NA 130* 129*  K 4.3 4.3  CL 99 99  CO2 18* 18*  GLUCOSE 96 93  BUN 49* 49*  CREATININE 2.44* 2.65*  CALCIUM 7.8* 7.9*   PT/INR  Recent Labs  11/09/13 1506  LABPROT 15.9*  INR 1.26   ABG  Recent Labs  11/09/13 1538  PHART 7.396  HCO3 19.6*    Studies/Results: Ct Abdomen Pelvis Wo Contrast  11/11/2013   CLINICAL DATA:  Nephrolithiasis. Evaluate for residual stone burden after PCNL  EXAM: CT ABDOMEN AND PELVIS WITHOUT CONTRAST  TECHNIQUE: Multidetector CT imaging of the abdomen and pelvis was performed following the standard protocol without IV contrast.  COMPARISON:  September 14, 2013  FINDINGS: There are bilateral percutaneous renal catheters identified. There is air noted along the left percutaneous renal catheter. There is air in the midpole calices of left kidney. There is a left double pigtail ureteral catheter in good position. There are few nonobstructing stones within the left kidney, the largest measures 5.3 mm in the midpole left kidney. The number of left kidney stones are decreased compared to previous exam. The previously noted stone within the mid left ureter persists  The liver, spleen, pancreas gallbladder and adrenal glands are unremarkable but evaluation is limited without contrast. There is ascites in the abdomen. There is generalized edema within subcutaneous fat of the chest, abdomen and pelvis. There is mild atherosclerosis of the  abdominal aorta without aneurysmal dilatation. There is no small bowel obstruction or diverticulitis. Moderate bowel content is identified within the right colon.  A left suprapubic catheter is identified with the bladder decompressed limiting evaluation. There is ascites in the pelvis. Calcification at the posterior inferior aspect of bladder persists.  There are small bilateral pleural effusions with atelectasis of the posterior lung bases. There drains within the posterior bilateral lower chest. No acute abnormalities identified within the visualized bones.  IMPRESSION: Patient is status post bilateral percutaneous renal catheters. There is air noted along the left percutaneous renal catheter with air in the midpole calices of the left kidney. These may be due to post catheter insertion changes. The number of left kidney stones are decreased compared to previous exam. The previously noted left mid ureter stone is unchanged. There is a double pigtail left ureteral catheter without evidence of significant left hydronephrosis.   Electronically Signed   By: Sherian Rein M.D.   On: 11/11/2013 12:43   Dg Chest Port 1 View  11/11/2013   CLINICAL DATA:  Tracheostomy tube.  EXAM: PORTABLE CHEST - 1 VIEW  COMPARISON:  11/10/2013.  FINDINGS: Tracheostomy tube, PICC line, chest tubes in stable position. Surgical to be noted over the upper abdomen bilaterally. Persistent cardiomegaly with persistent bilateral pulmonary infiltrates consistent with pulmonary edema. Persistent bilateral pleural effusions are noted. No pneumothorax. No acute osseous abnormality.  IMPRESSION: 1. Lines and tubes in stable position. 2. Persistent  cardiomegaly with bilateral pulmonary alveolar infiltrates suggesting congestive heart failure pulmonary edema. Bilateral pneumonia cannot be excluded. Associated bilateral pleural effusions are again noted. These appear unchanged.   Electronically Signed   By: Maisie Fus  Register   On: 11/11/2013 07:58    Dg C-arm 61-120 Min-no Report  11/10/2013   CLINICAL DATA: left perc   C-ARM 61-120 MINUTES  Fluoroscopy was utilized by the requesting physician.  No radiographic  interpretation.      Assessment: s/p Procedure(s): LEFT NEPHROLITHOTOMY PERCUTANEOUS INSERTION OF SUPRAPUBIC CATHETER  She is doing well post op but has low UOP.    I was able to remove a moderate amount of stone material from the left kidney and proximal ureter, but she has 2 stones impacted further down that I couldn't access.   I have left a ureteral catheter in addition to her nephrostomy tube to dilate the ureter. There is a mid ureteral stone and a small renal pelvis fragment  Plan: .  She is scheduled for a second look procedure next Tuesday at 7:30 am.   I would like for her to be admitted by critical care the day before. She could go back to [facility] when ok with critical care, unless it is felt that she should stay as inpatient pending the next procedure.  I will defer an increase in IVF to critical care.     LOS: 3 days    JOHNSON, DAVID C 11/12/2013   I have seen and examined the patient and reviewed her labs and CT films with report.   Plan as above.

## 2013-11-12 NOTE — Progress Notes (Addendum)
PULMONARY / CRITICAL CARE MEDICINE   Name: [name] MRN: [id number] DOB: January 02, 1951    ADMISSION DATE:  11/09/2013 CONSULTATION DATE:  10/5  REFERRING MD :   Annabell Howells   CHIEF COMPLAINT:   Vent management and critical care medicine support   INITIAL PRESENTATION:  63 y/o F with a complicated PMH of neuromyelitis optica with blindness hemiparesis on the left side, HTN, cardiomyopathy, CAD, anemia of chronic illness, anxiety and depression, and tracheostomy w/ vent dependence. She has lived in [facility] for the past 4 years. She was discharged by [facility] in Aug 2015 after an acute hospitalization for acute renal failure in setting of obstructive uropathy and urinary tract sepsis. During this time she required bilateral urostomy tubes placed 8/11 and 8/12. She presents 10/5 being brought in by urology for follow up of her obstructive uropathy, left percutaneous nephrolithotomy and possible suprapubic tube. PCCM asked to assist w/ supportive medical care.   STUDIES:  CT abd/pelvis 10/7: The number of left kidney stones are decreased compared to previous exam. The previously noted left mid  ureter stone is unchanged. There is a double pigtail left ureteral catheter without evidence of significant left hydronephrosis.   SIGNIFICANT EVENTS: 10/6  s/p perc left nephrolithotomy and placement of SP cath, tx'd unit of blood in OR 10/7: GNRs growing from Pleur-x tubes bilaterally, right also had few GPCs. Placed both Pleur-x tubes to continuous drainage via sahara.  10/8: no new issues. Awaiting final culture data.. Both CTs flushed as minimal out-put.   SUBJECTIVE:  Pt denies acute complaints    VITAL SIGNS: Temp:  [98.6 F (37 C)-101.3 F (38.5 C)] 99.1 F (37.3 C) (10/08 0327) Pulse Rate:  [78-101] 84 (10/08 0751) Resp:  [0-21] 20 (10/08 0751) BP: (133-192)/(63-82) 165/78 mmHg (10/08 0751) SpO2:  [97 %-100 %] 100 % (10/08 0751) FiO2 (%):  [30 %-100 %] 30 % (10/08 0751) Weight:  [72 kg (158  lb 11.7 oz)] 72 kg (158 lb 11.7 oz) (10/08 0327)  HEMODYNAMICS:    VENTILATOR SETTINGS: Vent Mode:  [-] PRVC FiO2 (%):  [30 %-100 %] 30 % Set Rate:  [20 bmp] 20 bmp Vt Set:  [500 mL] 500 mL PEEP:  [5 cmH20] 5 cmH20 Plateau Pressure:  [17 cmH20-32 cmH20] 32 cmH20  INTAKE / OUTPUT:  Intake/Output Summary (Last 24 hours) at 11/12/13 0855 Last data filed at 11/12/13 0700  Gross per 24 hour  Intake   2000 ml  Output    525 ml  Net   1475 ml    PHYSICAL EXAMINATION: General:  Chronically ill appearing AAF in NAD Neuro:  Awake, follows commands. Generalized weakness. Left sided hemiparesis w/ right sided UE contractions and very little LE strength.  HEENT:  Right eye crusted and erythremic but appears improved when c/w admit  Cardiovascular:  rrr Lungs:  Full support, few scattered rhonchi. Bilateral Pleur-x to sxn     Abdomen:  Soft, non-tender + bowel sounds, S/p cath unremarkable.  Musculoskeletal:  L sided hemiparesis  Skin:  Generalized anasarca, stage 2 sacral decub   LABS:  CBC  Recent Labs Lab 11/10/13 0500 11/10/13 1248 11/11/13 0520 11/12/13 0550  WBC 14.6*  --  15.5* 14.8*  HGB 7.8* 10.6* 9.6* 9.2*  HCT 23.9* 31.6* 28.8* 27.6*  PLT 166  --  170 171   Coag's  Recent Labs Lab 11/09/13 1506  APTT 38*  INR 1.26   BMET  Recent Labs Lab 11/10/13 0500 11/11/13 0520 11/12/13 0550  NA 131*  130* 129*  K 4.2 4.3 4.3  CL 99 99 99  CO2 20 18* 18*  BUN 51* 49* 49*  CREATININE 2.49* 2.44* 2.65*  GLUCOSE 92 96 93   Electrolytes  Recent Labs Lab 11/09/13 1506 11/10/13 0500 11/11/13 0520 11/12/13 0550  CALCIUM 8.0* 7.9* 7.8* 7.9*  MG 2.0  --   --   --   PHOS 4.9*  --   --   --    Sepsis Markers  Recent Labs Lab 11/09/13 1700  PROCALCITON 1.35   ABG  Recent Labs Lab 11/09/13 1538  PHART 7.396  PCO2ART 32.6*  PO2ART 123.0*   Liver Enzymes  Recent Labs Lab 11/09/13 1506 11/11/13 0520  AST 10 10  ALT 8 6  ALKPHOS 140* 121*   BILITOT 0.3 0.3  ALBUMIN 1.6* 1.5*   Cardiac Enzymes No results found for this basename: TROPONINI, PROBNP,  in the last 168 hours  Glucose  Recent Labs Lab 11/09/13 1211  GLUCAP 94    Imaging Ct Abdomen Pelvis Wo Contrast  11/11/2013   CLINICAL DATA:  Nephrolithiasis. Evaluate for residual stone burden after PCNL  EXAM: CT ABDOMEN AND PELVIS WITHOUT CONTRAST  TECHNIQUE: Multidetector CT imaging of the abdomen and pelvis was performed following the standard protocol without IV contrast.  COMPARISON:  September 14, 2013  FINDINGS: There are bilateral percutaneous renal catheters identified. There is air noted along the left percutaneous renal catheter. There is air in the midpole calices of left kidney. There is a left double pigtail ureteral catheter in good position. There are few nonobstructing stones within the left kidney, the largest measures 5.3 mm in the midpole left kidney. The number of left kidney stones are decreased compared to previous exam. The previously noted stone within the mid left ureter persists  The liver, spleen, pancreas gallbladder and adrenal glands are unremarkable but evaluation is limited without contrast. There is ascites in the abdomen. There is generalized edema within subcutaneous fat of the chest, abdomen and pelvis. There is mild atherosclerosis of the abdominal aorta without aneurysmal dilatation. There is no small bowel obstruction or diverticulitis. Moderate bowel content is identified within the right colon.  A left suprapubic catheter is identified with the bladder decompressed limiting evaluation. There is ascites in the pelvis. Calcification at the posterior inferior aspect of bladder persists.  There are small bilateral pleural effusions with atelectasis of the posterior lung bases. There drains within the posterior bilateral lower chest. No acute abnormalities identified within the visualized bones.  IMPRESSION: Patient is status post bilateral percutaneous  renal catheters. There is air noted along the left percutaneous renal catheter with air in the midpole calices of the left kidney. These may be due to post catheter insertion changes. The number of left kidney stones are decreased compared to previous exam. The previously noted left mid ureter stone is unchanged. There is a double pigtail left ureteral catheter without evidence of significant left hydronephrosis.   Electronically Signed   By: Sherian Rein M.D.   On: 11/11/2013 12:43   Dg Chest Port 1 View  11/11/2013   CLINICAL DATA:  Tracheostomy tube.  EXAM: PORTABLE CHEST - 1 VIEW  COMPARISON:  11/10/2013.  FINDINGS: Tracheostomy tube, PICC line, chest tubes in stable position. Surgical to be noted over the upper abdomen bilaterally. Persistent cardiomegaly with persistent bilateral pulmonary infiltrates consistent with pulmonary edema. Persistent bilateral pleural effusions are noted. No pneumothorax. No acute osseous abnormality.  IMPRESSION: 1. Lines and tubes in stable position.  2. Persistent cardiomegaly with bilateral pulmonary alveolar infiltrates suggesting congestive heart failure pulmonary edema. Bilateral pneumonia cannot be excluded. Associated bilateral pleural effusions are again noted. These appear unchanged.   Electronically Signed   By: Maisie Fushomas  Register   On: 11/11/2013 07:58     ASSESSMENT / PLAN:  PULMONARY OETT trach (Chronic)  A: Chronic respiratory failure - trach, vent dependent Tracheostomy status Bilateral empyema > cultured on admission, now both growing gram negative rods!!!; Not a good candidate for VATS Basilar R>L atx  No sig output from CTs. Effusions appear very small per the view on CT abd... Possibly loculated on right   P:   Vent stabilization protocol  PAD protocol  Cont chest tubes to suction, monitor CXR and fluid culture Will need to remove pleur-x after    CARDIOVASCULAR RUE PICC (?8/8) >> A:  HTN  CAD, h/o cardiomyopathy  P:  Cont home meds:  clonidine, cardura,  Hydralazine, isordil, & labetalol Tele  RENAL A:  Acute kidney injury - in setting of obstructive uropathy s/p bilateral percutaneous nephrostomy tubes (placed 8/11 and 8/12). Now s/p left perc nephrolithotomy w/ large 2cm stone removed & insertion of suprapubic cath  Hyponatremia  Scr a little worse w/ new Metabolic acidosis  P:   Trend chemistry  Send Urine Osmo... If increased will give her lasix  Post op wound care per urology  Plan for second look procedure next Tuesday at 7:30 am.   GASTROINTESTINAL A:   Protein calorie malnutrition  P:   Nutritional consult Renal diet as tolerated   HEMATOLOGIC A:   Anemia of chronic disease - also has h/o positive stool guaiac.  Hgb was 7.4 on d/c in august. Did get 1 unit of blood during that stay.  S/P unit PRBC's in OR 10/6 P:  Continue darbopoietin, re assess ferritin 10/8 No heparin for now, PAS Transfuse for hgb <7.0   INFECTIOUS A:   Right eye conjunctivitis Recent UTI (treated w/ primaxin for psuedomonas and providcia stuartii in Aug 20015) Recent HCAP (NOS) Sacral decub  Empyema bilaterally > present on admission cultures P:   BCx2 10/5>>> UC 10/5>>> Sputum 10/5>>>GNR/GPR Left pleural fluid 10/5>>>Rare GNR Right pleural fluid 10/5>>>GPC and abundant GNR primaxin 10/5>>> Vanc 10/5 >>>  WOC  Isolation Cipro eye gtt's  ENDOCRINE A:  No acute process P:   Trend am chemistries   NEUROLOGIC A:   Neuromyelitis Optica Left sided hemiparesis Blind  P:   RASS goal: 0 Supportive care   Family updated:  Contact person: Darnelle Maffuccihillips, Anita Sister [phone]. Message left 10/8, will re-attempt to contact 10/9     Interdisciplinary Family Meeting v Palliative Care Meeting: n/a   TODAY'S SUMMARY:  Complicated patient Admitted by Urology from [facility] for possible for f/u and possible nephrolithotomy +/- suprapubic tube. She is chronically vent dependant and we treated her back in August for  UT sepsis, acute renal failure and obstructive uropathy which was when her nephrostomy tubes were placed. Now s/p left renal/ureteral stone > 2 cm. W/ insertion of new left left nephrostomy tube and suprapubic cath.  Course complicated by anemia & HTN.  Tx PRBC's in OR on 10/6. 10/7 > now with bilateral infected pleural space> will keep CTs in for another 24 hours. IF output stays about the same will d/c both tubes after we know what organism we are treating. Na dropped, cr increased. Looks volume overloaded. Will ck urine osmo and likely diurese. We will keep her here over the weekend pending  the second-look Urology plan d/t retained stones.   Anders Simmonds ACNP-BC [facility] Pulmonary/Critical Care Pager # [phone] OR # [phone] if no answer    Attending:  I have seen and examined the patient with nurse practitioner/resident and agree with the note above.   CC time 35 minutes  Heber Brownlee, MD [place] PCCM Pager: [phone] Cell: [phone] If no response, call [phone]   11/12/2013, 8:55 AM

## 2013-11-12 NOTE — Progress Notes (Signed)
ANTIBIOTIC CONSULT NOTE   Pharmacy Consult for vancomycin and Primaxin Indication: surgical prophylaxis and UTI with Hx MDR organisms  Allergies  Allergen Reactions  . Neurontin [Gabapentin] Other (See Comments)    Per MAR, unknown  . Xanax [Alprazolam] Other (See Comments)    Per MAR, unknown    Patient Measurements: Height: 5\' 1"  (154.9 cm) Weight: 158 lb 11.7 oz (72 kg) IBW/kg (Calculated) : 47.8  Vital Signs: Temp: 98.3 F (36.8 C) (10/08 0800) Temp Source: Oral (10/08 0800) BP: 187/88 mmHg (10/08 0900) Pulse Rate: 78 (10/08 0900)  Labs:  Recent Labs  11/10/13 0500 11/10/13 1248 11/11/13 0520 11/12/13 0550  WBC 14.6*  --  15.5* 14.8*  HGB 7.8* 10.6* 9.6* 9.2*  PLT 166  --  170 171  CREATININE 2.49*  --  2.44* 2.65*   Estimated Creatinine Clearance: 19.5 ml/min (by C-G formula based on Cr of 2.65).  Medical History: Past Medical History  Diagnosis Date  . Neuromyelitis optica   . Blindness   . Hemiparesis     left sided  . Hypertension   . Cardiomyopathy   . Coronary artery disease   . Ventilator dependent   . Respiratory failure   . Depression   . Anemia   . Seizures     Medications:  Scheduled:  . antiseptic oral rinse  7 mL Mouth Rinse QID  . calcium acetate  1,334 mg Oral TID WC  . carbamazepine  100 mg Oral BID  . chlorhexidine  15 mL Mouth Rinse BID  . Chlorhexidine Gluconate Cloth  6 each Topical Q0600  . ciprofloxacin  2 drop Right Eye QID  . cloNIDine  0.3 mg Oral TID  . [START ON 11/14/2013] darbepoetin (ARANESP) injection - NON-DIALYSIS  40 mcg Subcutaneous Q Sat-1800  . docusate  100 mg Per Tube BID  . doxazosin  4 mg Oral Q12H  . famotidine  20 mg Oral Daily  . feeding supplement (ENSURE)  1 Container Oral TID BM  . feeding supplement (PRO-STAT SUGAR FREE 64)  30 mL Oral QPC supper  . ferrous sulfate  325 mg Oral BID WC  . hydrALAZINE  100 mg Oral 4 times per day  . imipenem-cilastatin  250 mg Intravenous Q12H  . isosorbide  dinitrate  20 mg Oral TID  . labetalol  600 mg Oral BID  . levETIRAcetam  500 mg Oral BID  . multivitamin with minerals  1 tablet Oral Daily  . mupirocin ointment  1 application Nasal BID  . pantoprazole sodium  40 mg Per Tube Daily  . pregabalin  100 mg Oral Daily  . tiZANidine  2 mg Oral TID  . vancomycin  500 mg Intravenous Q24H   Infusions:  . dextrose 5 % and 0.45% NaCl 10 mL/hr at 11/12/13 [id number]   Assessment: 63 [facility] follow up of percutaneous nephrostomy tubes with nephrolithotomy w/ suprapubic tube placement 10/6. Has lived at [facility] for past 4 years. Previous admit in Aug for ARF in setting of obstructive uropathy and urosepsis, s/p bilateral urostomy tubes placed 8/11 and 8/12, Pseudomonas and Providencia on urine cx that admission. Pharmacy has been consulted to dose Primaxin and vancomycin for UTI, antibiotics have continued based on pleural fluid gram stains.   10/5 >> vancomycin >> 10/5 >> primaxin >>   Tmax: afeb WBCs: elevated Renal: SCr 2.65, CrCl 24 ml/min normalized, UOP decreased  Previous admission 8/8 blood x2: NGF 8/8 urine: Providencia stuartii (S=Priomaxin, Zosyn  only) Pseudomonas aeruginosa (S=aminoglycosides, Primaxin only)  This admission 10/5 trach aspirate: reincubated 10/5 urine: reincubated 10/5 urine (R nephrostomy): reincubated 10/5 urine: reincubated 10/5 R pleural fluid: multiple organisms present, no staph aureus and no grp B strep 10/5 L pleural fluid: GNR, incubated 10/6 blood: NGTD  Drug level / dose changes info: 10/8 1730 VT = __mcg/ml on 500mg  IV q24h (prior to 4th overall dose)  Goal of Therapy:  Vancomycin trough level 10-15 mcg/ml Primaxin per indication and renal function  Plan:  Day #3 vanco/imipenem  Continue Primaxin 250mg  IV q12h   Continue vancomycin 500mg  IV q24h   Plan for vancomycin trough this evening   Juliette Alcide, PharmD, BCPS.   Pager: [phone] 11/12/2013 10:38 AM

## 2013-11-12 NOTE — Progress Notes (Signed)
PHARMACY BRIEF NOTE - Drug Level Result  Consult for:  Vancomycin  The current dose of Vancomycin is 500 mg IV every 24 hours.  The trough level drawn prior to the 4th dose this evening is reported as 18.6 mcg/ml.  This level is above the therapeutic range, 10-15 mcg/ml.  Plan: Decrease the Vancomycin dose to 750 mg IV every 48 hours, with the next dose scheduled to be given on 11/14/13.  Polo Riley R.Ph. 11/12/2013 8:16 PM

## 2013-11-13 ENCOUNTER — Inpatient Hospital Stay (HOSPITAL_COMMUNITY): Payer: Medicare Other

## 2013-11-13 DIAGNOSIS — N139 Obstructive and reflux uropathy, unspecified: Secondary | ICD-10-CM

## 2013-11-13 DIAGNOSIS — J9620 Acute and chronic respiratory failure, unspecified whether with hypoxia or hypercapnia: Secondary | ICD-10-CM

## 2013-11-13 DIAGNOSIS — N170 Acute kidney failure with tubular necrosis: Secondary | ICD-10-CM

## 2013-11-13 LAB — BODY FLUID CULTURE: Special Requests: NORMAL

## 2013-11-13 LAB — CULTURE, RESPIRATORY W GRAM STAIN

## 2013-11-13 LAB — COMPREHENSIVE METABOLIC PANEL
ALK PHOS: 169 U/L — AB (ref 39–117)
ALT: 6 U/L (ref 0–35)
AST: 11 U/L (ref 0–37)
Albumin: 1.4 g/dL — ABNORMAL LOW (ref 3.5–5.2)
Anion gap: 14 (ref 5–15)
BUN: 52 mg/dL — ABNORMAL HIGH (ref 6–23)
CALCIUM: 8 mg/dL — AB (ref 8.4–10.5)
CO2: 17 meq/L — AB (ref 19–32)
Chloride: 99 mEq/L (ref 96–112)
Creatinine, Ser: 2.73 mg/dL — ABNORMAL HIGH (ref 0.50–1.10)
GFR calc Af Amer: 20 mL/min — ABNORMAL LOW (ref >90)
GFR, EST NON AFRICAN AMERICAN: 17 mL/min — AB (ref >90)
Glucose, Bld: 99 mg/dL (ref 70–99)
POTASSIUM: 4.1 meq/L (ref 3.7–5.3)
SODIUM: 130 meq/L — AB (ref 137–147)
Total Bilirubin: 0.4 mg/dL (ref 0.3–1.2)
Total Protein: 5.6 g/dL — ABNORMAL LOW (ref 6.0–8.3)

## 2013-11-13 LAB — URINE MICROSCOPIC-ADD ON

## 2013-11-13 LAB — TYPE AND SCREEN
ABO/RH(D): A POS
Antibody Screen: NEGATIVE
UNIT DIVISION: 0
UNIT DIVISION: 0
UNIT DIVISION: 0
Unit division: 0

## 2013-11-13 LAB — CBC
HCT: 27.2 % — ABNORMAL LOW (ref 36.0–46.0)
Hemoglobin: 9 g/dL — ABNORMAL LOW (ref 12.0–15.0)
MCH: 29.5 pg (ref 26.0–34.0)
MCHC: 33.1 g/dL (ref 30.0–36.0)
MCV: 89.2 fL (ref 78.0–100.0)
PLATELETS: 181 10*3/uL (ref 150–400)
RBC: 3.05 MIL/uL — AB (ref 3.87–5.11)
RDW: 17.1 % — ABNORMAL HIGH (ref 11.5–15.5)
WBC: 21.9 10*3/uL — AB (ref 4.0–10.5)

## 2013-11-13 LAB — CULTURE, RESPIRATORY: Special Requests: NORMAL

## 2013-11-13 LAB — URINALYSIS, ROUTINE W REFLEX MICROSCOPIC
Bilirubin Urine: NEGATIVE
GLUCOSE, UA: NEGATIVE mg/dL
Ketones, ur: NEGATIVE mg/dL
Nitrite: NEGATIVE
Protein, ur: >300 mg/dL — AB
Specific Gravity, Urine: 1.015 (ref 1.005–1.030)
Urobilinogen, UA: 1 mg/dL (ref 0.0–1.0)
pH: 6 (ref 5.0–8.0)

## 2013-11-13 LAB — SODIUM, URINE, RANDOM: SODIUM UR: 39 meq/L

## 2013-11-13 LAB — PREALBUMIN: Prealbumin: 8.4 mg/dL — ABNORMAL LOW (ref 17.0–34.0)

## 2013-11-13 LAB — CREATININE, URINE, RANDOM: CREATININE, URINE: 64.7 mg/dL

## 2013-11-13 MED ORDER — LIDOCAINE HCL 1 % IJ SOLN
INTRAMUSCULAR | Status: AC
Start: 2013-11-13 — End: 2013-11-14
  Filled 2013-11-13: qty 20

## 2013-11-13 MED ORDER — FUROSEMIDE 10 MG/ML IJ SOLN
40.0000 mg | Freq: Four times a day (QID) | INTRAMUSCULAR | Status: AC
  Administered 2013-11-13 (×2): 40 mg via INTRAVENOUS
  Filled 2013-11-13 (×2): qty 4

## 2013-11-13 NOTE — Progress Notes (Signed)
PULMONARY / CRITICAL CARE MEDICINE   Name: [name] MRN: [id number] DOB: 10-Apr-1950    ADMISSION DATE:  11/09/2013 CONSULTATION DATE:  11/09/2013  REFERRING MD :   Annabell Howells   CHIEF COMPLAINT:   Vent management and critical care medicine support   INITIAL PRESENTATION:  63 y/o F with a complicated PMH of neuromyelitis optica with blindness hemiparesis on the left side, HTN, cardiomyopathy, CAD, anemia of chronic illness, anxiety and depression, and tracheostomy w/ vent dependence. She has lived in [facility] for the past 4 years. She was discharged by [facility] in Aug 2015 after an acute hospitalization for acute renal failure in setting of obstructive uropathy and urinary tract sepsis. During this time she required bilateral urostomy tubes placed 8/11 and 8/12. She presents 10/5 being brought in by urology for follow up of her obstructive uropathy, left percutaneous nephrolithotomy and possible suprapubic tube. PCCM asked to assist w/ supportive medical care.   STUDIES:  CT abd/pelvis 10/7: The number of left kidney stones are decreased compared to previous exam. The previously noted left mid  ureter stone is unchanged. There is a double pigtail left ureteral catheter without evidence of significant left hydronephrosis.   SIGNIFICANT EVENTS: 10/6  s/p perc left nephrolithotomy and placement of SP cath, tx'd unit of blood in OR 10/7: GNRs growing from Pleur-x tubes bilaterally, right also had few GPCs. Placed both Pleur-x tubes to continuous drainage via sahara.  10/8: no new issues. Awaiting final culture data.. Both CTs flushed as minimal out-put.   SUBJECTIVE:  Not eating much, feels OK, wheezing   VITAL SIGNS: Temp:  [97.7 F (36.5 C)-100.2 F (37.9 C)] 100.2 F (37.9 C) (10/08 2324) Pulse Rate:  [73-93] 73 (10/09 0800) Resp:  [0-35] 18 (10/09 0800) BP: (131-181)/(65-89) 139/65 mmHg (10/09 0800) SpO2:  [99 %-100 %] 100 % (10/09 0800) FiO2 (%):  [30 %] 30 % (10/09 0850) Weight:   [71.5 kg (157 lb 10.1 oz)] 71.5 kg (157 lb 10.1 oz) (10/09 0500)  HEMODYNAMICS:    VENTILATOR SETTINGS: Vent Mode:  [-] PRVC FiO2 (%):  [30 %] 30 % Set Rate:  [20 bmp] 20 bmp Vt Set:  [500 mL] 500 mL PEEP:  [5 cmH20] 5 cmH20 Plateau Pressure:  [23 cmH20-42 cmH20] 37 cmH20  INTAKE / OUTPUT:  Intake/Output Summary (Last 24 hours) at 11/13/13 0936 Last data filed at 11/13/13 0800  Gross per 24 hour  Intake 1315.33 ml  Output    515 ml  Net 800.33 ml    PHYSICAL EXAMINATION:  Gen: comfortable on vent HEENT: NCAT, Trach in place PULM: Wheezing bilaterally CV: RRR, S1/S2 AB: BS+, nontender Ext: significant edema bilaterally Neuro: Awake on vent, interactive  LABS:  CBC  Recent Labs Lab 11/11/13 0520 11/12/13 0550 11/13/13 0544  WBC 15.5* 14.8* 21.9*  HGB 9.6* 9.2* 9.0*  HCT 28.8* 27.6* 27.2*  PLT 170 171 181   Coag's  Recent Labs Lab 11/09/13 1506  APTT 38*  INR 1.26   BMET  Recent Labs Lab 11/11/13 0520 11/12/13 0550 11/13/13 0544  NA 130* 129* 130*  K 4.3 4.3 4.1  CL 99 99 99  CO2 18* 18* 17*  BUN 49* 49* 52*  CREATININE 2.44* 2.65* 2.73*  GLUCOSE 96 93 99   Electrolytes  Recent Labs Lab 11/09/13 1506  11/11/13 0520 11/12/13 0550 11/13/13 0544  CALCIUM 8.0*  < > 7.8* 7.9* 8.0*  MG 2.0  --   --   --   --   PHOS  4.9*  --   --   --   --   < > = values in this interval not displayed. Sepsis Markers  Recent Labs Lab 11/09/13 1700  PROCALCITON 1.35   ABG  Recent Labs Lab 11/09/13 1538  PHART 7.396  PCO2ART 32.6*  PO2ART 123.0*   Liver Enzymes  Recent Labs Lab 11/09/13 1506 11/11/13 0520 11/13/13 0544  AST 10 10 11   ALT 8 6 6   ALKPHOS 140* 121* 169*  BILITOT 0.3 0.3 0.4  ALBUMIN 1.6* 1.5* 1.4*   Cardiac Enzymes No results found for this basename: TROPONINI, PROBNP,  in the last 168 hours  Glucose  Recent Labs Lab 11/09/13 1211  GLUCAP 94    Imaging Dg Chest Port 1 View  11/12/2013   CLINICAL DATA:   Followup for airspace disease  EXAM: PORTABLE CHEST - 1 VIEW  COMPARISON:  11/11/2013  FINDINGS: Right PICC line, right chest tube, left chest tube, and tracheostomy tube stable.  Mild to moderate cardiac enlargement. Significant vascular congestion. Moderately severe mixed alveolar and interstitial changes with small bilateral effusions. More focal infiltrate in the lingula and right lower lobe.  IMPRESSION: No change from prior study. Findings again consistent with significant congestive heart failure and pulmonary edema. Superimposed pneumonia not excluded.   Electronically Signed   By: Esperanza Heiraymond  Rubner M.D.   On: 11/12/2013 07:18     ASSESSMENT / PLAN:  PULMONARY OETT trach (Chronic)  A: Chronic respiratory failure - trach, vent dependent for years Tracheostomy status Bilateral empyema > no culture data back, minimal chest drain output; feel that drains are source of infection Basilar R>L atx  Wheezing from pulm edema P:   Vent stabilization protocol  PAD protocol  Remove pleurex catheters 10/9 as they are the source of infection and not draining Monitor CXR intermittently Diuresis 10/9  CARDIOVASCULAR RUE PICC (?8/8) >> A:  HTN  CAD, h/o cardiomyopathy  (unknown EF) P:  Cont home meds: clonidine, cardura,  Hydralazine, isordil, & labetalol Tele   RENAL A:  Acute kidney injury - hypervolemic, related to abx? Obstructive uropathy s/p bilateral percutaneous nephrostomy tubes (placed 8/11 and 8/12). Now s/p left perc nephrolithotomy w/ large 2cm stone removed & insertion of suprapubic cath  Hypervolemic Hyponatremia  P:   Trend chemistry  Continue lasix 10/9 Post op wound care per urology  Plan for second look procedure next Tuesday at 7:30 am. Send urine eos, Cr, Na Stop Vanc  GASTROINTESTINAL A:   Protein calorie malnutrition  P:   Nutritional consult Renal diet as tolerated + ensure Check pre-albumin Panda?  HEMATOLOGIC A:   Anemia of chronic disease - also  has h/o positive stool guaiac.  Hgb was 7.4 on d/c in august. Did get 1 unit of blood during that stay.  S/P unit PRBC's in OR 10/6 P:  Continue darbopoietin, re assess ferritin 10/8 No heparin for now, PAS Transfuse for hgb <7.0   INFECTIOUS A:   Rising WBC> no evidence of sepsis otherwise, multiple possible sources as below UTI (recently treated w/ primaxin for psuedomonas and providcia stuartii in Aug 2015) Sacral decub  Empyema bilaterally > present on admission cultures, due to pleurex drains which were continuously hooked to bulb catheters at [facility]  Right eye conjunctivitis BCx2 10/5>>> UC 10/5>>>> 100K GNR Sputum 10/5>>>non-pathogenic oral flora Left pleural fluid 10/5>>>Rare GNR Right pleural fluid 10/5>>>mulitple organisms, none predominant, no staph or strep P:   Abx: primaxin 10/5>>> Vanc 10/5 >>>10/9  10/9 plan:  Continue Imipenem  until cultures back pull pleurex bilaterally consider changing out PICC on 10/12 if WBC still rising D/c vanc WOC  Isolation Cipro eye gtt's  ENDOCRINE A:  No acute process P:   Trend am chemistries   NEUROLOGIC A:   Neuromyelitis Optica Left sided hemiparesis Blind  P:   RASS goal: 0 Supportive care   Family updated:  Contact person: Ellary, B[place] Sister [phone]. Message left 10/8 and again 10/9.  Ove    Interdisciplinary Family Meeting v Palliative Care Meeting: n/a   TODAY'S SUMMARY:  Complicated patient chronically vent dependent admitted for elective management of obstructive uropathy, s/p perc nephrostomy drain and suprapubic catheter.  This hospitalization complicated by bilateral pleural space infection due to pleurex from [facility] and UTI with rising serum Cr. Plan is to pull bilateral pleurex and diurese.  Have nurtition see for calorie count.  Would like to discuss overall goals of care with sister, but have been unable to reach her despite multiple attempts.    CC time 40 minutes  Heber Sonora,  MD [place] PCCM Pager: [phone] Cell: [phone] If no response, call [phone]   11/13/2013, 9:36 AM

## 2013-11-13 NOTE — Progress Notes (Signed)
Removed Inner Cannula due to high Peak & Plateau pressures. Both pressures decreased. Anders Simmonds informed and agrees.  Consistent with practice at [facility], where Pt resides.

## 2013-11-13 NOTE — Progress Notes (Deleted)
Wasted remaining fentanyl drip in sink; 58 cc of fentanyl in 250 cc NS, witnessed by Gwenevere Ghazi RN and myself.Gavynn Duvall, Georga Hacking, RN

## 2013-11-13 NOTE — Progress Notes (Deleted)
Fentanyl drip (2500 mcg in 250 ml NS) , 58cc wasted in sink and witnessed by Iran Ouch RN and myself.  No current facility-administered medications on file prior to encounter.   Current Outpatient Prescriptions on File Prior to Encounter  Medication Sig Dispense Refill  . chlorhexidine (PERIDEX) 0.12 % solution Use as directed 15 mLs in the mouth or throat 2 (two) times daily.      . darbepoetin (ARANESP) 40 MCG/0.4ML SOLN injection Inject 40 mcg into the skin every 7 (seven) days. Saturday      . docusate sodium 100 MG CAPS Take 100 mg by mouth 2 (two) times daily.  10 capsule  0  . ferrous sulfate 325 (65 FE) MG tablet Take 325 mg by mouth 2 (two) times daily.      . hydrALAZINE (APRESOLINE) 100 MG tablet Take 100 mg by mouth every 8 (eight) hours.       Marland Kitchen labetalol (NORMODYNE) 300 MG tablet Take 600 mg by mouth 2 (two) times daily. For blood pressure      . levETIRAcetam (KEPPRA) 500 MG tablet Take 500 mg by mouth 2 (two) times daily.       . Multiple Vitamins-Minerals (MULTIVITAMIN & MINERAL PO) Take 1 tablet by mouth daily.      Marland Kitchen omeprazole (PRILOSEC) 20 MG capsule Take 20 mg by mouth daily.      . promethazine (PHENERGAN) 25 MG tablet Take 12.5 mg by mouth every 6 (six) hours as needed for nausea or vomiting.      . simethicone (MYLICON) 80 MG chewable tablet Chew 80 mg by mouth every 6 (six) hours as needed for flatulence.      Marland Kitchen tiZANidine (ZANAFLEX) 2 MG tablet Take 2 mg by mouth every 8 (eight) hours.

## 2013-11-13 NOTE — Progress Notes (Signed)
eLink Physician-Brief Progress Note Patient Name: [name] DOB: 1950-02-16 MRN: [id number]   Date of Service  11/13/2013  HPI/Events of Note  Chronic resp failure, bilateral effusions, pleurex removed today, appears stable per camera  eICU Interventions  CXR stat f/u post pleurex removal     Intervention Category Minor Interventions: Routine modifications to care plan (e.g. PRN medications for pain, fever)  Ulas Zuercher 11/13/2013, 10:45 PM

## 2013-11-13 NOTE — Procedures (Signed)
Pt was prepped w/ sterile chlorhexidine. Localized w/ 1% lidocaine. Then sutures were removed. Cuff seals were broken free, then bilateral pleurx tubes were removed w/out difficulty.   Anders Simmonds ACNP-BC [facility] Pulmonary/Critical Care Pager # [phone] OR # [phone] if no answer  Heber [place], MD [place] [facility] Pager: [phone] Cell: [phone] If no response, call [phone]

## 2013-11-14 ENCOUNTER — Inpatient Hospital Stay (HOSPITAL_COMMUNITY): Payer: Medicare Other

## 2013-11-14 LAB — BASIC METABOLIC PANEL
Anion gap: 14 (ref 5–15)
BUN: 53 mg/dL — ABNORMAL HIGH (ref 6–23)
CALCIUM: 8 mg/dL — AB (ref 8.4–10.5)
CO2: 17 mEq/L — ABNORMAL LOW (ref 19–32)
Chloride: 98 mEq/L (ref 96–112)
Creatinine, Ser: 2.79 mg/dL — ABNORMAL HIGH (ref 0.50–1.10)
GFR calc Af Amer: 20 mL/min — ABNORMAL LOW (ref >90)
GFR, EST NON AFRICAN AMERICAN: 17 mL/min — AB (ref >90)
Glucose, Bld: 93 mg/dL (ref 70–99)
POTASSIUM: 4.3 meq/L (ref 3.7–5.3)
SODIUM: 129 meq/L — AB (ref 137–147)

## 2013-11-14 LAB — CBC WITH DIFFERENTIAL/PLATELET
BASOS ABS: 0 10*3/uL (ref 0.0–0.1)
Basophils Relative: 0 % (ref 0–1)
EOS ABS: 0.3 10*3/uL (ref 0.0–0.7)
EOS PCT: 2 % (ref 0–5)
HCT: 26.3 % — ABNORMAL LOW (ref 36.0–46.0)
Hemoglobin: 8.9 g/dL — ABNORMAL LOW (ref 12.0–15.0)
LYMPHS PCT: 5 % — AB (ref 12–46)
Lymphs Abs: 1 10*3/uL (ref 0.7–4.0)
MCH: 30.1 pg (ref 26.0–34.0)
MCHC: 33.8 g/dL (ref 30.0–36.0)
MCV: 88.9 fL (ref 78.0–100.0)
Monocytes Absolute: 1.1 10*3/uL — ABNORMAL HIGH (ref 0.1–1.0)
Monocytes Relative: 5 % (ref 3–12)
NEUTROS PCT: 88 % — AB (ref 43–77)
Neutro Abs: 17.8 10*3/uL — ABNORMAL HIGH (ref 1.7–7.7)
PLATELETS: 177 10*3/uL (ref 150–400)
RBC: 2.96 MIL/uL — AB (ref 3.87–5.11)
RDW: 16.9 % — ABNORMAL HIGH (ref 11.5–15.5)
WBC: 20.1 10*3/uL — AB (ref 4.0–10.5)

## 2013-11-14 MED ORDER — VITAMINS A & D EX OINT
TOPICAL_OINTMENT | CUTANEOUS | Status: AC
  Administered 2013-11-14: 5
  Filled 2013-11-14: qty 5

## 2013-11-14 NOTE — Progress Notes (Signed)
CRITICAL VALUE ALERT  Critical value received:  Moderate Gram negative rods-Klebsiella Pneumonia  (Body fluid culture)  Date of notification:  11-14-13  Time of notification:  10:34 am  Critical value read back:  yes  Nurse who received alert:  Roney Jaffe, RN  MD notified (1st page):  Dr. David Stall  Time of first page:  1226  MD notified (2nd page):   Time of second page:  Responding MD:  Dr. David Stall  Time MD responded:  [phone]

## 2013-11-14 NOTE — Progress Notes (Signed)
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 She has lived in [facility] for the past 4 years. [facility] an acute hospitalization for acute renal failure in setting of obstructive uropathy and urinary tract sepsis. During this time she required bilateral urostomy tubes placed 8/11 and 8/12. She presents 10/5 being brought in by urology for follow up of her obstructive uropathy, left percutaneous nephrolithotomy and possible suprapubic tube. PCCM asked to assist w/ supportive medical care.   STUDIES:  CT abd/pelvis 10/7: The number of left kidney stones are decreased compared to previous exam. The previously noted left mid  ureter stone is unchanged. There is a double pigtail left ureteral catheter without evidence of significant left hydronephrosis.  SIGNIFICANT EVENTS: 10/6  s/p perc left nephrolithotomy and placement of SP cath, tx'd unit of blood in OR 10/7: GNRs growing from Pleur-x tubes bilaterally, right also had few GPCs. Placed both Pleur-x tubes to continuous drainage via sahara.  10/8: no new issues. Awaiting final clture data.. Both CTs flushed as minimal out-put.   SUBJECTIVE:  No events overnight  VITAL SIGNS: Temp:  [97.6 F (36.4 C)-98 F (36.7 C)] 97.6 F (36.4 C) (10/10 0000) Pulse Rate:  [70-87] 87 (10/10 0900) Resp:  [0-21] 20 (10/10 0900) BP: (128-168)/(59-86) 158/78 mmHg (10/10 0900) SpO2:  [95 %-100 %] 100 % (10/10 0800) FiO2 (%):  [30 %] 30 % (10/10 0836) Weight:  [70 kg (154 lb 5.2 oz)] 70 kg  (154 lb 5.2 oz) (10/10 0500)  HEMODYNAMICS:    VENTILATOR SETTINGS: Vent Mode:  [-] PRVC FiO2 (%):  [30 %] 30 % Set Rate:  [20 bmp] 20 bmp Vt Set:  [500 mL] 500 mL PEEP:  [5 cmH20] 5 cmH20 Plateau Pressure:  [28 cmH20-35 cmH20] 28 cmH20  INTAKE / OUTPUT:  Intake/Output Summary (Last 24 hours) at 11/14/13 1103 Last data filed at 11/14/13 0600  Gross per 24 hour  Intake    850 ml  Output    540 ml  Net    310 ml    PHYSICAL EXAMINATION: General:  Chronically ill appearing AAF in NAD Neuro:  Awake, follows commands. Generalized weakness. Left sided hemiparesis w/ right sided UE contractions and very little LE strength.  HEENT:  Right eye crusted and erythremic but appears improved when c/w admit  Cardiovascular:  rrr Lungs:  Full support, few scattered rhonchi. Bilateral Pleur-x to sxn     Abdomen:  Soft, non-tender + bowel sounds, S/p cath unremarkable.  Musculoskeletal:  L sided hemiparesis  Skin:  Generalized anasarca, stage 2 sacral decub   LABS:  CBC  Recent Labs Lab 11/12/13 0550 11/13/13 0544 11/14/13 0540  WBC 14.8* 21.9* 20.1*  HGB 9.2* 9.0* 8.9*  HCT 27.6* 27.2* 26.3*  PLT 171 181 177   Coag's  Recent Labs Lab 11/09/13 1506  APTT 38*  INR 1.26   BMET  Recent Labs Lab 11/12/13 0550 11/13/13 0544 11/14/13 0540  NA 129* 130* 129*  K 4.3 4.1 4.3  CL 99 99 98  CO2  18* 17* 17*  BUN 49* 52* 53*  CREATININE 2.65* 2.73* 2.79*  GLUCOSE 93 99 93   Electrolytes  Recent Labs Lab 11/09/13 1506  11/12/13 0550 11/13/13 0544 11/14/13 0540  CALCIUM 8.0*  < > 7.9* 8.0* 8.0*  MG 2.0  --   --   --   --   PHOS 4.9*  --   --   --   --   < > = values in this interval not displayed. Sepsis Markers  Recent Labs Lab 11/09/13 1700  PROCALCITON 1.35   ABG  Recent Labs Lab 11/09/13 1538  PHART 7.396  PCO2ART 32.6*  PO2ART 123.0*   Liver Enzymes  Recent Labs Lab 11/09/13 1506 11/11/13 0520 11/13/13 0544  AST 10 10 11   ALT 8 6 6    ALKPHOS 140* 121* 169*  BILITOT 0.3 0.3 0.4  ALBUMIN 1.6* 1.5* 1.4*   Cardiac Enzymes No results found for this basename: TROPONINI, PROBNP,  in the last 168 hours  Glucose  Recent Labs Lab 11/09/13 1211  GLUCAP 94    Imaging Dg Chest Port 1 View  11/13/2013   CLINICAL DATA:  Bilateral pleural effusions.  EXAM: PORTABLE CHEST - 1 VIEW  COMPARISON:  November 12, 2013.  FINDINGS: Stable cardiomegaly. Tracheostomy tube and right-sided PICC line are unchanged in position. Bilateral chest tubes are noted without pneumothorax. Mild bilateral pleural effusions are noted and unchanged compared to prior exam, with right greater than left. Stable interstitial densities are noted throughout both lungs consistent with pulmonary edema.  IMPRESSION: Stable mild bilateral pleural effusions. Stable support apparatus. No pneumothorax is noted. Stable bilateral pulmonary edema.   Electronically Signed   By: Roque Lias M.D.   On: 11/13/2013 10:06     ASSESSMENT / PLAN:  PULMONARY OETT trach (Chronic)  A: Chronic respiratory failure - trach, vent dependent Tracheostomy status Bilateral empyema > cultured on admission, now both growing gram negative rods!!!; Not a good candidate for VATS Basilar R>L atx  No sig output from CTs. Effusions appear very small per the view on CT abd... Possibly loculated on right   P:   Full vent support (vent dependent at [facility]) PAD protocol  Cont chest tubes to suction, monitor CXR and fluid culture Will need to remove pleur-x after but will wait until output from CT is more stable  CARDIOVASCULAR RUE PICC (?8/8) >> A:  HTN  CAD, h/o cardiomyopathy  P:  Cont home meds: clonidine, cardura,  Hydralazine, isordil, & labetalol Tele  RENAL A:  Acute kidney injury - in setting of obstructive uropathy s/p bilateral percutaneous nephrostomy tubes (placed 8/11 and 8/12). Now s/p left perc nephrolithotomy w/ large 2cm stone removed & insertion of suprapubic cath   Hyponatremia  Scr a little worse w/ new Metabolic acidosis  P:   Trend chemistry  Send Urine Osmo... If increased will give her lasix  Post op wound care per urology  Plan for second look procedure next Tuesday at 7:30 am.  GASTROINTESTINAL A:   Protein calorie malnutrition  P:   TF as per nutrition Renal diet as tolerated   HEMATOLOGIC A:   Anemia of chronic disease - also has h/o positive stool guaiac.  Hgb was 7.4 on d/c in august. Did get 1 unit of blood during that stay.  S/P unit PRBC's in OR 10/6 P:  Continue darbopoietin, re assess ferritin 10/8 No heparin for now, PAS Transfuse for hgb <7.0   INFECTIOUS A:   Right eye conjunctivitis Recent  UTI (treated w/ primaxin for psuedomonas and providcia stuartii in Aug 20015) Recent HCAP (NOS) Sacral decub  Empyema bilaterally > present on admission cultures P:   BCx2 10/5>>> UC 10/5>>> Sputum 10/5>>>GNR/GPR Left pleural fluid 10/5>>>Rare GNR Right pleural fluid 10/5>>>GPC and abundant GNR Primaxin 10/5>>> Vanc 10/5 >>>  WOC  Isolation Cipro eye gtt's  ENDOCRINE A:  No acute process P:   Trend am chemistries   NEUROLOGIC A:   Neuromyelitis Optica Left sided hemiparesis Blind  P:   RASS goal: 0 Supportive care   Family updated:  Contact person: Kandie, [place] Sister [phone]. Message left 10/8, will re-attempt to contact 10/9   Interdisciplinary Family Meeting v Palliative Care Meeting: n/a  TODAY'S SUMMARY:  Complicated patient Admitted by Urology from [facility] for possible for f/u and possible nephrolithotomy +/- suprapubic tube. She is chronically vent dependant and we treated her back in August for UT sepsis, acute renal failure and obstructive uropathy which was when her nephrostomy tubes were placed. Now s/p left renal/ureteral stone > 2 cm. W/ insertion of new left left nephrostomy tube and suprapubic cath.  Course complicated by anemia & HTN.  Tx PRBC's in OR on 10/6. 10/7 > now with  bilateral infected pleural space> will keep CTs in for another 24 hours. IF output stays about the same will d/c both tubes after we know what organism we are treating. Na dropped, cr increased. Looks volume overloaded. Will ck urine osmo and likely diurese. We will keep her here over the weekend pending the second-look Urology plan d/t retained stones.  Needs palliative care involvement, will call on Monday.   CC time 35 minutes  Alyson Reedy, M.D. [facility] Pulmonary/Critical Care Medicine. Pager: [phone]. After hours pager: [phone].   11/14/2013, 11:03 AM

## 2013-11-14 NOTE — Progress Notes (Signed)
4 Days Post-Op Subjective: Patient reports no flank pain  Objective: Vital signs in last 24 hours: Temp:  [97.6 F (36.4 C)-98 F (36.7 C)] 97.6 F (36.4 C) (10/10 0000) Pulse Rate:  [70-87] 87 (10/10 0900) Resp:  [0-21] 20 (10/10 0900) BP: (126-168)/(59-86) 158/78 mmHg (10/10 0900) SpO2:  [95 %-100 %] 100 % (10/10 0600) FiO2 (%):  [30 %] 30 % (10/10 0836) Weight:  [70 kg (154 lb 5.2 oz)] 70 kg (154 lb 5.2 oz) (10/10 0500)  Intake/Output from previous day: 10/09 0701 - 10/10 0700 In: 1020 [P.O.:360; I.V.:460; IV Piggyback:200] Out: 540 [Urine:540] Intake/Output this shift:    Physical Exam:  Constitutional: Vital signs reviewed. WD WN in NAD   Eyes: PERRL, No scleral icterus.   Abdominal: Soft. Nephrostomy tube site ok Extremities: Edema  Urine from n-tube clear Lab Results:  Recent Labs  11/12/13 0550 11/13/13 0544 11/14/13 0540  HGB 9.2* 9.0* 8.9*  HCT 27.6* 27.2* 26.3*   BMET  Recent Labs  11/13/13 0544 11/14/13 0540  NA 130* 129*  K 4.1 4.3  CL 99 98  CO2 17* 17*  GLUCOSE 99 93  BUN 52* 53*  CREATININE 2.73* 2.79*  CALCIUM 8.0* 8.0*   No results found for this basename: LABPT, INR,  in the last 72 hours No results found for this basename: LABURIN,  in the last 72 hours Results for orders placed during the hospital encounter of 11/09/13  MRSA PCR SCREENING     Status: Abnormal   Collection Time    11/09/13  1:26 PM      Result Value Ref Range Status   MRSA by PCR POSITIVE (*) NEGATIVE Final   Comment:            The GeneXpert MRSA Assay (FDA     approved for NASAL specimens     only), is one component of a     comprehensive MRSA colonization     surveillance program. It is not     intended to diagnose MRSA     infection nor to guide or     monitor treatment for     MRSA infections.     RESULT CALLED TO, READ BACK BY AND VERIFIED WITH:     LISA FREI RN 11/09/2013 1928 BY BOVELL,T.  URINE CULTURE     Status: None   Collection Time     11/09/13  4:00 PM      Result Value Ref Range Status   Specimen Description URINE, RANDOM   Final   Special Requests Normal   Final   Culture  Setup Time     Final   Value: 11/09/2013 21:50     Performed at [facility] Count     Final   Value: >=100,000 COLONIES/ML     Performed at [facility]   Culture     Final   Value: GRAM NEGATIVE RODS     Performed at [facility]   Report Status PENDING   Incomplete  URINE CULTURE     Status: None   Collection Time    11/09/13  4:00 PM      Result Value Ref Range Status   Specimen Description URINE, CATHETERIZED   Final   Special Requests Normal   Final   Culture  Setup Time     Final   Value: 11/09/2013 21:52     Performed at [facility] Count  Final   Value: >=100,000 COLONIES/ML     Performed at [facility]   Culture     Final   Value: GRAM NEGATIVE RODS     Performed at [facility]   Report Status PENDING   Incomplete  URINE CULTURE     Status: None   Collection Time    11/09/13  4:00 PM      Result Value Ref Range Status   Specimen Description URINE, CATHETERIZED RIGHT   Final   Special Requests Normal   Final   Culture  Setup Time     Final   Value: 11/09/2013 21:51     Performed at [facility] Count     Final   Value: >=100,000 COLONIES/ML     Performed at [facility]   Culture     Final   Value: GRAM NEGATIVE RODS     Performed at [facility]   Report Status PENDING   Incomplete  CULTURE, BLOOD (ROUTINE X 2)     Status: None   Collection Time    11/09/13  4:20 PM      Result Value Ref Range Status   Specimen Description BLOOD LEFT FOOT   Final   Special Requests BOTTLES DRAWN AEROBIC ONLY 5CC   Final   Culture  Setup Time     Final   Value: 11/09/2013 22:55     Performed at [facility]   Culture     Final   Value:        BLOOD CULTURE RECEIVED NO GROWTH TO DATE CULTURE WILL BE HELD FOR 5 DAYS  BEFORE ISSUING A FINAL NEGATIVE REPORT     Performed at [facility]   Report Status PENDING   Incomplete  CULTURE, BLOOD (ROUTINE X 2)     Status: None   Collection Time    11/09/13  4:25 PM      Result Value Ref Range Status   Specimen Description BLOOD LEFT FOOT   Final   Special Requests BOTTLES DRAWN AEROBIC ONLY 3CC   Final   Culture  Setup Time     Final   Value: 11/09/2013 22:56     Performed at [facility]   Culture     Final   Value:        BLOOD CULTURE RECEIVED NO GROWTH TO DATE CULTURE WILL BE HELD FOR 5 DAYS BEFORE ISSUING A FINAL NEGATIVE REPORT     Performed at [facility]   Report Status PENDING   Incomplete  BODY FLUID CULTURE     Status: None   Collection Time    11/09/13  6:48 PM      Result Value Ref Range Status   Specimen Description PLEURAL RIGHT   Final   Special Requests Normal   Final   Gram Stain     Final   Value: RARE WBC PRESENT, PREDOMINANTLY MONONUCLEAR     ABUNDANT GRAM NEGATIVE RODS     FEW GRAM POSITIVE COCCI     IN PAIRS IN CLUSTERS Gram Stain Report Called to,Read Back By and Verified With: Gram Stain Report Called to,Read Back By and Verified With: ALLISON K AT 1300 ON 11/10/13/ BY WEBSP     Performed at [facility]   Culture     Final   Value: MULTIPLE ORGANISMS PRESENT, NONE PREDOMINANT     Note: NO STAPHYLOCOCCUS AUREUS ISOLATED NO GROUP A STREP (S.PYOGENES)  ISOLATED     [facility]   Report Status 11/13/2013 FINAL   Final  BODY FLUID CULTURE     Status: None   Collection Time    11/09/13  6:48 PM      Result Value Ref Range Status   Specimen Description PLEURAL LEFT   Final   Special Requests Normal   Final   Gram Stain     Final   Value: NO WBC SEEN     RARE GRAM NEGATIVE RODS     [facility]   Culture     Final   Value: MODERATE GRAM NEGATIVE RODS     [facility]formed at [facility]   Report Status PENDING   Incomplete  CULTURE, RESPIRATORY  (NON-EXPECTORATED)     Status: None   Collection Time    11/09/13  8:00 PM      Result Value Ref Range Status   Specimen Description TRACHEAL ASPIRATE   Final   Special Requests Normal   Final   Gram Stain     Final   Value: FEW WBC PRESENT,BOTH PMN AND MONONUCLEAR     RARE SQUAMOUS EPITHELIAL CELLS PRESENT     RARE GRAM NEGATIVE RODS     RARE GRAM POSITIVE RODS     [facility]formed at [facility]   Culture     Final   Value: Non-Pathogenic Oropharyngeal-type Flora Isolated.     Performed at [facility]   Report Status 11/13/2013 FINAL   Final    Studies/Results: Dg Chest Port 1 View  11/14/2013   CLINICAL DATA:  Follow up chronic respiratory failure and hypercapnia. Known bilateral pleural effusions.  EXAM: PORTABLE CHEST - 1 VIEW  COMPARISON:  Chest radiograph performed 11/13/2013  FINDINGS: The lungs remain hypoexpanded. Vascular congestion is noted, with diffusely increased interstitial markings, raising concern for mild pulmonary edema. This is mildly worsened from the prior study. Small bilateral pleural effusions are again seen. No pneumothorax is identified.  The patient's tracheostomy tube is seen ending 6 cm above the carina; only its distal tip is noted overlying the trachea. The right PICC is noted ending about the cavoatrial junction. The cardiomediastinal silhouette is mildly enlarged. No acute osseous abnormalities are seen.  IMPRESSION: 1. Lungs remain hypoexpanded. Vascular congestion and mild cardiomegaly, with diffusely increased interstitial markings, raising concern for mild pulmonary edema. The degree of pulmonary edema appears mildly worsened from the prior study. Small bilateral pleural effusions again seen. 2. The tracheostomy tube is seen ending 6 cm above the carina; only its distal tip is noted overlying the trachea.   Electronically Signed   By: Roanna RaiderJeffery  Chang M.D.   On: 11/14/2013 05:41   Dg Chest Port 1 View  11/13/2013   CLINICAL DATA:  Bilateral  pleural effusions.  EXAM: PORTABLE CHEST - 1 VIEW  COMPARISON:  November 12, 2013.  FINDINGS: Stable cardiomegaly. Tracheostomy tube and right-sided PICC line are unchanged in position. Bilateral chest tubes are noted without pneumothorax. Mild bilateral pleural effusions are noted and unchanged compared to prior exam, with right greater than left. Stable interstitial densities are noted throughout both lungs consistent with pulmonary edema.  IMPRESSION: Stable mild bilateral pleural effusions. Stable support apparatus. No pneumothorax is noted. Stable bilateral pulmonary edema.   Electronically Signed   By: Roque LiasJames  Green M.D.   On: 11/13/2013 10:06    Assessment/Plan:   S/p 1st stage right PCNL--scheduled for 2nd look procedure in 3 days  per Dr. Annabell Howells. No new GU recs   LOS: 5 days   Marcine Matar M 11/14/2013, 9:28 AM

## 2013-11-14 NOTE — Progress Notes (Signed)
Clinical Social Work Department BRIEF PSYCHOSOCIAL ASSESSMENT 11/14/2013  Patient:  LINDIA, GARMS     Account Number:  [id number]     Admit date:  11/09/2013  Clinical Social Worker:  Earlie Server  Date/Time:  11/14/2013 03:15 PM  Referred by:  Physician  Date Referred:  11/14/2013 Referred for  SNF Placement   Other Referral:   Interview type:  Patient Other interview type:    PSYCHOSOCIAL DATA Living Status:  FACILITY Admitted from facility:   Level of care:  [place] Primary support name:  Rodena Piety Primary support relationship to patient:  SIBLING Degree of support available:   Strong    CURRENT CONCERNS Current Concerns  Post-Acute Placement   Other Concerns:    SOCIAL WORK ASSESSMENT / PLAN CSW received referral due to patient being admitted from a facility. CSW reviewed chart and met with patient at bedside. CSW introduced myself and explained role.    Patient laying in bed and minimally engaged in assessment. Sister reports that patient has been at [facility] for about 4 years and plans to return at [facility]. CSW explained DC process and that CSW would assist with transfer back to SNF.    CSW completed FL2 and will continue to follow.   Assessment/plan status:  Psychosocial Support/Ongoing Assessment of Needs Other assessment/ plan:   Information/referral to community resources:   Will return to SNF    PATIENT'S/FAMILY'S RESPONSE TO PLAN OF CARE: Patient alert but minimally engaged. Patient shakes her head yes and no for majority of questions. Sister engaged and reports she tries and visit patient as often as she can. Sister reports that they are satisfied with patient's care at [facility] and want her to stay locally. Sister and patient agreeable for CSW to continue to follow.       Sindy Messing, LCSW (Weekend Coverage)

## 2013-11-14 NOTE — Progress Notes (Signed)
Urine output in 12 hours from supra pubic and right nephrostomy total 140 ml, malodorous urine.  Paged Dr. Dillard Cannon , urology to notify.

## 2013-11-15 ENCOUNTER — Inpatient Hospital Stay (HOSPITAL_COMMUNITY): Payer: Medicare Other

## 2013-11-15 DIAGNOSIS — J9610 Chronic respiratory failure, unspecified whether with hypoxia or hypercapnia: Secondary | ICD-10-CM

## 2013-11-15 DIAGNOSIS — I10 Essential (primary) hypertension: Secondary | ICD-10-CM

## 2013-11-15 DIAGNOSIS — N185 Chronic kidney disease, stage 5: Secondary | ICD-10-CM

## 2013-11-15 DIAGNOSIS — D631 Anemia in chronic kidney disease: Secondary | ICD-10-CM

## 2013-11-15 DIAGNOSIS — H109 Unspecified conjunctivitis: Secondary | ICD-10-CM

## 2013-11-15 DIAGNOSIS — J9621 Acute and chronic respiratory failure with hypoxia: Secondary | ICD-10-CM

## 2013-11-15 LAB — BASIC METABOLIC PANEL
ANION GAP: 13 (ref 5–15)
BUN: 56 mg/dL — ABNORMAL HIGH (ref 6–23)
CO2: 16 mEq/L — ABNORMAL LOW (ref 19–32)
Calcium: 7.9 mg/dL — ABNORMAL LOW (ref 8.4–10.5)
Chloride: 96 mEq/L (ref 96–112)
Creatinine, Ser: 2.91 mg/dL — ABNORMAL HIGH (ref 0.50–1.10)
GFR calc non Af Amer: 16 mL/min — ABNORMAL LOW (ref >90)
GFR, EST AFRICAN AMERICAN: 19 mL/min — AB (ref >90)
Glucose, Bld: 94 mg/dL (ref 70–99)
POTASSIUM: 4.5 meq/L (ref 3.7–5.3)
Sodium: 125 mEq/L — ABNORMAL LOW (ref 137–147)

## 2013-11-15 LAB — CULTURE, BLOOD (ROUTINE X 2)
CULTURE: NO GROWTH
CULTURE: NO GROWTH

## 2013-11-15 LAB — CBC
HCT: 24.4 % — ABNORMAL LOW (ref 36.0–46.0)
Hemoglobin: 8.1 g/dL — ABNORMAL LOW (ref 12.0–15.0)
MCH: 29.7 pg (ref 26.0–34.0)
MCHC: 33.2 g/dL (ref 30.0–36.0)
MCV: 89.4 fL (ref 78.0–100.0)
PLATELETS: 207 10*3/uL (ref 150–400)
RBC: 2.73 MIL/uL — ABNORMAL LOW (ref 3.87–5.11)
RDW: 17.4 % — AB (ref 11.5–15.5)
WBC: 16.7 10*3/uL — AB (ref 4.0–10.5)

## 2013-11-15 LAB — URINE CULTURE
Colony Count: >=100000
SPECIAL REQUESTS: NORMAL
Special Requests: NORMAL

## 2013-11-15 LAB — STONE ANALYSIS: STONE WEIGHT KSTONE: 0.043 g

## 2013-11-15 LAB — MAGNESIUM: MAGNESIUM: 2.1 mg/dL (ref 1.5–2.5)

## 2013-11-15 LAB — PHOSPHORUS: PHOSPHORUS: 4.6 mg/dL (ref 2.3–4.6)

## 2013-11-15 MED ORDER — DEXTROSE 5 % IV SOLN
0.9400 g | Freq: Two times a day (BID) | INTRAVENOUS | Status: DC
  Administered 2013-11-15 – 2013-11-19 (×8): 0.94 g via INTRAVENOUS
  Filled 2013-11-15 (×11): qty 4.51

## 2013-11-15 MED ORDER — METRONIDAZOLE IN NACL 5-0.79 MG/ML-% IV SOLN
500.0000 mg | Freq: Three times a day (TID) | INTRAVENOUS | Status: DC
  Administered 2013-11-15 – 2013-11-19 (×13): 500 mg via INTRAVENOUS
  Filled 2013-11-15 (×13): qty 100

## 2013-11-15 NOTE — Progress Notes (Signed)
CRITICAL VALUE ALERT  Critical value received:  Two different gram negative rods in pleural culture. 1- Providencia Stuartii and 2-Klebsiella ESBL producer and carbapenemase producer  Date of notification:  11/15/2013  Time of notification:  1017  Critical value read back: yes  Nurse who received alert:  Wynona Canes  MD notified (1st page):  Dr. Molli Knock  Time of first page:  1023  MD notified (2nd page):  Time of second page:  Responding MD:  Dr. Molli Knock  Time MD responded:  [phone]

## 2013-11-15 NOTE — Progress Notes (Addendum)
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 She has lived in [facility] for the past 4 years. [facility] after an acute hospitalization for acute renal failure in setting of obstructive uropathy and urinary tract sepsis. During this time she required bilateral urostomy tubes placed 8/11 and 8/12. She presents 10/5 being brought in by urology for follow up of her obstructive uropathy, left percutaneous nephrolithotomy and possible suprapubic tube. PCCM asked to assist w/ supportive medical care.   STUDIES:  CT abd/pelvis 10/7: The number of left kidney stones are decreased compared to previous exam. The previously noted left mid  ureter stone is unchanged. There is a double pigtail left ureteral catheter without evidence of significant left hydronephrosis.  SIGNIFICANT EVENTS: 10/6  s/p perc left nephrolithotomy and placement of SP cath, tx'd unit of blood in OR 10/7: GNRs growing from Pleur-x tubes bilaterally, right also had few GPCs. Placed both Pleur-x tubes to continuous drainage via sahara.  10/8: no new issues. Awaiting final clture data. Both CTs flushed as minimal out-put.   SUBJECTIVE:  No events overnight  VITAL SIGNS: Temp:  [98.2 F (36.8 C)-99.4 F (37.4 C)] 99.4 F (37.4 C) (10/11 0822) Pulse Rate:  [80-94] 89 (10/11 1000) Resp:  [0-20] 20 (10/11 1000) BP: (132-183)/(61-82) 143/68 mmHg (10/11 1000) SpO2:  [97 %-100 %] 100 % (10/11 1000) FiO2 (%):  [30 %] 30 % (10/11 1000)  HEMODYNAMICS:    VENTILATOR  SETTINGS: Vent Mode:  [-] PRVC FiO2 (%):  [30 %] 30 % Set Rate:  [20 bmp] 20 bmp Vt Set:  [500 mL] 500 mL PEEP:  [5 cmH20] 5 cmH20 Plateau Pressure:  [28 cmH20-33 cmH20] 28 cmH20  INTAKE / OUTPUT:  Intake/Output Summary (Last 24 hours) at 11/15/13 1119 Last data filed at 11/15/13 1000  Gross per 24 hour  Intake    660 ml  Output    675 ml  Net    -15 ml   PHYSICAL EXAMINATION: General:  Chronically ill appearing AAF in NAD Neuro:  Awake, follows commands. Generalized weakness. Left sided hemiparesis w/ right sided UE contractions and very little LE strength.  HEENT:  Right eye crusted and erythremic but appears improved when c/w admit, swelling on the right side of her face that is not tender (history of shingles) Cardiovascular:  rrr Lungs:  Full support, few scattered rhonchi. Bilateral Pleur-x to sxn     Abdomen:  Soft, non-tender + bowel sounds, S/p cath unremarkable.  Musculoskeletal:  L sided hemiparesis  Skin:  Generalized anasarca, stage 2 sacral decub   LABS:  CBC  Recent Labs Lab 11/13/13 0544 11/14/13 0540 11/15/13 0555  WBC 21.9* 20.1* 16.7*  HGB 9.0* 8.9* 8.1*  HCT 27.2* 26.3* 24.4*  PLT 181 177 207   Coag's  Recent Labs Lab 11/09/13 1506  APTT 38*  INR 1.26   BMET  Recent Labs Lab 11/13/13 0544 11/14/13 0540 11/15/13 0555  NA 130* 129* 125*  K 4.1 4.3 4.5  CL 99 98 96  CO2 17* 17* 16*  BUN 52* 53* 56*  CREATININE 2.73* 2.79* 2.91*  GLUCOSE 99 93 94   Electrolytes  Recent Labs Lab 11/09/13 1506  11/13/13 0544 11/14/13 0540 11/15/13 0555  CALCIUM 8.0*  < > 8.0* 8.0* 7.9*  MG 2.0  --   --   --  2.1  PHOS 4.9*  --   --   --  4.6  < > = values in this interval not displayed. Sepsis Markers  Recent Labs Lab 11/09/13 1700  PROCALCITON 1.35   ABG  Recent Labs Lab 11/09/13 1538  PHART 7.396  PCO2ART 32.6*  PO2ART 123.0*   Liver Enzymes  Recent Labs Lab 11/09/13 1506 11/11/13 0520 11/13/13 0544  AST 10 10 11   ALT  8 6 6   ALKPHOS 140* 121* 169*  BILITOT 0.3 0.3 0.4  ALBUMIN 1.6* 1.5* 1.4*   Cardiac Enzymes No results found for this basename: TROPONINI, PROBNP,  in the last 168 hours  Glucose  Recent Labs Lab 11/09/13 1211  GLUCAP 94   Imaging Dg Chest Port 1 View  11/14/2013   CLINICAL DATA:  Follow up chronic respiratory failure and hypercapnia. Known bilateral pleural effusions.  EXAM: PORTABLE CHEST - 1 VIEW  COMPARISON:  Chest radiograph performed 11/13/2013  FINDINGS: The lungs remain hypoexpanded. Vascular congestion is noted, with diffusely increased interstitial markings, raising concern for mild pulmonary edema. This is mildly worsened from the prior study. Small bilateral pleural effusions are again seen. No pneumothorax is identified.  The patient's tracheostomy tube is seen ending 6 cm above the carina; only its distal tip is noted overlying the trachea. The right PICC is noted ending about the cavoatrial junction. The cardiomediastinal silhouette is mildly enlarged. No acute osseous abnormalities are seen.  IMPRESSION: 1. Lungs remain hypoexpanded. Vascular congestion and mild cardiomegaly, with diffusely increased interstitial markings, raising concern for mild pulmonary edema. The degree of pulmonary edema appears mildly worsened from the prior study. Small bilateral pleural effusions again seen. 2. The tracheostomy tube is seen ending 6 cm above the carina; only its distal tip is noted overlying the trachea.   Electronically Signed   By: Roanna Raider M.D.   On: 11/14/2013 05:41   ASSESSMENT / PLAN:  PULMONARY OETT trach (Chronic)  A: Chronic respiratory failure - trach, vent dependent Tracheostomy status Bilateral empyema > cultured on admission, now both growing gram negative rods!!!; Not a good candidate for VATS Basilar R>L atx  No sig output from CTs. Effusions appear very small per the view on CT abd... Possibly loculated on right   P:   Full vent support (vent dependent  at [facility]). PAD protocol. Chest tubes out, date of removal unknown. Will need to remove pleur-x after but will wait until output from CT is more stable. Needs palliation at this point.  CARDIOVASCULAR RUE PICC (?8/8) >> A:  HTN  CAD, h/o cardiomyopathy  P:  Cont home meds: clonidine, cardura, hydralazine, isordil, & labetalol Tele  RENAL A:  Acute kidney injury - in setting of obstructive uropathy s/p bilateral percutaneous nephrostomy tubes (placed 8/11 and 8/12). Now s/p left perc nephrolithotomy w/ large 2cm stone removed & insertion of suprapubic cath  Hyponatremia  Scr a little worse w/ new Metabolic acidosis  P:   Trend chemistry  Replace electrolytes as indicated Post op wound care per urology  Plan for second look procedure next Tuesday at 7:30 am.  GASTROINTESTINAL A:   Protein calorie malnutrition  P:   TF as per nutrition  HEMATOLOGIC A:  Anemia of chronic disease - also has h/o positive stool guaiac.  Hgb was 7.4 on d/c in august. Did get 1 unit of blood during that stay.  S/P unit PRBC's in OR 10/6 P:  Continue darbopoietin, re assess ferritin 10/8 No heparin for now, PAS Transfuse for hgb <7.0   INFECTIOUS A:   Right eye conjunctivitis Recent UTI (treated w/ primaxin for psuedomonas and providcia stuartii in Aug 20015) Recent HCAP (NOS) Sacral decub  Empyema bilaterally > present on admission cultures P:   BCx2 10/5>>> UC 10/5>>>Klebsiella, providencia and pseudomonas (please see resistance pattern) Sputum 10/5>>>GNR/GPR Left pleural fluid 10/5>>>Rare GNR Right pleural fluid 10/5>>>GPC and abundant GNR Primaxin 10/5>>> Vanc 10/5 >>>  WOC  Isolation Cipro eye gtt's Will call ID to see given resistance pattern, they will likely change abx  ENDOCRINE A:  No acute process P:   Trend am chemistries   NEUROLOGIC A:   Neuromyelitis Optica Left sided hemiparesis Blind  P:   RASS goal: 0 Supportive care   Family updated:  Contact  person: Beulah, [place] Sister [phone]. Message left 10/8, will re-attempt to contact 10/12   Interdisciplinary Family Meeting v Palliative Care Meeting: n/a  TODAY'S SUMMARY:  Complicated patient Admitted by Urology from [facility] for possible for f/u and possible nephrolithotomy +/- suprapubic tube. She is chronically vent dependant and we treated her back in August for UT sepsis, acute renal failure and obstructive uropathy which was when her nephrostomy tubes were placed. Now s/p left renal/ureteral stone > 2 cm. W/ insertion of new left left nephrostomy tube and suprapubic cath.  Course complicated by anemia & HTN.  Tx PRBC's in OR on 10/6. 10/7 > now with bilateral infected pleural space> will keep CTs in for another 24 hours. IF output stays about the same will d/c both tubes after we know what organism we are treating. Na dropped, cr increased. Looks volume overloaded. Will ck urine osmo and likely diurese. We will keep her here over the weekend pending the second-look Urology plan d/t retained stones.  Needs palliative care involvement, will call on Monday.  Will involve ID for abx choice given resistance pattern of bacteria (please see culture results).  CC time 35 minutes  Alyson Reedy, M.D. [facility] Pulmonary/Critical Care Medicine. Pager: [phone]. After hours pager: [phone].   11/15/2013, 11:19 AM

## 2013-11-15 NOTE — Progress Notes (Addendum)
ANTIBIOTIC CONSULT NOTE - FOLLOW UP  Pharmacy Consult for Avycaz Indication: UTI, empyema  Allergies  Allergen Reactions  . Neurontin [Gabapentin] Other (See Comments)    Per MAR, unknown  . Xanax [Alprazolam] Other (See Comments)    Per MAR, unknown    Patient Measurements: Height: 5\' 1"  (154.9 cm) Weight: 163 lb 12.8 oz (74.3 kg) IBW/kg (Calculated) : 47.8  Vital Signs: Temp: 99 F (37.2 C) (10/11 1217) Temp Source: Oral (10/11 1217) BP: 136/65 mmHg (10/11 1218) Pulse Rate: 87 (10/11 1300) Intake/Output from previous day: 10/10 0701 - 10/11 0700 In: 640 [I.V.:440; IV Piggyback:200] Out: 545 [Urine:545]  Labs:  Recent Labs  11/13/13 0544 11/13/13 1122 11/14/13 0540 11/15/13 0555  WBC 21.9*  --  20.1* 16.7*  HGB 9.0*  --  8.9* 8.1*  PLT 181  --  177 207  LABCREA  --  64.7  --   --   CREATININE 2.73*  --  2.79* 2.91*   Estimated Creatinine Clearance: 18 ml/min (by C-G formula based on Cr of 2.91).  Recent Labs  11/12/13 1730  VANCOTROUGH 18.6     Microbiology: Recent Results (from the past 720 hour(s))  MRSA PCR SCREENING     Status: Abnormal   Collection Time    11/09/13  1:26 PM      Result Value Ref Range Status   MRSA by PCR POSITIVE (*) NEGATIVE Final   Comment:            The GeneXpert MRSA Assay (FDA     approved for NASAL specimens     only), is one component of a     comprehensive MRSA colonization     surveillance program. It is not     intended to diagnose MRSA     infection nor to guide or     monitor treatment for     MRSA infections.     RESULT CALLED TO, READ BACK BY AND VERIFIED WITH:     LISA FREI RN 11/09/2013 1928 BY BOVELL,T.  URINE CULTURE     Status: None   Collection Time    11/09/13  4:00 PM      Result Value Ref Range Status   Specimen Description URINE, RANDOM   Final   Special Requests Normal   Final   Culture  Setup Time     Final   Value: 11/09/2013 21:50     Performed at [facility] Count      Final   Value: >=100,000 COLONIES/ML     Performed at [facility]   Culture     Final   Value: GRAM NEGATIVE RODS     Performed at [facility]   Report Status PENDING   Incomplete  URINE CULTURE     Status: None   Collection Time    11/09/13  4:00 PM      Result Value Ref Range Status   Specimen Description URINE, CATHETERIZED   Final   Special Requests Normal   Final   Culture  Setup Time     Final   Value: 11/09/2013 21:52     Performed at [facility] Count     Final   Value: >=100,000 COLONIES/ML     Performed at [facility]   Culture     Final   Value: KLEBSIELLA PNEUMONIAE     PROVIDENCIA STUARTII     PSEUDOMONAS AERUGINOSA  Performed at [facility]   Report Status PENDING   Incomplete   Organism ID, Bacteria KLEBSIELLA PNEUMONIAE   Final   Organism ID, Bacteria PROVIDENCIA STUARTII   Final   Organism ID, Bacteria PSEUDOMONAS AERUGINOSA   Final  URINE CULTURE     Status: None   Collection Time    11/09/13  4:00 PM      Result Value Ref Range Status   Specimen Description URINE, CATHETERIZED RIGHT   Final   Special Requests Normal   Final   Culture  Setup Time     Final   Value: 11/09/2013 21:51     Performed at [facility] Count     Final   Value: >=100,000 COLONIES/ML     Performed at [facility]   Culture     Final   Value: GRAM NEGATIVE RODS     Performed at [facility]   Report Status PENDING   Incomplete  CULTURE, BLOOD (ROUTINE X 2)     Status: None   Collection Time    11/09/13  4:20 PM      Result Value Ref Range Status   Specimen Description BLOOD LEFT FOOT   Final   Special Requests BOTTLES DRAWN AEROBIC ONLY 5CC   Final   Culture  Setup Time     Final   Value: 11/09/2013 22:55     Performed at [facility]   Culture     Final   Value:        BLOOD CULTURE RECEIVED NO GROWTH TO DATE CULTURE WILL BE HELD FOR 5 DAYS BEFORE ISSUING A FINAL NEGATIVE  REPORT     Performed at [facility]   Report Status PENDING   Incomplete  CULTURE, BLOOD (ROUTINE X 2)     Status: None   Collection Time    11/09/13  4:25 PM      Result Value Ref Range Status   Specimen Description BLOOD LEFT FOOT   Final   Special Requests BOTTLES DRAWN AEROBIC ONLY 3CC   Final   Culture  Setup Time     Final   Value: 11/09/2013 22:56     Performed at [facility]   Culture     Final   Value:        BLOOD CULTURE RECEIVED NO GROWTH TO DATE CULTURE WILL BE HELD FOR 5 DAYS BEFORE ISSUING A FINAL NEGATIVE REPORT     Performed at [facility]   Report Status PENDING   Incomplete  BODY FLUID CULTURE     Status: None   Collection Time    11/09/13  6:48 PM      Result Value Ref Range Status   Specimen Description PLEURAL RIGHT   Final   Special Requests Normal   Final   Gram Stain     Final   Value: RARE WBC PRESENT, PREDOMINANTLY MONONUCLEAR     ABUNDANT GRAM NEGATIVE RODS     FEW GRAM POSITIVE COCCI     IN PAIRS IN CLUSTERS Gram Stain Report Called to,Read Back By and Verified With: Gram Stain Report Called to,Read Back By and Verified With: ALLISON K AT 1300 ON 11/10/13/ BY WEBSP     Performed at [facility]   Culture     Final   Value: MULTIPLE ORGANISMS PRESENT, NONE PREDOMINANT     Note: NO STAPHYLOCOCCUS AUREUS ISOLATED NO GROUP A STREP (S.PYOGENES) ISOLATED  Performed at [facility]   Report Status 11/13/2013 FINAL   Final  BODY FLUID CULTURE     Status: None   Collection Time    11/09/13  6:48 PM      Result Value Ref Range Status   Specimen Description PLEURAL LEFT   Final   Special Requests Normal   Final   Gram Stain     Final   Value: NO WBC SEEN     RARE GRAM NEGATIVE RODS     Performed at [facility]   Culture     Final   Value: MODERATE PROVIDENCIA STUARTII     ABUNDANT KLEBSIELLA PNEUMONIAE     Note: Confirmed Extended Spectrum Beta-Lactamase Producer (ESBL) CRITICAL RESULT CALLED TO,  READ BACK BY AND VERIFIED WITH: ANGELA ASHLEY 11/14/13 @ 10:34AM BY RUSCOE A. Confirmatory test for carbapenemase production is positive. COLISTIN=0.125ug/mL      ETEST results for this drug are "FOR INVESTIGATIONAL USE ONLY" and should NOT be used for clinical purposes. CRITICAL RESULT CALLED TO, READ BACK BY AND VERIFIED WITH: CELENIA SOSA 11/15/13 @ 10:17AM B RUSCOE A. CRITICAL RESULT CALLED TO, READ BACK BY      AND VERIFIED WITH: MELISSA MORGAN IN [facility] 11/15/13 @ 10:15AM BY RUSCOE A.     Performed at [facility]   Report Status 11/15/2013 FINAL   Final   Organism ID, Bacteria PROVIDENCIA STUARTII   Final   Organism ID, Bacteria KLEBSIELLA PNEUMONIAE   Final  CULTURE, RESPIRATORY (NON-EXPECTORATED)     Status: None   Collection Time    11/09/13  8:00 PM      Result Value Ref Range Status   Specimen Description TRACHEAL ASPIRATE   Final   Special Requests Normal   Final   Gram Stain     Final   Value: FEW WBC PRESENT,BOTH PMN AND MONONUCLEAR     RARE SQUAMOUS EPITHELIAL CELLS PRESENT     RARE GRAM NEGATIVE RODS     RARE GRAM POSITIVE RODS     Performed at [facility]   Culture     Final   Value: Non-Pathogenic Oropharyngeal-type Flora Isolated.     Performed at [facility]   Report Status 11/13/2013 FINAL   Final    Anti-infectives: 10/5 >> vancomycin >> 10/9 10/5 >> primaxin >> 10/11 10/11 >> Avycaz >>  10/11 >> metronidazole >>  Assessment: 63 yoF admitted 10/5 by urology for follow up of percutaneous nephrostomy tubes with nephrolithotomy w/ suprapubic tube placement 10/6. Has lived at [facility] for past 4 years. Previous admit in Aug for ARF in setting of obstructive uropathy and urosepsis, s/p bilateral urostomy tubes placement.  She is now found to bilateral empyema.  Pharmacy was initially consulted to dose Primaxin, but due to resistance pattern, consult is changed to Avycaz (Ceftazidime and avibactam) and metronidazole per ID.  Today,  10/11  Tmax: 99.4  WBCs: elevated but improving, 16.7   Renal: SCr increased to 2.91, CrCl ~ 17 ml/min  Positive culture results available: 10/5 urine (cath): > 100k KLEBSIELLA, PROVIDENCIA, PSEUDOMONAS  KLEBSIELLA (pan-resistant)  PROVIDENCIA (sens only to Zosyn)  PSEUDOMONAS (sens to gent, imi, tob)  10/5 L pleural fluid: PROVIDENCIA, KLEBSIELLA  PROVIDENCIA (sens: cefepime, ceftaz, imi, zosyn)  KLEBSIELLA (ESBL; intermediate to gent, otherwise pan-resistant)   Goal of Therapy:  Appropriate abx dosing, eradication of infection.   Plan:   Avycaz 0.94 g IV q12h  Ceftazidime 0.75 g + avibactam 0.19g  Usual dose of Avycaz is 2.5mg  IV q12h, but for CrCl 16 to 30 mL/min, using reduced dose.  Follow up renal function and cultures as available.  Lynann Beaverhristine Shaasia Odle PharmD, BCPS Pager [phone] 11/15/2013 1:47 PM

## 2013-11-15 NOTE — Consult Note (Signed)
[place] for Infectious Disease    Date of Admission:  11/09/2013  Date of Consult:  11/15/2013  Reason for Consult: CRE , MDR providencia in pleural effusion, several MDR in urine Referring Physician: Dr. Nelda Marseille   HPI: Nanna Ertle is an 63 y.o. female with severe Neuromyelitis optica, hemiparesis,  Blindness, Vent Dependent  Respiratory failure sp tracheostomy who lives at [facility]. She was admitted to [facility] in August. In  Aug 2015 she was admitted  for acute renal failure in setting of obstructive uropathy and urinary tract infection with MDR Pseudomonas and [place] S to imipenen . During this time she required bilateral urostomy tubes placed 8/11 and 8/12 and was dc to complete course of imipenem.   She presents 10/5 being brought in by urology for follow up of her obstructive uropathy, left percutaneous nephrolithotomy. She had suprapubic catheter placed by Urology during this admission. Her urine culture from admission is growing 3 MDR GNR species, Providencia, Klebsiella and Pseudomonas. She had pleurodex placed at [facility] and had pleural effusion sent for culture and it also is growing MDR organisms including a CRE and MDR providencia from Pleural fluid.   It is NOT clear to me if these cultures were taken directly from pleural fluid or instead if they were sampled from the pleural tubes and therefore could represent colonization rather than infection.          Past Medical History  Diagnosis Date  . Neuromyelitis optica   . Blindness   . Hemiparesis     left sided  . Hypertension   . Cardiomyopathy   . Coronary artery disease   . Ventilator dependent   . Respiratory failure   . Depression   . Anemia   . Seizures     Past Surgical History  Procedure Laterality Date  . Tracheostomy    . Nephrolithotomy Left 11/10/2013    Procedure: LEFT NEPHROLITHOTOMY PERCUTANEOUS;  Surgeon: Malka So, MD;  Location: [facility];  Service: Urology;  Laterality:  Left;  . Insertion of suprapubic catheter N/A 11/10/2013    Procedure: INSERTION OF SUPRAPUBIC CATHETER;  Surgeon: Malka So, MD;  Location: [facility];  Service: Urology;  Laterality: N/A;  ergies:   Allergies  Allergen Reactions  . Neurontin [Gabapentin] Other (See Comments)    Per MAR, unknown  . Xanax [Alprazolam] Other (See Comments)    Per MAR, unknown     Medications: I have reviewed patients current medications as documented in Epic Anti-infectives   Start     Dose/Rate Route Frequency Ordered Stop   11/15/13 1500  ceftazidime-avibactam (AVYCAZ) 0.94 g in dextrose 5 % 50 mL IVPB     0.94 g 25 mL/hr over 2 Hours Intravenous Every 12 hours 11/15/13 1427     11/15/13 1300  metroNIDAZOLE (FLAGYL) IVPB 500 mg     500 mg 100 mL/hr over 60 Minutes Intravenous Every 8 hours 11/15/13 1259     11/14/13 1800  vancomycin (VANCOCIN) IVPB 750 mg/150 ml premix  Status:  Discontinued     750 mg 150 mL/hr over 60 Minutes Intravenous Every 48 hours 11/12/13 2007 11/13/13 0938   11/10/13 1800  vancomycin (VANCOCIN) 500 mg in sodium chloride 0.9 % 100 mL IVPB  Status:  Discontinued     500 mg 100 mL/hr over 60 Minutes Intravenous Every 24 hours 11/09/13 1930 11/12/13 2007   11/10/13 0600  ciprofloxacin (CIPRO) IVPB 400 mg  Status:  Discontinued     400  mg 200 mL/hr over 60 Minutes Intravenous 60 min pre-op 11/09/13 1403 11/09/13 1521   11/10/13 0400  imipenem-cilastatin (PRIMAXIN) 250 mg in sodium chloride 0.9 % 100 mL IVPB  Status:  Discontinued     250 mg 200 mL/hr over 30 Minutes Intravenous Every 12 hours 11/09/13 1930 11/15/13 1259   11/09/13 1700  vancomycin (VANCOCIN) IVPB 1000 mg/200 mL premix     1,000 mg 200 mL/hr over 60 Minutes Intravenous  Once 11/09/13 1617 11/09/13 1822   11/09/13 1600  imipenem-cilastatin (PRIMAXIN) 500 mg in sodium chloride 0.9 % 100 mL IVPB     500 mg 200 mL/hr over 30 Minutes Intravenous  Once 11/09/13 1524 11/09/13 1623      Social History:  reports  that she has never smoked. She has never used smokeless tobacco. She reports that she does not drink alcohol or use illicit drugs.  History reviewed. No pertinent family history.  As in HPI and primary teams notes otherwise 12 point review of systems is negative  Blood pressure 160/81, pulse 89, temperature 99.4 F (37.4 C), temperature source Oral, resp. rate 21, height '5\' 1"'  (1.549 m), weight 163 lb 12.8 oz (74.3 kg), SpO2 100.00%. General: on ventilator, opens eyes to name CV: RRR tachycardic Pulm: fairly ctab, GI: soft nondistended MSK: contractures Nephrostomy tube with blood tinged urine, suprapubic catheter is clean. Neurology: contractures moves eyes around when spoken to   Results for orders placed during the hospital encounter of 11/09/13 (from the past 48 hour(s))  BASIC METABOLIC PANEL     Status: Abnormal   Collection Time    11/14/13  5:40 AM      Result Value Ref Range   Sodium 129 (*) 137 - 147 mEq/L   Potassium 4.3  3.7 - 5.3 mEq/L   Chloride 98  96 - 112 mEq/L   CO2 17 (*) 19 - 32 mEq/L   Glucose, Bld 93  70 - 99 mg/dL   BUN 53 (*) 6 - 23 mg/dL   Creatinine, Ser 2.79 (*) 0.50 - 1.10 mg/dL   Calcium 8.0 (*) 8.4 - 10.5 mg/dL   GFR calc non Af Amer 17 (*) >90 mL/min   GFR calc Af Amer 20 (*) >90 mL/min   Comment: (NOTE)     The eGFR has been calculated using the CKD EPI equation.     This calculation has not been validated in all clinical situations.     eGFR's persistently <90 mL/min signify possible Chronic Kidney     Disease.   Anion gap 14  5 - 15  CBC WITH DIFFERENTIAL     Status: Abnormal   Collection Time    11/14/13  5:40 AM      Result Value Ref Range   WBC 20.1 (*) 4.0 - 10.5 K/uL   RBC 2.96 (*) 3.87 - 5.11 MIL/uL   Hemoglobin 8.9 (*) 12.0 - 15.0 g/dL   HCT 26.3 (*) 36.0 - 46.0 %   MCV 88.9  78.0 - 100.0 fL   MCH 30.1  26.0 - 34.0 pg   MCHC 33.8  30.0 - 36.0 g/dL   RDW 16.9 (*) 11.5 - 15.5 %   Platelets 177  150 - 400 K/uL   Neutrophils  Relative % 88 (*) 43 - 77 %   Neutro Abs 17.8 (*) 1.7 - 7.7 K/uL   Lymphocytes Relative 5 (*) 12 - 46 %   Lymphs Abs 1.0  0.7 - 4.0 K/uL   Monocytes Relative 5  3 - 12 %   Monocytes Absolute 1.1 (*) 0.1 - 1.0 K/uL   Eosinophils Relative 2  0 - 5 %   Eosinophils Absolute 0.3  0.0 - 0.7 K/uL   Basophils Relative 0  0 - 1 %   Basophils Absolute 0.0  0.0 - 0.1 K/uL  CBC     Status: Abnormal   Collection Time    11/15/13  5:55 AM      Result Value Ref Range   WBC 16.7 (*) 4.0 - 10.5 K/uL   RBC 2.73 (*) 3.87 - 5.11 MIL/uL   Hemoglobin 8.1 (*) 12.0 - 15.0 g/dL   HCT 24.4 (*) 36.0 - 46.0 %   MCV 89.4  78.0 - 100.0 fL   MCH 29.7  26.0 - 34.0 pg   MCHC 33.2  30.0 - 36.0 g/dL   RDW 17.4 (*) 11.5 - 15.5 %   Platelets 207  150 - 400 K/uL  BASIC METABOLIC PANEL     Status: Abnormal   Collection Time    11/15/13  5:55 AM      Result Value Ref Range   Sodium 125 (*) 137 - 147 mEq/L   Potassium 4.5  3.7 - 5.3 mEq/L   Chloride 96  96 - 112 mEq/L   CO2 16 (*) 19 - 32 mEq/L   Glucose, Bld 94  70 - 99 mg/dL   BUN 56 (*) 6 - 23 mg/dL   Creatinine, Ser 2.91 (*) 0.50 - 1.10 mg/dL   Calcium 7.9 (*) 8.4 - 10.5 mg/dL   GFR calc non Af Amer 16 (*) >90 mL/min   GFR calc Af Amer 19 (*) >90 mL/min   Comment: (NOTE)     The eGFR has been calculated using the CKD EPI equation.     This calculation has not been validated in all clinical situations.     eGFR's persistently <90 mL/min signify possible Chronic Kidney     Disease.   Anion gap 13  5 - 15  MAGNESIUM     Status: None   Collection Time    11/15/13  5:55 AM      Result Value Ref Range   Magnesium 2.1  1.5 - 2.5 mg/dL  PHOSPHORUS     Status: None   Collection Time    11/15/13  5:55 AM      Result Value Ref Range   Phosphorus 4.6  2.3 - 4.6 mg/dL   '@BRIEFLABTABLE' (sdes,specrequest,cult,reptstatus)   ) Recent Results (from the past 720 hour(s))  MRSA PCR SCREENING     Status: Abnormal   Collection Time    11/09/13  1:26 PM      Result  Value Ref Range Status   MRSA by PCR POSITIVE (*) NEGATIVE Final   Comment:            The GeneXpert MRSA Assay (FDA     approved for NASAL specimens     only), is one component of a     comprehensive MRSA colonization     surveillance program. It is not     intended to diagnose MRSA     infection nor to guide or     monitor treatment for     MRSA infections.     RESULT CALLED TO, READ BACK BY AND VERIFIED WITH:     LISA FREI RN 11/09/2013 1928 BY BOVELL,T.  URINE CULTURE     Status: None   Collection Time    11/09/13  4:00  PM      Result Value Ref Range Status   Specimen Description URINE, RANDOM   Final   Special Requests Normal   Final   Culture  Setup Time     Final   Value: 11/09/2013 21:50     Performed at [facility]     Final   Value: >=100,000 COLONIES/ML     Performed at [facility]   Culture     Final   Value: KLEBSIELLA PNEUMONIAE     PROVIDENCIA STUARTII     PSEUDOMONAS AERUGINOSA     Note: SUSCEPTIBILITIES PERFORMED ON PREVIOUS CULTURE WITHIN THE LAST 5 DAYS.     Performed at [facility]   Report Status 11/15/2013 FINAL   Final  URINE CULTURE     Status: None   Collection Time    11/09/13  4:00 PM      Result Value Ref Range Status   Specimen Description URINE, CATHETERIZED   Final   Special Requests Normal   Final   Culture  Setup Time     Final   Value: 11/09/2013 21:52     Performed at [place]     Final   Value: >=100,000 COLONIES/ML     Performed at [facility]   Culture     Final   Value: KLEBSIELLA PNEUMONIAE     Note: ADDITIONAL TESTING IN PROGRESS     PROVIDENCIA STUARTII     PSEUDOMONAS AERUGINOSA     Performed at [facility]   Report Status PENDING   Incomplete   Organism ID, Bacteria KLEBSIELLA PNEUMONIAE   Final   Organism ID, Bacteria PSEUDOMONAS AERUGINOSA   Final   Organism ID, Bacteria PROVIDENCIA STUARTII   Final   Organism ID, Bacteria  PSEUDOMONAS AERUGINOSA   Final  URINE CULTURE     Status: None   Collection Time    11/09/13  4:00 PM      Result Value Ref Range Status   Specimen Description URINE, CATHETERIZED RIGHT   Final   Special Requests Normal   Final   Culture  Setup Time     Final   Value: 11/09/2013 21:51     Performed at [facility]     Final   Value: >=100,000 COLONIES/ML     Performed at [facility]   Culture     Final   Value: KLEBSIELLA PNEUMONIAE     PROVIDENCIA STUARTII     PSEUDOMONAS AERUGINOSA     Note: SUSCEPTIBILITIES PERFORMED ON PREVIOUS CULTURE WITHIN THE LAST 5 DAYS.     Performed at [facility]   Report Status 11/15/2013 FINAL   Final  CULTURE, BLOOD (ROUTINE X 2)     Status: None   Collection Time    11/09/13  4:20 PM      Result Value Ref Range Status   Specimen Description BLOOD LEFT FOOT   Final   Special Requests BOTTLES DRAWN AEROBIC ONLY 5CC   Final   Culture  Setup Time     Final   Value: 11/09/2013 22:55     Performed at [facility]   Culture     Final   Value: NO GROWTH 5 DAYS     Performed at [facility]   Report Status 11/15/2013 FINAL   Final  CULTURE, BLOOD (ROUTINE X 2)     Status: None  Collection Time    11/09/13  4:25 PM      Result Value Ref Range Status   Specimen Description BLOOD LEFT FOOT   Final   Special Requests BOTTLES DRAWN AEROBIC ONLY 3CC   Final   Culture  Setup Time     Final   Value: 11/09/2013 22:56     Performed at [facility]   Culture     Final   Value: NO GROWTH 5 DAYS     Performed at [facility]   Report Status 11/15/2013 FINAL   Final  BODY FLUID CULTURE     Status: None   Collection Time    11/09/13  6:48 PM      Result Value Ref Range Status   Specimen Description PLEURAL RIGHT   Final   Special Requests Normal   Final   Gram Stain     Final   Value: RARE WBC PRESENT, PREDOMINANTLY MONONUCLEAR     ABUNDANT GRAM NEGATIVE RODS     FEW GRAM POSITIVE  COCCI     IN PAIRS IN CLUSTERS Gram Stain Report Called to,Read Back By and Verified With: Gram Stain Report Called to,Read Back By and Verified With: ALLISON K AT 1300 ON 11/10/13/ BY WEBSP     Performed at [facility]   Culture     Final   Value: MULTIPLE ORGANISMS PRESENT, NONE PREDOMINANT     Note: NO STAPHYLOCOCCUS AUREUS ISOLATED NO GROUP A STREP (S.PYOGENES) ISOLATED     Performed at [facility]   Report Status 11/13/2013 FINAL   Final  BODY FLUID CULTURE     Status: None   Collection Time    11/09/13  6:48 PM      Result Value Ref Range Status   Specimen Description PLEURAL LEFT   Final   Special Requests Normal   Final   Gram Stain     Final   Value: NO WBC SEEN     RARE GRAM NEGATIVE RODS     Performed at [facility]   Culture     Final   Value: MODERATE PROVIDENCIA STUARTII     ABUNDANT KLEBSIELLA PNEUMONIAE     Note: Confirmed Extended Spectrum Beta-Lactamase Producer (ESBL) CRITICAL RESULT CALLED TO, READ BACK BY AND VERIFIED WITH: ANGELA ASHLEY 11/14/13 @ 10:34AM BY RUSCOE A. Confirmatory test for carbapenemase production is positive. COLISTIN=0.125ug/mL      ETEST results for this drug are "FOR INVESTIGATIONAL USE ONLY" and should NOT be used for clinical purposes. CRITICAL RESULT CALLED TO, READ BACK BY AND VERIFIED WITH: CELENIA SOSA 11/15/13 @ 10:17AM B RUSCOE A. CRITICAL RESULT CALLED TO, READ BACK BY      AND VERIFIED WITH: MELISSA MORGAN IN [facility] 11/15/13 @ 10:15AM BY RUSCOE A.     Performed at [facility]   Report Status 11/15/2013 FINAL   Final   Organism ID, Bacteria PROVIDENCIA STUARTII   Final   Organism ID, Bacteria KLEBSIELLA PNEUMONIAE   Final  CULTURE, RESPIRATORY (NON-EXPECTORATED)     Status: None   Collection Time    11/09/13  8:00 PM      Result Value Ref Range Status   Specimen Description TRACHEAL ASPIRATE   Final   Special Requests Normal   Final   Gram Stain     Final   Value: FEW WBC PRESENT,BOTH PMN  AND MONONUCLEAR     RARE SQUAMOUS EPITHELIAL CELLS PRESENT     RARE Lonell Grandchild  NEGATIVE RODS     RARE GRAM POSITIVE RODS     Performed at [facility]   Culture     Final   Value: Non-Pathogenic Oropharyngeal-type Flora Isolated.     Performed at [facility]   Report Status 11/13/2013 FINAL   Final     Impression/Recommendation  Active Problems:   Obstructive uropathy   Chronic respiratory failure, unspecified whether with hypoxia or hypercapnia   Chronic renal failure, stage 4 (severe)   Hypoalbuminemia due to protein-calorie malnutrition   Bilateral pleural effusion   HTN (hypertension)   Anemia of chronic disease   Tracheostomy status   Conjunctivitis, right eye   Empyema lung   Chriss Redel is a 63 y.o. female with  Severe neuromyelitis optica, hemiparesis, VDRF with tracheostomy, admittedd with obstructive uropathy sp left percutaneous nephrolithotomy  And suprapubic catheter placement with MDR GNR organims in pleural fluid cx (from chest tubing??) and from urine  #1 MDR klebsiella and Providencia in pleural fluid: Klebsiella is a CRE R to all abx except colisting with MIC =  0.125ug/mL, MDR providencia that is S to  Cetaz, cefepime, impenem, zosyn.   I am ASSUMING these cultures are meaningful  But I am concerned that they instead may represent colonization of the the chest tubes patient had in place when she came in  --I am starting AVYCAZ and flagyl  #2 CRE in urine with Providencia and Pseudomonas--latter two ARE sensitive to imipenem:  I am going now to ONLY be covering the CRE. Patient has already had 6 days of imipenem which would be active vs the latter two  Again as is the case with the organisms in the pleural fluid I have to ask the question if we are REPEATING THE SINS OF TREATING COLONIZERS RATHER THAN PATHOGENS WHICH IS CLEARLY HAPPENING OVER AND OVER AT [facility]  #3 Screening: will screen for HIV and hep panel  #4 Would  consider palliative care consult    11/15/2013, 6:41 PM   Thank you so much for this interesting consult  [place] for [place] [phone] (pager) [phone] (office) 11/15/2013, 6:41 PM  Rhina Br[place] 11/15/2013, 6:41 PM

## 2013-11-15 NOTE — Progress Notes (Signed)
ANTIBIOTIC CONSULT NOTE - FOLLOW UP  Pharmacy Consult for Primaxin Indication: UTI, HCAP, empyema  Allergies  Allergen Reactions  . Neurontin [Gabapentin] Other (See Comments)    Per MAR, unknown  . Xanax [Alprazolam] Other (See Comments)    Per MAR, unknown    Patient Measurements: Height: 5\' 1"  (154.9 cm) Weight: 154 lb 5.2 oz (70 kg) IBW/kg (Calculated) : 47.8  Vital Signs: Temp: 99.4 F (37.4 C) (10/11 0822) Temp Source: Oral (10/11 0822) BP: 171/82 mmHg (10/11 0844) Pulse Rate: 92 (10/11 0844) Intake/Output from previous day: 10/10 0701 - 10/11 0700 In: 640 [I.V.:440; IV Piggyback:200] Out: 545 [Urine:545]  Labs:  Recent Labs  11/13/13 0544 11/13/13 1122 11/14/13 0540 11/15/13 0555  WBC 21.9*  --  20.1* 16.7*  HGB 9.0*  --  8.9* 8.1*  PLT 181  --  177 207  LABCREA  --  64.7  --   --   CREATININE 2.73*  --  2.79* 2.91*   Estimated Creatinine Clearance: 17.5 ml/min (by C-G formula based on Cr of 2.91).  Recent Labs  11/12/13 1730  VANCOTROUGH 18.6     Microbiology: Recent Results (from the past 720 hour(s))  MRSA PCR SCREENING     Status: Abnormal   Collection Time    11/09/13  1:26 PM      Result Value Ref Range Status   MRSA by PCR POSITIVE (*) NEGATIVE Final   Comment:            The GeneXpert MRSA Assay (FDA     approved for NASAL specimens     only), is one component of a     comprehensive MRSA colonization     surveillance program. It is not     intended to diagnose MRSA     infection nor to guide or     monitor treatment for     MRSA infections.     RESULT CALLED TO, READ BACK BY AND VERIFIED WITH:     LISA FREI RN 11/09/2013 1928 BY BOVELL,T.  URINE CULTURE     Status: None   Collection Time    11/09/13  4:00 PM      Result Value Ref Range Status   Specimen Description URINE, RANDOM   Final   Special Requests Normal   Final   Culture  Setup Time     Final   Value: 11/09/2013 21:50     Performed at [facility]  Count     Final   Value: >=100,000 COLONIES/ML     Performed at [facility]   Culture     Final   Value: GRAM NEGATIVE RODS     Performed at [facility]   Report Status PENDING   Incomplete  URINE CULTURE     Status: None   Collection Time    11/09/13  4:00 PM      Result Value Ref Range Status   Specimen Description URINE, CATHETERIZED   Final   Special Requests Normal   Final   Culture  Setup Time     Final   Value: 11/09/2013 21:52     Performed at [facility] Count     Final   Value: >=100,000 COLONIES/ML     Performed at [facility]   Culture     Final   Value: GRAM NEGATIVE RODS     Performed at [facility]   Report Status PENDING  Incomplete  URINE CULTURE     Status: None   Collection Time    11/09/13  4:00 PM      Result Value Ref Range Status   Specimen Description URINE, CATHETERIZED RIGHT   Final   Special Requests Normal   Final   Culture  Setup Time     Final   Value: 11/09/2013 21:51     Performed at [facility]   Colony Count     Final   Value: >=100,000 COLONIES/ML     Performed at [facility]   Culture     Final   Value: GRAM NEGATIVE RODS     Performed at [facility]   Report Status PENDING   Incomplete  CULTURE, BLOOD (ROUTINE X 2)     Status: None   Collection Time    11/09/13  4:20 PM      Result Value Ref Range Status   Specimen Description BLOOD LEFT FOOT   Final   Special Requests BOTTLES DRAWN AEROBIC ONLY 5CC   Final   Culture  Setup Time     Final   Value: 11/09/2013 22:55     Performed at [facility]   Culture     Final   Value:        BLOOD CULTURE RECEIVED NO GROWTH TO DATE CULTURE WILL BE HELD FOR 5 DAYS BEFORE ISSUING A FINAL NEGATIVE REPORT     Performed at [facility]   Report Status PENDING   Incomplete  CULTURE, BLOOD (ROUTINE X 2)     Status: None   Collection Time    11/09/13  4:25 PM      Result Value Ref Range Status    Specimen Description BLOOD LEFT FOOT   Final   Special Requests BOTTLES DRAWN AEROBIC ONLY 3CC   Final   Culture  Setup Time     Final   Value: 11/09/2013 22:56     Performed at [facility]   Culture     Final   Value:        BLOOD CULTURE RECEIVED NO GROWTH TO DATE CULTURE WILL BE HELD FOR 5 DAYS BEFORE ISSUING A FINAL NEGATIVE REPORT     Performed at [facility]   Report Status PENDING   Incomplete  BODY FLUID CULTURE     Status: None   Collection Time    11/09/13  6:48 PM      Result Value Ref Range Status   Specimen Description PLEURAL RIGHT   Final   Special Requests Normal   Final   Gram Stain     Final   Value: RARE WBC PRESENT, PREDOMINANTLY MONONUCLEAR     ABUNDANT GRAM NEGATIVE RODS     FEW GRAM POSITIVE COCCI     IN PAIRS IN CLUSTERS Gram Stain Report Called to,Read Back By and Verified With: Gram Stain Report Called to,Read Back By and Verified With: ALLISON K AT 1300 ON 11/10/13/ BY WEBSP     Performed at [facility]   Culture     Final   Value: MULTIPLE ORGANISMS PRESENT, NONE PREDOMINANT     Note: NO STAPHYLOCOCCUS AUREUS ISOLATED NO GROUP A STREP (S.PYOGENES) ISOLATED     Performed at [facility]   Report Status 11/13/2013 FINAL   Final  BODY FLUID CULTURE     Status: None   Collection Time    11/09/13  6:48 PM  Result Value Ref Range Status   Specimen Description PLEURAL LEFT   Final   Special Requests Normal   Final   Gram Stain     Final   Value: NO WBC SEEN     RARE GRAM NEGATIVE RODS     [facility]   Culture     Final   Value: MODERATE GRAM NEGATIVE RODS     KLEBSIELLA PNEUMONIAE     Note: Confirmed Extended Spectrum Beta-Lactamase Producer (ESBL) CRITICAL RESULT CALLED TO, READ BACK BY AND VERIFIED WITH: ANGELA ASHLEY 11/14/13 @ 10:34AM BY RUSCOE A.     [facility]   Report Status PENDING   Incomplete  CULTURE, RESPIRATORY (NON-EXPECTORATED)     Status: None    Collection Time    11/09/13  8:00 PM      Result Value Ref Range Status   Specimen Description TRACHEAL ASPIRATE   Final   Special Requests Normal   Final   Gram Stain     Final   Value: FEW WBC PRESENT,BOTH PMN AND MONONUCLEAR     RARE SQUAMOUS EPITHELIAL CELLS PRESENT     RARE GRAM NEGATIVE RODS     RARE GRAM POSITIVE RODS     [facility]   Culture     Final   Value: Non-Pathogenic Oropharyngeal-type Flora Isolated.     [facility] at [facility]   Report Status 11/13/2013 FINAL   Final    Anti-infectives: 10/5 >> vancomycin >> 10/9 10/5 >> primaxin >>   Assessment: 63 [facility] follow up of percutaneous nephrostomy tubes with nephrolithotomy w/ suprapubic tube placement 10/6. Has lived at [facility] for past 4 years. Previous admit in Aug for ARF in setting of obstructive uropathy and urosepsis, s/p bilateral urostomy tubes placement.  She is now found to have GNR in urine cultures and ESBL Klebsiella in bilateral empyema.  Pharmacy is consulted to dose Primaxin.   Today, 10/11  Tmax: 99.4  WBCs: elevated but improving, 16.7   Renal: SCr increased to 2.91, CrCl ~ 17 ml/min   Goal of Therapy:  Appropriate abx dosing, eradication of infection.   Plan:   Continue Primaxin 250 mg IV q12h  Follow up renal function and cultures as available.  Lynann Beaverhristine Peighton Edgin PharmD, BCPS Pager [phone] 11/15/2013 9:23 AM

## 2013-11-16 ENCOUNTER — Inpatient Hospital Stay (HOSPITAL_COMMUNITY): Payer: Medicare Other

## 2013-11-16 ENCOUNTER — Other Ambulatory Visit: Payer: Self-pay | Admitting: Urology

## 2013-11-16 ENCOUNTER — Inpatient Hospital Stay: Admission: RE | Admit: 2013-11-16 | Payer: Medicare Other | Source: Ambulatory Visit | Admitting: Urology

## 2013-11-16 DIAGNOSIS — R609 Edema, unspecified: Secondary | ICD-10-CM

## 2013-11-16 LAB — CBC
HCT: 24.5 % — ABNORMAL LOW (ref 36.0–46.0)
Hemoglobin: 8.1 g/dL — ABNORMAL LOW (ref 12.0–15.0)
MCH: 29.8 pg (ref 26.0–34.0)
MCHC: 33.1 g/dL (ref 30.0–36.0)
MCV: 90.1 fL (ref 78.0–100.0)
Platelets: 194 K/uL (ref 150–400)
RBC: 2.72 MIL/uL — ABNORMAL LOW (ref 3.87–5.11)
RDW: 17.2 % — ABNORMAL HIGH (ref 11.5–15.5)
WBC: 14.3 K/uL — ABNORMAL HIGH (ref 4.0–10.5)

## 2013-11-16 LAB — BASIC METABOLIC PANEL
Anion gap: 15 (ref 5–15)
BUN: 61 mg/dL — AB (ref 6–23)
CHLORIDE: 97 meq/L (ref 96–112)
CO2: 16 meq/L — AB (ref 19–32)
Calcium: 7.7 mg/dL — ABNORMAL LOW (ref 8.4–10.5)
Creatinine, Ser: 2.79 mg/dL — ABNORMAL HIGH (ref 0.50–1.10)
GFR calc Af Amer: 20 mL/min — ABNORMAL LOW (ref >90)
GFR calc non Af Amer: 17 mL/min — ABNORMAL LOW (ref >90)
GLUCOSE: 142 mg/dL — AB (ref 70–99)
POTASSIUM: 4.7 meq/L (ref 3.7–5.3)
Sodium: 128 mEq/L — ABNORMAL LOW (ref 137–147)

## 2013-11-16 LAB — BASIC METABOLIC PANEL WITH GFR
Anion gap: 14 (ref 5–15)
BUN: 59 mg/dL — ABNORMAL HIGH (ref 6–23)
CO2: 17 meq/L — ABNORMAL LOW (ref 19–32)
Calcium: 8.2 mg/dL — ABNORMAL LOW (ref 8.4–10.5)
Chloride: 96 meq/L (ref 96–112)
Creatinine, Ser: 2.96 mg/dL — ABNORMAL HIGH (ref 0.50–1.10)
GFR calc Af Amer: 18 mL/min — ABNORMAL LOW (ref >90)
GFR calc non Af Amer: 16 mL/min — ABNORMAL LOW (ref >90)
Glucose, Bld: 97 mg/dL (ref 70–99)
Potassium: 4.7 meq/L (ref 3.7–5.3)
Sodium: 127 meq/L — ABNORMAL LOW (ref 137–147)

## 2013-11-16 LAB — PHOSPHORUS: Phosphorus: 4.8 mg/dL — ABNORMAL HIGH (ref 2.3–4.6)

## 2013-11-16 LAB — URINE CULTURE
Colony Count: >=100000
Special Requests: NORMAL

## 2013-11-16 LAB — HEPATITIS PANEL, ACUTE
HCV Ab: NEGATIVE
Hep A IgM: NONREACTIVE
Hep B C IgM: NONREACTIVE
Hepatitis B Surface Ag: NEGATIVE

## 2013-11-16 LAB — HIV ANTIBODY (ROUTINE TESTING W REFLEX): HIV 1&2 Ab, 4th Generation: NONREACTIVE

## 2013-11-16 LAB — MAGNESIUM: Magnesium: 2.2 mg/dL (ref 1.5–2.5)

## 2013-11-16 MED ORDER — FUROSEMIDE 10 MG/ML IJ SOLN
8.0000 mg/h | INTRAVENOUS | Status: DC
  Administered 2013-11-16: 8 mg/h via INTRAVENOUS
  Filled 2013-11-16: qty 25

## 2013-11-16 NOTE — Progress Notes (Signed)
CSW continuing to follow as pt admitted from [facility].  CSW spoke with admissions coordinator at [facility], Ceasar Lund who was aware of pt admission and states that pt able to return to SNF upon discharge as long as bed available. Per [facility] admissions coordinator, CSW can keep admissions at [facility] updated and contact facility when pt medically ready.  CSW to continue to follow.  Loletta Specter, MSW, LCSW Clinical Social Work [phone]

## 2013-11-16 NOTE — Progress Notes (Signed)
[facility]  Day # 8 abx  Day # 2   Ceftaz/avi "" Flagyl  Subjective:    Antibiotics:  Anti-infectives   Start     Dose/Rate Route Frequency Ordered Stop   11/15/13 1500  ceftazidime-avibactam (AVYCAZ) 0.94 g in dextrose 5 % 50 mL IVPB     0.94 g 25 mL/hr over 2 Hours Intravenous Every 12 hours 11/15/13 1427     11/15/13 1300  metroNIDAZOLE (FLAGYL) IVPB 500 mg     500 mg 100 mL/hr over 60 Minutes Intravenous Every 8 hours 11/15/13 1259     11/14/13 1800  vancomycin (VANCOCIN) IVPB 750 mg/150 ml premix  Status:  Discontinued     750 mg 150 mL/hr over 60 Minutes Intravenous Every 48 hours 11/12/13 2007 11/13/13 0938   11/10/13 1800  vancomycin (VANCOCIN) 500 mg in sodium chloride 0.9 % 100 mL IVPB  Status:  Discontinued     500 mg 100 mL/hr over 60 Minutes Intravenous Every 24 hours 11/09/13 1930 11/12/13 2007   11/10/13 0600  ciprofloxacin (CIPRO) IVPB 400 mg  Status:  Discontinued     400 mg 200 mL/hr over 60 Minutes Intravenous 60 min pre-op 11/09/13 1403 11/09/13 1521   11/10/13 0400  imipenem-cilastatin (PRIMAXIN) 250 mg in sodium chloride 0.9 % 100 mL IVPB  Status:  Discontinued     250 mg 200 mL/hr over 30 Minutes Intravenous Every 12 hours 11/09/13 1930 11/15/13 1259   11/09/13 1700  vancomycin (VANCOCIN) IVPB 1000 mg/200 mL premix     1,000 mg 200 mL/hr over 60 Minutes Intravenous  Once 11/09/13 1617 11/09/13 1822   11/09/13 1600  imipenem-cilastatin (PRIMAXIN) 500 mg in sodium chloride 0.9 % 100 mL IVPB     500 mg 200 mL/hr over 30 Minutes Intravenous  Once 11/09/13 1524 11/09/13 1623      Medications: Scheduled Meds: . antiseptic oral rinse  7 mL Mouth Rinse QID  . calcium acetate  1,334 mg Oral TID WC  . carbamazepine  100 mg Oral BID  . ceftazidime avibactam (AVYCAZ) IVPB  0.94 g Intravenous Q12H  . chlorhexidine  15 mL Mouth Rinse BID  . ciprofloxacin  2 drop Right Eye QID  . cloNIDine  0.3 mg Oral TID  . darbepoetin  (ARANESP) injection - NON-DIALYSIS  40 mcg Subcutaneous Q Sat-1800  . docusate  100 mg Per Tube BID  . doxazosin  4 mg Oral Q12H  . famotidine  20 mg Oral Daily  . feeding supplement (ENSURE)  1 Container Oral TID BM  . feeding supplement (PRO-STAT SUGAR FREE 64)  30 mL Oral QPC supper  . ferrous sulfate  325 mg Oral BID WC  . hydrALAZINE  100 mg Oral 4 times per day  . isosorbide dinitrate  20 mg Oral TID  . labetalol  600 mg Oral BID  . levETIRAcetam  500 mg Oral BID  . metronidazole  500 mg Intravenous Q8H  . multivitamin with minerals  1 tablet Oral Daily  . pantoprazole sodium  40 mg Per Tube Daily  . pregabalin  100 mg Oral Daily  . tiZANidine  2 mg Oral TID   Continuous Infusions: . dextrose 5 % and 0.45% NaCl 10 mL/hr at 11/12/13 1800  . furosemide (LASIX) infusion 8 mg/hr (11/16/13 1057)   PRN Meds:.sodium chloride, acetaminophen, fentaNYL, fentaNYL, hydrALAZINE, oxyCODONE    Objective: Weight change:   Intake/Output Summary (Last 24 hours) at 11/16/13 1507 Last data filed at  11/16/13 1440  Gross per 24 hour  Intake  913.4 ml  Output    865 ml  Net   48.4 ml   Blood pressure 157/77, pulse 68, temperature 97.9 F (36.6 C), temperature source Oral, resp. rate 20, height 5\' 1"  (1.549 m), weight 160 lb 0.9 oz (72.6 kg), SpO2 100.00%. Temp:  [97.9 F (36.6 C)-99.4 F (37.4 C)] 97.9 F (36.6 C) (10/12 1500) Pulse Rate:  [68-89] 68 (10/12 1400) Resp:  [20-39] 20 (10/12 1155) BP: (122-176)/(59-82) 157/77 mmHg (10/12 1400) SpO2:  [100 %] 100 % (10/12 1400) FiO2 (%):  [30 %] 30 % (10/12 1155) Weight:  [160 lb 0.9 oz (72.6 kg)] 160 lb 0.9 oz (72.6 kg) (10/12 0400)  Physical Exam: General: opens eyes to name  CV: RRR tachycardic  Pulm: fairly ctab,  GI: soft nondistended  MSK: contractures  Nephrostomy tube with blood tinged urine, suprapubic catheter is clean.  Neurology: contractures moves eyes around when spoken to  CBC:  Recent Labs Lab 11/12/13 0550  11/13/13 0544 11/14/13 0540 11/15/13 0555 11/16/13 0325  HGB 9.2* 9.0* 8.9* 8.1* 8.1*  HCT 27.6* 27.2* 26.3* 24.4* 24.5*  PLT 171 181 177 207 194     BMET  Recent Labs  11/15/13 0555 11/16/13 0325  NA 125* 127*  K 4.5 4.7  CL 96 96  CO2 16* 17*  GLUCOSE 94 97  BUN 56* 59*  CREATININE 2.91* 2.96*  CALCIUM 7.9* 8.2*     Liver Panel  No results found for this basename: PROT, ALBUMIN, AST, ALT, ALKPHOS, BILITOT, BILIDIR, IBILI,  in the last 72 hours     Sedimentation Rate No results found for this basename: ESRSEDRATE,  in the last 72 hours C-Reactive Protein No results found for this basename: CRP,  in the last 72 hours  Micro Results: Recent Results (from the past 720 hour(s))  MRSA PCR SCREENING     Status: Abnormal   Collection Time    11/09/13  1:26 PM      Result Value Ref Range Status   MRSA by PCR POSITIVE (*) NEGATIVE Final   Comment:            The GeneXpert MRSA Assay (FDA     approved for NASAL specimens     only), is one component of a     comprehensive MRSA colonization     surveillance program. It is not     intended to diagnose MRSA     infection nor to guide or     monitor treatment for     MRSA infections.     RESULT CALLED TO, READ BACK BY AND VERIFIED WITH:     LISA FREI RN 11/09/2013 1928 BY BOVELL,T.  URINE CULTURE     Status: None   Collection Time    11/09/13  4:00 PM      Result Value Ref Range Status   Specimen Description URINE, RANDOM   Final   Special Requests Normal   Final   Culture  Setup Time     Final   Value: 11/09/2013 21:50     Performed at [facility] Count     Final   Value: >=100,000 COLONIES/ML     Performed at [facility]   Culture     Final   Value: KLEBSIELLA PNEUMONIAE     PROVIDENCIA STUARTII     PSEUDOMONAS AERUGINOSA     Note: SUSCEPTIBILITIES PERFORMED ON PREVIOUS CULTURE WITHIN THE  LAST 5 DAYS.     Performed at [facility]   Report Status 11/15/2013 FINAL    Final  URINE CULTURE     Status: None   Collection Time    11/09/13  4:00 PM      Result Value Ref Range Status   Specimen Description URINE, CATHETERIZED   Final   Special Requests Normal   Final   Culture  Setup Time     Final   Value: 11/09/2013 21:52     Performed at [facility] Count     Final   Value: >=100,000 COLONIES/ML     Performed at [facility]   Culture     Final   Value: KLEBSIELLA PNEUMONIAE     Note: Confirmatory test for carbapenemase production is positive. CRITICAL RESULT CALLED TO, READ BACK BY AND VERIFIED WITH: JASON @8am  10.12.15 BY OANA COLISTIN 0.125ug/mL ETEST results for this drug are "FOR INVESTIGATIONAL USE ONLY" and should NOT be      used for clinical purposes.     PROVIDENCIA STUARTII     PSEUDOMONAS AERUGINOSA     Performed at [facility]   Report Status 11/16/2013 FINAL   Final   Organism ID, Bacteria KLEBSIELLA PNEUMONIAE   Final   Organism ID, Bacteria PSEUDOMONAS AERUGINOSA   Final   Organism ID, Bacteria PROVIDENCIA STUARTII   Final   Organism ID, Bacteria PSEUDOMONAS AERUGINOSA   Final  URINE CULTURE     Status: None   Collection Time    11/09/13  4:00 PM      Result Value Ref Range Status   Specimen Description URINE, CATHETERIZED RIGHT   Final   Special Requests Normal   Final   Culture  Setup Time     Final   Value: 11/09/2013 21:51     Performed at [facility] Count     Final   Value: >=100,000 COLONIES/ML     Performed at [facility]   Culture     Final   Value: KLEBSIELLA PNEUMONIAE     PROVIDENCIA STUARTII     PSEUDOMONAS AERUGINOSA     Note: SUSCEPTIBILITIES PERFORMED ON PREVIOUS CULTURE WITHIN THE LAST 5 DAYS.     Performed at [facility]   Report Status 11/15/2013 FINAL   Final  CULTURE, BLOOD (ROUTINE X 2)     Status: None   Collection Time    11/09/13  4:20 PM      Result Value Ref Range Status   Specimen Description BLOOD LEFT FOOT   Final    Special Requests BOTTLES DRAWN AEROBIC ONLY 5CC   Final   Culture  Setup Time     Final   Value: 11/09/2013 22:55     Performed at [facility]   Culture     Final   Value: NO GROWTH 5 DAYS     Performed at [facility]   Report Status 11/15/2013 FINAL   Final  CULTURE, BLOOD (ROUTINE X 2)     Status: None   Collection Time    11/09/13  4:25 PM      Result Value Ref Range Status   Specimen Description BLOOD LEFT FOOT   Final   Special Requests BOTTLES DRAWN AEROBIC ONLY 3CC   Final   Culture  Setup Time     Final   Value: 11/09/2013 22:56     Performed at [facility]  Culture     Final   Value: NO GROWTH 5 DAYS     Performed at [facility]   Report Status 11/15/2013 FINAL   Final  BODY FLUID CULTURE     Status: None   Collection Time    11/09/13  6:48 PM      Result Value Ref Range Status   Specimen Description PLEURAL RIGHT   Final   Special Requests Normal   Final   Gram Stain     Final   Value: RARE WBC PRESENT, PREDOMINANTLY MONONUCLEAR     ABUNDANT GRAM NEGATIVE RODS     FEW GRAM POSITIVE COCCI     IN PAIRS IN CLUSTERS Gram Stain Report Called to,Read Back By and Verified With: Gram Stain Report Called to,Read Back By and Verified With: ALLISON K AT 1300 ON 11/10/13/ BY WEBSP     Performed at [facility]   Culture     Final   Value: MULTIPLE ORGANISMS PRESENT, NONE PREDOMINANT     Note: NO STAPHYLOCOCCUS AUREUS ISOLATED NO GROUP A STREP (S.PYOGENES) ISOLATED     Performed at [facility]   Report Status 11/13/2013 FINAL   Final  BODY FLUID CULTURE     Status: None   Collection Time    11/09/13  6:48 PM      Result Value Ref Range Status   Specimen Description PLEURAL LEFT   Final   Special Requests Normal   Final   Gram Stain     Final   Value: NO WBC SEEN     RARE GRAM NEGATIVE RODS     Performed at [facility]   Culture     Final   Value: MODERATE PROVIDENCIA STUARTII     ABUNDANT KLEBSIELLA  PNEUMONIAE     Note: Confirmed Extended Spectrum Beta-Lactamase Producer (ESBL) CRITICAL RESULT CALLED TO, READ BACK BY AND VERIFIED WITH: ANGELA ASHLEY 11/14/13 @ 10:34AM BY RUSCOE A. Confirmatory test for carbapenemase production is positive. COLISTIN=0.125ug/mL      ETEST results for this drug are "FOR INVESTIGATIONAL USE ONLY" and should NOT be used for clinical purposes. CRITICAL RESULT CALLED TO, READ BACK BY AND VERIFIED WITH: CELENIA SOSA 11/15/13 @ 10:17AM B RUSCOE A. CRITICAL RESULT CALLED TO, READ BACK BY      AND VERIFIED WITH: MELISSA MORGAN IN [facility] 11/15/13 @ 10:15AM BY RUSCOE A.     Performed at [facility]   Report Status 11/15/2013 FINAL   Final   Organism ID, Bacteria PROVIDENCIA STUARTII   Final   Organism ID, Bacteria KLEBSIELLA PNEUMONIAE   Final  CULTURE, RESPIRATORY (NON-EXPECTORATED)     Status: None   Collection Time    11/09/13  8:00 PM      Result Value Ref Range Status   Specimen Description TRACHEAL ASPIRATE   Final   Special Requests Normal   Final   Gram Stain     Final   Value: FEW WBC PRESENT,BOTH PMN AND MONONUCLEAR     RARE SQUAMOUS EPITHELIAL CELLS PRESENT     RARE GRAM NEGATIVE RODS     RARE GRAM POSITIVE RODS     Performed at [facility]   Culture     Final   Value: Non-Pathogenic Oropharyngeal-type Flora Isolated.     Performed at [facility]   Report Status 11/13/2013 FINAL   Final  STONE ANALYSIS     Status: None   Collection Time    11/10/13  10:59 AM      Result Value Ref Range Status   Nidus Not observed   Final   Component 1 KSTONE Carbonate Apatite ([place]) 100%   Final   Stone Weight KSTONE 0.0430   Final   Comment: [facility]    Studies/Results: Dg Chest Port 1 View  11/16/2013   CLINICAL DATA:  63 year old female with chronic respiratory failure status post tracheostomy. Chronic renal failure. Bilateral pleural effusions. Initial encounter.  EXAM: PORTABLE CHEST - 1 VIEW   COMPARISON:  11/15/2013 and earlier.  FINDINGS: The tracheostomy tube now has a horizontal configuration and may be malpositioned.  Stable right PICC line.  Stable cardiac size and mediastinal contours. Continued somewhat low lung volumes. Bilateral veiling opacity at the lung bases with superimposed diffuse reticulonodular opacity in both lungs. No pneumothorax.  IMPRESSION: 1. Tracheostomy tube now has a horizontal configuration suspicious for malpositioning. 2. Stable ventilation with small bilateral pleural effusions superimposed on diffuse reticulonodular density which could be bilateral infection or interstitial edema. These results will be called to the ordering clinician or representative by the Radiologist Assistant, and communication documented in the PACS or zVision Dashboard.   Electronically Signed   By: Augusto GambleLee  Hall M.D.   On: 11/16/2013 07:30   Dg Chest Port 1 View  11/15/2013   CLINICAL DATA:  Respiratory failure; cardiomyopathy  EXAM: PORTABLE CHEST - 1 VIEW  COMPARISON:  November 14, 2013  FINDINGS: Tracheostomy tube tip is 5.3 cm above the carina. Central catheter tip is in the superior vena cava near the cavoatrial junction. No pneumothorax. There is mild cardiomegaly with pulmonary vascular within normal limits. There is extensive interstitial edema. There are small effusions bilaterally. There is patchy consolidation in the medial left base. No new opacity.  IMPRESSION: Tube and catheter positions as described without pneumothorax. Evidence of congestive heart failure. Persistent consolidation medial left base. Lungs and cardiac silhouette appear unchanged compared to 1 day prior.   Electronically Signed   By: Bretta BangWilliam  Woodruff M.D.   On: 11/15/2013 08:19      Assessment/Plan:  Active Problems:   Obstructive uropathy   Chronic respiratory failure, unspecified whether with hypoxia or hypercapnia   Chronic renal failure, stage 4 (severe)   Hypoalbuminemia due to protein-calorie  malnutrition   Bilateral pleural effusion   HTN (hypertension)   Anemia of chronic disease   Tracheostomy status   Conjunctivitis, right eye   Empyema lung    [name] LegatoSandra [name] is a 63 y.o. female with  Severe neuromyelitis optica, hemiparesis, VDRF with tracheostomy, admittedd with obstructive uropathy sp left percutaneous nephrolithotomy  And suprapubic catheter placement with MDR GNR organims in pleural fluid cx (from chest tubing??) and from urine    #1 MDR klebsiella and Providencia in pleural fluid:  Klebsiella is a CRE R to all abx except colisting with MIC =  0.125ug/mL, MDR providencia that is S to Cetaz, cefepime, impenem, zosyn.   I am ASSUMING these cultures are meaningful But I am concerned that they instead may represent colonization of the the chest tubes patient had in place when she came in   --continue AVYCAZ and flagyl for total of 14 days then stop --[facility] --ask micro to add tygacil disk to CRE   #2 CRE in urine with Providencia and Pseudomonas--latter two ARE sensitive to imipenem --will be treating her for an empyema with above AVYCAZ and flagyl with coverage  of CRE in urine as well though I am skeptical it is a pathogen  I will sign off for now   Please call with further questions.    LOS: 7 days   Acey Lav 11/16/2013, 3:07 PM

## 2013-11-16 NOTE — Progress Notes (Addendum)
CRITICAL VALUE ALERT  Critical value received:  Chest XRay: suspicious malpositioning of trach tube   Date of notification:  11/16/13    Time of notification:  0800  Critical value read back: Yes   Nurse who received alert:  J.Thaddeus Evitts  MD notified (1st page):  Dr. Craige Cotta  Time of first page:  0805  MD notified (2nd page):n/a  Time of second page: n/a  Responding MD:  Dr. Craige Cotta    Time MD responded:  [phone]

## 2013-11-16 NOTE — Progress Notes (Signed)
Patient ID: [name], female   DOB: 09-22-1950, 63 y.o.   MRN: [id number] 6 Days Post-Op  Subjective: Maelea is to have a second look PCNL with possible ureteroscopy tomorrow for residual renal and left ureteral stones.   Her Cr is stable at 2.96 and her Hgb is stable at 8.1.  ROS:  Review of Systems  Unable to perform ROS: intubated    Anti-infectives: Anti-infectives   Start     Dose/Rate Route Frequency Ordered Stop   11/15/13 1500  ceftazidime-avibactam (AVYCAZ) 0.94 g in dextrose 5 % 50 mL IVPB     0.94 g 25 mL/hr over 2 Hours Intravenous Every 12 hours 11/15/13 1427     11/15/13 1300  metroNIDAZOLE (FLAGYL) IVPB 500 mg     500 mg 100 mL/hr over 60 Minutes Intravenous Every 8 hours 11/15/13 1259     11/14/13 1800  vancomycin (VANCOCIN) IVPB 750 mg/150 ml premix  Status:  Discontinued     750 mg 150 mL/hr over 60 Minutes Intravenous Every 48 hours 11/12/13 2007 11/13/13 0938   11/10/13 1800  vancomycin (VANCOCIN) 500 mg in sodium chloride 0.9 % 100 mL IVPB  Status:  Discontinued     500 mg 100 mL/hr over 60 Minutes Intravenous Every 24 hours 11/09/13 1930 11/12/13 2007   11/10/13 0600  ciprofloxacin (CIPRO) IVPB 400 mg  Status:  Discontinued     400 mg 200 mL/hr over 60 Minutes Intravenous 60 min pre-op 11/09/13 1403 11/09/13 1521   11/10/13 0400  imipenem-cilastatin (PRIMAXIN) 250 mg in sodium chloride 0.9 % 100 mL IVPB  Status:  Discontinued     250 mg 200 mL/hr over 30 Minutes Intravenous Every 12 hours 11/09/13 1930 11/15/13 1259   11/09/13 1700  vancomycin (VANCOCIN) IVPB 1000 mg/200 mL premix     1,000 mg 200 mL/hr over 60 Minutes Intravenous  Once 11/09/13 1617 11/09/13 1822   11/09/13 1600  imipenem-cilastatin (PRIMAXIN) 500 mg in sodium chloride 0.9 % 100 mL IVPB     500 mg 200 mL/hr over 30 Minutes Intravenous  Once 11/09/13 1524 11/09/13 1623      Current Facility-Administered Medications  Medication Dose Route Frequency Provider Last Rate Last Dose  .  0.9 %  sodium chloride infusion  250 mL Intravenous PRN Simonne Martinet, NP 10 mL/hr at 11/16/13 1522 250 mL at 11/16/13 1522  . acetaminophen (TYLENOL) tablet 650 mg  650 mg Oral Q6H PRN Alyson Reedy, MD   650 mg at 11/13/13 0000  . antiseptic oral rinse (CPC / CETYLPYRIDINIUM CHLORIDE 0.05%) solution 7 mL  7 mL Mouth Rinse QID Simonne Martinet, NP   7 mL at 11/16/13 1206  . calcium acetate (PHOSLO) capsule 1,334 mg  1,334 mg Oral TID WC Heloise Purpura, MD   1,334 mg at 11/16/13 1202  . carbamazepine (TEGRETOL) chewable tablet 100 mg  100 mg Oral BID Heloise Purpura, MD   100 mg at 11/16/13 1045  . ceftazidime-avibactam (AVYCAZ) 0.94 g in dextrose 5 % 50 mL IVPB  0.94 g Intravenous Q12H Christine E Shade, RPH   0.94 g at 11/16/13 1523  . chlorhexidine (PERIDEX) 0.12 % solution 15 mL  15 mL Mouth Rinse BID Simonne Martinet, NP   15 mL at 11/16/13 0837  . ciprofloxacin (CILOXAN) 0.3 % ophthalmic solution 2 drop  2 drop Right Eye QID Simonne Martinet, NP   2 drop at 11/16/13 1457  . cloNIDine (CATAPRES) tablet 0.3 mg  0.3 mg Oral  TID Heloise PurpuraLester Borden, MD   0.3 mg at 11/16/13 1045  . darbepoetin (ARANESP) injection 40 mcg  40 mcg Subcutaneous Q Sat-1800 Heloise PurpuraLester Borden, MD   40 mcg at 11/14/13 1851  . dextrose 5 %-0.45 % sodium chloride infusion   Intravenous Continuous Simonne MartinetPeter E Babcock, NP 10 mL/hr at 11/12/13 1800    . docusate (COLACE) 50 MG/5ML liquid 100 mg  100 mg Per Tube BID Otho Bellowserri L Green, RPH   100 mg at 11/16/13 1044  . doxazosin (CARDURA) tablet 4 mg  4 mg Oral Q12H Heloise PurpuraLester Borden, MD   4 mg at 11/16/13 1045  . famotidine (PEPCID) tablet 20 mg  20 mg Oral Daily Heloise PurpuraLester Borden, MD   20 mg at 11/16/13 1046  . feeding supplement (ENSURE) (ENSURE) pudding 1 Container  1 Container Oral TID BM Tenny CrawHeather S Winkler, RD   1 Container at 11/16/13 1457  . feeding supplement (PRO-STAT SUGAR FREE 64) liquid 30 mL  30 mL Oral QPC supper Heloise PurpuraLester Borden, MD   30 mL at 11/15/13 1732  . fentaNYL (SUBLIMAZE) injection 100 mcg   100 mcg Intravenous Q15 min PRN Simonne MartinetPeter E Babcock, NP   100 mcg at 11/10/13 1504  . fentaNYL (SUBLIMAZE) injection 100 mcg  100 mcg Intravenous Q2H PRN Simonne MartinetPeter E Babcock, NP   100 mcg at 11/16/13 0309  . ferrous sulfate tablet 325 mg  325 mg Oral BID WC Heloise PurpuraLester Borden, MD   325 mg at 11/16/13 0837  . furosemide (LASIX) 250 mg in dextrose 5 % 250 mL (1 mg/mL) infusion  8 mg/hr Intravenous Continuous Simonne MartinetPeter E Babcock, NP 8 mL/hr at 11/16/13 1057 8 mg/hr at 11/16/13 1057  . hydrALAZINE (APRESOLINE) injection 10 mg  10 mg Intravenous Q4H PRN Simonne MartinetPeter E Babcock, NP   10 mg at 11/11/13 1104  . hydrALAZINE (APRESOLINE) tablet 100 mg  100 mg Oral 4 times per day Simonne MartinetPeter E Babcock, NP   100 mg at 11/16/13 1207  . isosorbide dinitrate (ISORDIL) tablet 20 mg  20 mg Oral TID Heloise PurpuraLester Borden, MD   20 mg at 11/16/13 1046  . labetalol (NORMODYNE) tablet 600 mg  600 mg Oral BID Heloise PurpuraLester Borden, MD   600 mg at 11/16/13 1045  . levETIRAcetam (KEPPRA) 100 MG/ML solution 500 mg  500 mg Oral BID Dannielle Huhustin George Zeigler, RPH   500 mg at 11/16/13 1044  . metroNIDAZOLE (FLAGYL) IVPB 500 mg  500 mg Intravenous Q8H Randall Hissornelius N Van Dam, MD   500 mg at 11/16/13 1242  . multivitamin with minerals tablet 1 tablet  1 tablet Oral Daily Heloise PurpuraLester Borden, MD   1 tablet at 11/16/13 1045  . oxyCODONE (Oxy IR/ROXICODONE) immediate release tablet 5 mg  5 mg Oral Q4H PRN Heloise PurpuraLester Borden, MD   5 mg at 11/16/13 1106  . pantoprazole sodium (PROTONIX) 40 mg/20 mL oral suspension 40 mg  40 mg Per Tube Daily Otho Bellowserri L Green, RPH   40 mg at 11/16/13 1030  . pregabalin (LYRICA) capsule 100 mg  100 mg Oral Daily Heloise PurpuraLester Borden, MD   100 mg at 11/16/13 1045  . tiZANidine (ZANAFLEX) tablet 2 mg  2 mg Oral TID Heloise PurpuraLester Borden, MD   2 mg at 11/16/13 1045     Objective: Vital signs in last 24 hours: Temp:  [97.9 F (36.6 C)-98.9 F (37.2 C)] 97.9 F (36.6 C) (10/12 1500) Pulse Rate:  [68-89] 77 (10/12 1539) Resp:  [20-39] 20 (10/12 1539) BP: (122-167)/(59-82) 157/77  mmHg (10/12 1539)  SpO2:  [100 %] 100 % (10/12 1539) FiO2 (%):  [30 %] 30 % (10/12 1539) Weight:  [72.6 kg (160 lb 0.9 oz)] 72.6 kg (160 lb 0.9 oz) (10/12 0400)  Intake/Output from previous day: 10/11 0701 - 10/12 0700 In: 1135 [P.O.:390; I.V.:345; IV Piggyback:400] Out: 895 [Urine:895] Intake/Output this shift: Total I/O In: 428.4 [P.O.:360; I.V.:68.4] Out: 185 [Urine:185]   Physical Exam  Lab Results:   Recent Labs  11/15/13 0555 11/16/13 0325  WBC 16.7* 14.3*  HGB 8.1* 8.1*  HCT 24.4* 24.5*  PLT 207 194   BMET  Recent Labs  11/15/13 0555 11/16/13 0325  NA 125* 127*  K 4.5 4.7  CL 96 96  CO2 16* 17*  GLUCOSE 94 97  BUN 56* 59*  CREATININE 2.91* 2.96*  CALCIUM 7.9* 8.2*   PT/INR No results found for this basename: LABPROT, INR,  in the last 72 hours ABG No results found for this basename: PHART, PCO2, PO2, HCO3,  in the last 72 hours  Studies/Results: Dg Chest Port 1 View  11/16/2013   CLINICAL DATA:  63 year old female with chronic respiratory failure status post tracheostomy. Chronic renal failure. Bilateral pleural effusions. Initial encounter.  EXAM: PORTABLE CHEST - 1 VIEW  COMPARISON:  11/15/2013 and earlier.  FINDINGS: The tracheostomy tube now has a horizontal configuration and may be malpositioned.  Stable right PICC line.  Stable cardiac size and mediastinal contours. Continued somewhat low lung volumes. Bilateral veiling opacity at the lung bases with superimposed diffuse reticulonodular opacity in both lungs. No pneumothorax.  IMPRESSION: 1. Tracheostomy tube now has a horizontal configuration suspicious for malpositioning. 2. Stable ventilation with small bilateral pleural effusions superimposed on diffuse reticulonodular density which could be bilateral infection or interstitial edema. These results will be called to the ordering clinician or representative by the Radiologist Assistant, and communication documented in the PACS or zVision Dashboard.    Electronically Signed   By: Augusto Gamble M.D.   On: 11/16/2013 07:30   Dg Chest Port 1 View  11/15/2013   CLINICAL DATA:  Respiratory failure; cardiomyopathy  EXAM: PORTABLE CHEST - 1 VIEW  COMPARISON:  November 14, 2013  FINDINGS: Tracheostomy tube tip is 5.3 cm above the carina. Central catheter tip is in the superior vena cava near the cavoatrial junction. No pneumothorax. There is mild cardiomegaly with pulmonary vascular within normal limits. There is extensive interstitial edema. There are small effusions bilaterally. There is patchy consolidation in the medial left base. No new opacity.  IMPRESSION: Tube and catheter positions as described without pneumothorax. Evidence of congestive heart failure. Persistent consolidation medial left base. Lungs and cardiac silhouette appear unchanged compared to 1 day prior.   Electronically Signed   By: Bretta Bang M.D.   On: 11/15/2013 08:19   I have reviewed her recent Critical care and ID notes as well has her labs and recent x-ray reports.   Assessment: s/p Procedure(s): LEFT NEPHROLITHOTOMY PERCUTANEOUS INSERTION OF SUPRAPUBIC CATHETER  She is for a second look perc tomorrow.  Plan: We will proceed with surgery tomorrow if she is still clear from a CC standpoint.       LOS: 7 days    Franko Hilliker J 11/16/2013

## 2013-11-16 NOTE — Progress Notes (Signed)
VASCULAR LAB PRELIMINARY  PRELIMINARY  PRELIMINARY  PRELIMINARY  Right upper extremity venous duplex completed.    Preliminary report:  Technically difficult and limites due to severe edema. No obvious evidence of right upper extremity deep vein thrombosis. No obvious evidence of superficial thrombosis of the basilic vein. Unable to visualize the cephalic vein.  Juley Giovanetti, RVS 11/16/2013, 11:06 AM

## 2013-11-16 NOTE — Progress Notes (Signed)
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 She has lived in [facility] for the past 4 years. She was discharged by [facility] in Aug 2015 after an acute hospitalization for acute renal failure in setting of obstructive uropathy and urinary tract sepsis. During this time she required bilateral urostomy tubes placed 8/11 and 8/12. She presents 10/5 being brought in by urology for follow up of her obstructive uropathy, left percutaneous nephrolithotomy and possible suprapubic tube. PCCM asked to assist w/ supportive medical care.   STUDIES:  CT abd/pelvis 10/7: The number of left kidney stones are decreased compared to previous exam. The previously noted left mid  ureter stone is unchanged. There is a double pigtail left ureteral catheter without evidence of significant left hydronephrosis.  SIGNIFICANT EVENTS: 10/6  s/p perc left nephrolithotomy and placement of SP cath, tx'd unit of blood in OR 10/7: GNRs growing from Pleur-x tubes bilaterally, right also had few GPCs. Placed both Pleur-x tubes to continuous drainage via sahara.  10/8: no new issues. Awaiting final clture data. Both CTs flushed as minimal out-put.  10/9: both chest tubes removed 10/11: ID consulted for MDR Klebsiella (carbapenase positive)/ Providencia  And pseudomonas(still sensitive to imipenem). Also Pleural fluid w/ Klebsiella w/ ESBL produces and Providencia. Started on AVYCAZ and Flagyl per ID.    SUBJECTIVE:  No events overnight  VITAL SIGNS: Temp:  [98 F (36.7 C)-99.4 F (37.4 C)] 98 F  (36.7 C) (10/12 0742) Pulse Rate:  [72-91] 78 (10/12 0742) Resp:  [20-39] 20 (10/12 0742) BP: (130-176)/(65-82) 146/65 mmHg (10/12 0742) SpO2:  [100 %] 100 % (10/12 0742) FiO2 (%):  [30 %] 30 % (10/12 0742) Weight:  [72.6 kg (160 lb 0.9 oz)-74.3 kg (163 lb 12.8 oz)] 72.6 kg (160 lb 0.9 oz) (10/12 0400)  HEMODYNAMICS:    VENTILATOR SETTINGS: Vent Mode:  [-] PRVC FiO2 (%):  [30 %] 30 % Set Rate:  [20 bmp] 20 bmp Vt Set:  [500 mL] 500 mL PEEP:  [5 cmH20] 5 cmH20 Plateau Pressure:  [32 cmH20-35 cmH20] 35 cmH20  INTAKE / OUTPUT:  Intake/Output Summary (Last 24 hours) at 11/16/13 2951 Last data filed at 11/16/13 0900  Gross per 24 hour  Intake   1065 ml  Output    765 ml  Net    300 ml   PHYSICAL EXAMINATION: General:  Chronically ill appearing AAF in NAD Neuro:  Awake, follows commands. Generalized weakness. Left sided hemiparesis w/ right sided UE contractions and very little LE strength.  HEENT:  Right eye crusted and erythremic but appears improved when c/w admit, swelling on the right side of her face that is not tender (history of shingles). No new changes  Cardiovascular:  rrr Lungs:  Full support, few scattered rhonchi. Pleur-x dressings cd&i after removal on 10/9 Abdomen:  Soft, non-tender + bowel sounds, S/p cath unremarkable.  Musculoskeletal:  L sided hemiparesis  Skin:  Generalized anasarca, stage 2 sacral decub   LABS:  CBC  Recent Labs Lab 11/14/13 0540 11/15/13 0555 11/16/13 0325  WBC 20.1* 16.7* 14.3*  HGB  8.9* 8.1* 8.1*  HCT 26.3* 24.4* 24.5*  PLT 177 207 194   Coag's  Recent Labs Lab 11/09/13 1506  APTT 38*  INR 1.26   BMET  Recent Labs Lab 11/14/13 0540 11/15/13 0555 11/16/13 0325  NA 129* 125* 127*  K 4.3 4.5 4.7  CL 98 96 96  CO2 17* 16* 17*  BUN 53* 56* 59*  CREATININE 2.79* 2.91* 2.96*  GLUCOSE 93 94 97   Electrolytes  Recent Labs Lab 11/09/13 1506  11/14/13 0540 11/15/13 0555 11/16/13 0325  CALCIUM 8.0*  < > 8.0*  7.9* 8.2*  MG 2.0  --   --  2.1 2.2  PHOS 4.9*  --   --  4.6 4.8*  < > = values in this interval not displayed. Sepsis Markers  Recent Labs Lab 11/09/13 1700  PROCALCITON 1.35   ABG  Recent Labs Lab 11/09/13 1538  PHART 7.396  PCO2ART 32.6*  PO2ART 123.0*   Liver Enzymes  Recent Labs Lab 11/09/13 1506 11/11/13 0520 11/13/13 0544  AST 10 10 11   ALT 8 6 6   ALKPHOS 140* 121* 169*  BILITOT 0.3 0.3 0.4  ALBUMIN 1.6* 1.5* 1.4*   Cardiac Enzymes No results found for this basename: TROPONINI, PROBNP,  in the last 168 hours  Glucose  Recent Labs Lab 11/09/13 1211  GLUCAP 94   Imaging Dg Chest Port 1 View  11/15/2013   CLINICAL DATA:  Respiratory failure; cardiomyopathy  EXAM: PORTABLE CHEST - 1 VIEW  COMPARISON:  November 14, 2013  FINDINGS: Tracheostomy tube tip is 5.3 cm above the carina. Central catheter tip is in the superior vena cava near the cavoatrial junction. No pneumothorax. There is mild cardiomegaly with pulmonary vascular within normal limits. There is extensive interstitial edema. There are small effusions bilaterally. There is patchy consolidation in the medial left base. No new opacity.  IMPRESSION: Tube and catheter positions as described without pneumothorax. Evidence of congestive heart failure. Persistent consolidation medial left base. Lungs and cardiac silhouette appear unchanged compared to 1 day prior.   Electronically Signed   By: Bretta Bang M.D.   On: 11/15/2013 08:19   ASSESSMENT / PLAN:  PULMONARY OETT trach (Chronic)  A: Chronic respiratory failure - trach, vent dependent Tracheostomy status Bilateral empyema > cultured on admission, both w/ multiple GM neg organisms (Klebsiella CRE), and MDR [place]; Not a candidate for VATS Basilar R>L atx  CXR stable w/ bibasilar atx. We removed the CTs on 10/9 given concern that these may actually be colonization sites and above may represent bacterial colonization and not active infection.    P:   Full vent support (vent dependent at [facility]). PAD protocol. PRN CXRs See ID section   CARDIOVASCULAR RUE PICC (?8/8) >> A:  HTN  CAD, h/o cardiomyopathy  RUE swelling 10/12 P:  Cont home meds: clonidine, cardura, hydralazine, isordil, & labetalol Tele [place] RUE r/o DVT   RENAL A:  Acute kidney injury - in setting of obstructive uropathy s/p bilateral percutaneous nephrostomy tubes (placed 8/11 and 8/12). Now s/p left perc nephrolithotomy w/ large 2cm stone removed & insertion of suprapubic cath  Hyponatremia   Scr a little worse w/ persistent Metabolic acidosis  P:   Trend chemistry  Aim for negative fluid balance -->lasix gtt Replace electrolytes as indicated Post op wound care per urology  Plan for second look procedure next Tuesday at 7:30 am.  GASTROINTESTINAL A:   Protein calorie malnutrition  P:   TF as per nutrition  HEMATOLOGIC A:   Anemia of chronic disease - also has h/o positive stool guaiac.  Hgb was 7.4 on d/c in august. Did get 1 unit of blood during that stay.  S/P unit PRBC's in OR 10/6 P:  Continue darbopoietin No heparin for now, PAS Transfuse for hgb <7.0   INFECTIOUS A:   Empyema bilaterally vs pleurx tube colonization > present on admission cultures: CRE Klebsiella and MDR  Providencia. CTs removed 10/9 UT sepsis: Klebsiella (ESBL), providencia and pseudomonas (sens to imipenem) Right eye conjunctivitis Sacral decub   P:   Primaxin 10/5>>>10/11 Vanc 10/5 >>>10/9 Flagyl 10/11 (per ID)>>> AVYCAZ (per ID) 10/11>>> WOC  Isolation Cipro eye gtt's  ENDOCRINE A:  No acute process P:   Trend am chemistries   NEUROLOGIC A:   Neuromyelitis Optica Left sided hemiparesis Blind  P:   RASS goal: 0 Supportive care   Family updated:  Contact person: Darnelle Maffuccihillips, Anita Sister [phone]. Message left 10/8, will re-attempt to contact 10/12   Interdisciplinary Family Meeting v Palliative Care Meeting: n/a  TODAY'S SUMMARY:   Complicated patient Admitted by Urology from [facility] for possible for f/u and possible nephrolithotomy +/- suprapubic tube. She is chronically vent dependant and we treated her back in August for UT sepsis, acute renal failure and obstructive uropathy which was when her nephrostomy tubes were placed. Now s/p left renal/ureteral stone > 2 cm. W/ insertion of new left left nephrostomy tube and suprapubic cath.  Course complicated by anemia & HTN.  Tx PRBC's in OR on 10/6. 10/7 > now with bilateral infected pleural space> these have been removed.. Na dropped, cr increased. Looks volume overloaded. Second-look Urology plan d/t retained stones. Will US RUE to r/o DVT.   Anders SimmondsPete Babcock ACNP-BC [facility] Pager # [phone] OR # [phone] if no answer     11/16/2013, 9:27 AM   Reviewed above, examined.  She is to continue on Abx per ID.  Repositioned trach.  Doppler arm negative for DVT.  Urology to do relook on 10/13.  CC time 35 minutes.  Coralyn HellingVineet Demari Gales, MD [facility] 11/16/2013, 12:28 PM Pager:  [phone] After 3pm call: [phone]

## 2013-11-17 ENCOUNTER — Encounter (HOSPITAL_COMMUNITY)
Admission: RE | Disposition: A | Discharge status: Skilled Nursing Facility | Payer: Self-pay | Source: Ambulatory Visit | Attending: Pulmonary Disease

## 2013-11-17 ENCOUNTER — Inpatient Hospital Stay (HOSPITAL_COMMUNITY): Payer: Medicare Other

## 2013-11-17 ENCOUNTER — Encounter (HOSPITAL_COMMUNITY): Payer: Self-pay | Admitting: Certified Registered"

## 2013-11-17 ENCOUNTER — Inpatient Hospital Stay (HOSPITAL_COMMUNITY): Payer: Medicare Other | Admitting: Anesthesiology

## 2013-11-17 ENCOUNTER — Encounter (HOSPITAL_COMMUNITY): Payer: Medicare Other | Admitting: Anesthesiology

## 2013-11-17 HISTORY — PX: NEPHROLITHOTOMY: SHX5134

## 2013-11-17 LAB — BASIC METABOLIC PANEL
Anion gap: 14 (ref 5–15)
BUN: 65 mg/dL — AB (ref 6–23)
CHLORIDE: 96 meq/L (ref 96–112)
CO2: 17 mEq/L — ABNORMAL LOW (ref 19–32)
Calcium: 8 mg/dL — ABNORMAL LOW (ref 8.4–10.5)
Creatinine, Ser: 2.89 mg/dL — ABNORMAL HIGH (ref 0.50–1.10)
GFR calc non Af Amer: 16 mL/min — ABNORMAL LOW (ref >90)
GFR, EST AFRICAN AMERICAN: 19 mL/min — AB (ref >90)
GLUCOSE: 112 mg/dL — AB (ref 70–99)
POTASSIUM: 4.7 meq/L (ref 3.7–5.3)
Sodium: 127 mEq/L — ABNORMAL LOW (ref 137–147)

## 2013-11-17 LAB — CBC
HCT: 23.7 % — ABNORMAL LOW (ref 36.0–46.0)
HEMOGLOBIN: 7.7 g/dL — AB (ref 12.0–15.0)
MCH: 29.2 pg (ref 26.0–34.0)
MCHC: 32.5 g/dL (ref 30.0–36.0)
MCV: 89.8 fL (ref 78.0–100.0)
Platelets: 188 10*3/uL (ref 150–400)
RBC: 2.64 MIL/uL — AB (ref 3.87–5.11)
RDW: 16.9 % — ABNORMAL HIGH (ref 11.5–15.5)
WBC: 10.6 10*3/uL — ABNORMAL HIGH (ref 4.0–10.5)

## 2013-11-17 LAB — PREPARE RBC (CROSSMATCH)

## 2013-11-17 SURGERY — NEPHROLITHOTOMY PERCUTANEOUS SECOND LOOK
Anesthesia: General | Laterality: Left

## 2013-11-17 MED ORDER — LACTATED RINGERS IV SOLN: INTRAVENOUS | Status: DC

## 2013-11-17 MED ORDER — PHENYLEPHRINE 40 MCG/ML (10ML) SYRINGE FOR IV PUSH (FOR BLOOD PRESSURE SUPPORT)
PREFILLED_SYRINGE | INTRAVENOUS | Status: AC
  Filled 2013-11-17: qty 10

## 2013-11-17 MED ORDER — 0.9 % SODIUM CHLORIDE (POUR BTL) OPTIME
TOPICAL | Status: DC | PRN
  Administered 2013-11-17: 1000 mL

## 2013-11-17 MED ORDER — DEXTROSE-NACL 5-0.45 % IV SOLN
INTRAVENOUS | Status: DC
  Administered 2013-11-17: 12:00:00 via INTRAVENOUS

## 2013-11-17 MED ORDER — PHENYLEPHRINE HCL 10 MG/ML IJ SOLN
INTRAMUSCULAR | Status: AC
  Filled 2013-11-17: qty 1

## 2013-11-17 MED ORDER — FENTANYL CITRATE 0.05 MG/ML IJ SOLN
INTRAMUSCULAR | Status: DC | PRN
  Administered 2013-11-17 (×2): 50 ug via INTRAVENOUS

## 2013-11-17 MED ORDER — VASOPRESSIN 20 UNIT/ML IJ SOLN
INTRAMUSCULAR | Status: DC | PRN
  Administered 2013-11-17: 1 [IU] via INTRAVENOUS

## 2013-11-17 MED ORDER — MIDAZOLAM HCL 5 MG/5ML IJ SOLN
INTRAMUSCULAR | Status: DC | PRN
  Administered 2013-11-17 (×2): 1 mg via INTRAVENOUS

## 2013-11-17 MED ORDER — LIDOCAINE HCL (CARDIAC) 20 MG/ML IV SOLN
INTRAVENOUS | Status: AC
  Filled 2013-11-17: qty 5

## 2013-11-17 MED ORDER — SODIUM CHLORIDE 0.9 % IV SOLN
INTRAVENOUS | Status: DC | PRN
  Administered 2013-11-17: 10:00:00 via INTRAVENOUS

## 2013-11-17 MED ORDER — PHENYLEPHRINE HCL 10 MG/ML IJ SOLN
INTRAMUSCULAR | Status: DC | PRN
  Administered 2013-11-17: 40 ug via INTRAVENOUS
  Administered 2013-11-17: 80 ug via INTRAVENOUS
  Administered 2013-11-17: 40 ug via INTRAVENOUS
  Administered 2013-11-17: 80 ug via INTRAVENOUS

## 2013-11-17 MED ORDER — FENTANYL CITRATE 0.05 MG/ML IJ SOLN
INTRAMUSCULAR | Status: AC
  Filled 2013-11-17: qty 2

## 2013-11-17 MED ORDER — IOHEXOL 300 MG/ML  SOLN
INTRAMUSCULAR | Status: DC | PRN
  Administered 2013-11-17: 30 mL

## 2013-11-17 MED ORDER — SODIUM CHLORIDE 0.9 % IR SOLN
Status: DC | PRN
  Administered 2013-11-17: 6000 mL

## 2013-11-17 MED ORDER — MIDAZOLAM HCL 2 MG/2ML IJ SOLN
INTRAMUSCULAR | Status: AC
  Filled 2013-11-17: qty 2

## 2013-11-17 MED ORDER — PROPOFOL 10 MG/ML IV BOLUS
INTRAVENOUS | Status: AC
  Filled 2013-11-17: qty 20

## 2013-11-17 MED ORDER — PHENYLEPHRINE HCL 10 MG/ML IJ SOLN
10.0000 mg | INTRAMUSCULAR | Status: DC | PRN
  Administered 2013-11-17: 10 ug/min via INTRAVENOUS

## 2013-11-17 MED ORDER — LACTATED RINGERS IV SOLN
INTRAVENOUS | Status: DC | PRN
  Administered 2013-11-17: 09:00:00 via INTRAVENOUS

## 2013-11-17 SURGICAL SUPPLY — 44 items
BAG URINE DRAINAGE (UROLOGICAL SUPPLIES) ×3 IMPLANT
BASKET STONE NCOMPASS (UROLOGICAL SUPPLIES) ×3 IMPLANT
BASKET ZERO TIP NITINOL 2.4FR (BASKET) ×3 IMPLANT
BENZOIN TINCTURE PRP APPL 2/3 (GAUZE/BANDAGES/DRESSINGS) ×12 IMPLANT
BLADE SURG 15 STRL LF DISP TIS (BLADE) ×1 IMPLANT
BLADE SURG 15 STRL SS (BLADE) ×2
CATH FOLEY 2W COUNCIL 20FR 5CC (CATHETERS) IMPLANT
CATH ROBINSON RED A/P 20FR (CATHETERS) IMPLANT
CATH URET 5FR 28IN OPEN ENDED (CATHETERS) IMPLANT
CATH URET DUAL LUMEN 6-10FR 50 (CATHETERS) ×3 IMPLANT
CATH X-FORCE N30 NEPHROSTOMY (TUBING) ×3 IMPLANT
COVER SURGICAL LIGHT HANDLE (MISCELLANEOUS) ×3 IMPLANT
DRAPE C-ARM 42X120 X-RAY (DRAPES) ×3 IMPLANT
DRAPE CAMERA CLOSED 9X96 (DRAPES) ×3 IMPLANT
DRAPE LINGEMAN PERC (DRAPES) ×3 IMPLANT
DRAPE SURG IRRIG POUCH 19X23 (DRAPES) ×3 IMPLANT
DRSG PAD ABDOMINAL 8X10 ST (GAUZE/BANDAGES/DRESSINGS) ×6 IMPLANT
DRSG TEGADERM 8X12 (GAUZE/BANDAGES/DRESSINGS) ×6 IMPLANT
EXTRACTOR STONE NITINOL NGAGE (UROLOGICAL SUPPLIES) ×3 IMPLANT
FIBER LASER FLEXIVA 1000 (UROLOGICAL SUPPLIES) IMPLANT
FIBER LASER FLEXIVA 550 (UROLOGICAL SUPPLIES) IMPLANT
GAUZE SPONGE 4X4 12PLY STRL (GAUZE/BANDAGES/DRESSINGS) ×3 IMPLANT
GLOVE SURG SS PI 8.0 STRL IVOR (GLOVE) ×3 IMPLANT
GOWN STRL REUS W/TWL XL LVL3 (GOWN DISPOSABLE) ×3 IMPLANT
KIT BASIN OR (CUSTOM PROCEDURE TRAY) ×3 IMPLANT
MANIFOLD NEPTUNE II (INSTRUMENTS) ×3 IMPLANT
NS IRRIG 1000ML POUR BTL (IV SOLUTION) IMPLANT
PACK BASIC VI WITH GOWN DISP (CUSTOM PROCEDURE TRAY) ×3 IMPLANT
PROBE LITHOCLAST ULTRA 3.8X403 (UROLOGICAL SUPPLIES) IMPLANT
PROBE PNEUMATIC 1.0MMX570MM (UROLOGICAL SUPPLIES) IMPLANT
SET IRRIG Y TYPE TUR BLADDER L (SET/KITS/TRAYS/PACK) ×3 IMPLANT
SET WARMING FLUID IRRIGATION (MISCELLANEOUS) IMPLANT
SHEATH ACCESS URETERAL 24CM (SHEATH) ×3 IMPLANT
SPONGE LAP 4X18 X RAY DECT (DISPOSABLE) ×3 IMPLANT
STONE CATCHER W/TUBE ADAPTER (UROLOGICAL SUPPLIES) IMPLANT
SUT SILK 2 0 30  PSL (SUTURE) ×4
SUT SILK 2 0 30 PSL (SUTURE) ×2 IMPLANT
SYR 20CC LL (SYRINGE) ×6 IMPLANT
SYRINGE 10CC LL (SYRINGE) ×3 IMPLANT
TOWEL OR NON WOVEN STRL DISP B (DISPOSABLE) ×3 IMPLANT
TRAY FOLEY CATH 14FRSI W/METER (CATHETERS) IMPLANT
TUBE CONNECTING VINYL 14FR 30C (MISCELLANEOUS) ×3 IMPLANT
TUBING CONNECTING 10 (TUBING) ×6 IMPLANT
TUBING CONNECTING 10' (TUBING) ×3

## 2013-11-17 NOTE — Progress Notes (Addendum)
Patient ID: [name], female   DOB: 30-Oct-1950, 63 y.o.   MRN: [id number] Day of Surgery  Subjective: No issues after second look PCNL. Stones cleared from kidney and ureter with placement of internal-external nephroureteral stent. Urine blood-tinged as expected but reasonable and draining well. Transfused in OR.  ROS:  Review of Systems  Unable to perform ROS: intubated    Anti-infectives: Anti-infectives   Start     Dose/Rate Route Frequency Ordered Stop   11/15/13 1500  ceftazidime-avibactam (AVYCAZ) 0.94 g in dextrose 5 % 50 mL IVPB     0.94 g 25 mL/hr over 2 Hours Intravenous Every 12 hours 11/15/13 1427     11/15/13 1300  metroNIDAZOLE (FLAGYL) IVPB 500 mg     500 mg 100 mL/hr over 60 Minutes Intravenous Every 8 hours 11/15/13 1259     11/14/13 1800  vancomycin (VANCOCIN) IVPB 750 mg/150 ml premix  Status:  Discontinued     750 mg 150 mL/hr over 60 Minutes Intravenous Every 48 hours 11/12/13 2007 11/13/13 0938   11/10/13 1800  vancomycin (VANCOCIN) 500 mg in sodium chloride 0.9 % 100 mL IVPB  Status:  Discontinued     500 mg 100 mL/hr over 60 Minutes Intravenous Every 24 hours 11/09/13 1930 11/12/13 2007   11/10/13 0600  ciprofloxacin (CIPRO) IVPB 400 mg  Status:  Discontinued     400 mg 200 mL/hr over 60 Minutes Intravenous 60 min pre-op 11/09/13 1403 11/09/13 1521   11/10/13 0400  imipenem-cilastatin (PRIMAXIN) 250 mg in sodium chloride 0.9 % 100 mL IVPB  Status:  Discontinued     250 mg 200 mL/hr over 30 Minutes Intravenous Every 12 hours 11/09/13 1930 11/15/13 1259   11/09/13 1700  vancomycin (VANCOCIN) IVPB 1000 mg/200 mL premix     1,000 mg 200 mL/hr over 60 Minutes Intravenous  Once 11/09/13 1617 11/09/13 1822   11/09/13 1600  imipenem-cilastatin (PRIMAXIN) 500 mg in sodium chloride 0.9 % 100 mL IVPB     500 mg 200 mL/hr over 30 Minutes Intravenous  Once 11/09/13 1524 11/09/13 1623      Current Facility-Administered Medications  Medication Dose Route  Frequency Provider Last Rate Last Dose  . 0.9 %  sodium chloride infusion  250 mL Intravenous PRN Simonne Martinet, NP   250 mL at 11/16/13 1522  . acetaminophen (TYLENOL) tablet 650 mg  650 mg Oral Q6H PRN Alyson Reedy, MD   650 mg at 11/13/13 0000  . antiseptic oral rinse (CPC / CETYLPYRIDINIUM CHLORIDE 0.05%) solution 7 mL  7 mL Mouth Rinse QID Simonne Martinet, NP   7 mL at 11/17/13 1214  . calcium acetate (PHOSLO) capsule 1,334 mg  1,334 mg Oral TID WC Heloise Purpura, MD   1,334 mg at 11/16/13 1813  . carbamazepine (TEGRETOL) chewable tablet 100 mg  100 mg Oral BID Heloise Purpura, MD   100 mg at 11/16/13 2103  . ceftazidime-avibactam (AVYCAZ) 0.94 g in dextrose 5 % 50 mL IVPB  0.94 g Intravenous Q12H Christine E Shade, RPH   0.94 g at 11/17/13 1528  . chlorhexidine (PERIDEX) 0.12 % solution 15 mL  15 mL Mouth Rinse BID Simonne Martinet, NP   15 mL at 11/17/13 0809  . ciprofloxacin (CILOXAN) 0.3 % ophthalmic solution 2 drop  2 drop Right Eye QID Simonne Martinet, NP   2 drop at 11/17/13 1358  . cloNIDine (CATAPRES) tablet 0.3 mg  0.3 mg Oral TID Heloise Purpura, MD   0.3  mg at 11/17/13 1528  . darbepoetin (ARANESP) injection 40 mcg  40 mcg Subcutaneous Q Sat-1800 Heloise PurpuraLester Borden, MD   40 mcg at 11/14/13 1851  . dextrose 5 %-0.45 % sodium chloride infusion   Intravenous Continuous Simonne MartinetPeter E Babcock, NP      . dextrose 5 %-0.45 % sodium chloride infusion   Intravenous Continuous Coralyn HellingVineet Sood, MD 10 mL/hr at 11/17/13 1212    . docusate (COLACE) 50 MG/5ML liquid 100 mg  100 mg Per Tube BID Otho Bellowserri L Green, RPH   100 mg at 11/16/13 2111  . doxazosin (CARDURA) tablet 4 mg  4 mg Oral Q12H Heloise PurpuraLester Borden, MD   4 mg at 11/16/13 2106  . famotidine (PEPCID) tablet 20 mg  20 mg Oral Daily Heloise PurpuraLester Borden, MD   20 mg at 11/16/13 1046  . feeding supplement (ENSURE) (ENSURE) pudding 1 Container  1 Container Oral TID BM Tenny CrawHeather S Winkler, RD   1 Container at 11/17/13 1529  . feeding supplement (PRO-STAT SUGAR FREE 64) liquid  30 mL  30 mL Oral QPC supper Heloise PurpuraLester Borden, MD   30 mL at 11/16/13 1815  . fentaNYL (SUBLIMAZE) injection 100 mcg  100 mcg Intravenous Q15 min PRN Simonne MartinetPeter E Babcock, NP   100 mcg at 11/10/13 1504  . fentaNYL (SUBLIMAZE) injection 100 mcg  100 mcg Intravenous Q2H PRN Simonne MartinetPeter E Babcock, NP   100 mcg at 11/17/13 1356  . ferrous sulfate tablet 325 mg  325 mg Oral BID WC Heloise PurpuraLester Borden, MD   325 mg at 11/16/13 1813  . hydrALAZINE (APRESOLINE) injection 10 mg  10 mg Intravenous Q4H PRN Simonne MartinetPeter E Babcock, NP   10 mg at 11/11/13 1104  . hydrALAZINE (APRESOLINE) tablet 100 mg  100 mg Oral 4 times per day Simonne MartinetPeter E Babcock, NP   100 mg at 11/17/13 0503  . isosorbide dinitrate (ISORDIL) tablet 20 mg  20 mg Oral TID Heloise PurpuraLester Borden, MD   20 mg at 11/17/13 1528  . labetalol (NORMODYNE) tablet 600 mg  600 mg Oral BID Heloise PurpuraLester Borden, MD   600 mg at 11/16/13 2107  . levETIRAcetam (KEPPRA) 100 MG/ML solution 500 mg  500 mg Oral BID Dannielle Huhustin George Zeigler, RPH   500 mg at 11/16/13 2111  . metroNIDAZOLE (FLAGYL) IVPB 500 mg  500 mg Intravenous Q8H Randall Hissornelius N Van Dam, MD   500 mg at 11/17/13 1213  . multivitamin with minerals tablet 1 tablet  1 tablet Oral Daily Heloise PurpuraLester Borden, MD   1 tablet at 11/16/13 1045  . oxyCODONE (Oxy IR/ROXICODONE) immediate release tablet 5 mg  5 mg Oral Q4H PRN Heloise PurpuraLester Borden, MD   5 mg at 11/16/13 1106  . pantoprazole sodium (PROTONIX) 40 mg/20 mL oral suspension 40 mg  40 mg Per Tube Daily Otho Bellowserri L Green, RPH   40 mg at 11/16/13 1030  . pregabalin (LYRICA) capsule 100 mg  100 mg Oral Daily Heloise PurpuraLester Borden, MD   100 mg at 11/16/13 1045  . tiZANidine (ZANAFLEX) tablet 2 mg  2 mg Oral TID Heloise PurpuraLester Borden, MD   2 mg at 11/17/13 1528     Objective: Vital signs in last 24 hours: Temp:  [95.1 F (35.1 C)-98 F (36.7 C)] 97 F (36.1 C) (10/13 1600) Pulse Rate:  [63-77] 77 (10/13 1600) Resp:  [20] 20 (10/13 1531) BP: (118-163)/(52-84) 138/68 mmHg (10/13 1600) SpO2:  [100 %] 100 % (10/13 1600) FiO2 (%):   [30 %] 30 % (10/13 1531) Weight:  [74.5 kg (  164 lb 3.9 oz)] 74.5 kg (164 lb 3.9 oz) (10/13 0500)  Intake/Output from previous day: 10/12 0701 - 10/13 0700 In: 1428.7 [P.O.:600; I.V.:428.7; IV Piggyback:400] Out: 1115 [Urine:1115] Intake/Output this shift: Total I/O In: 1235.7 [I.V.:750.7; Blood:335; IV Piggyback:150] Out: 315 [Urine:315]   Physical Exam GU: left NUS draining blood tinged urine  Lab Results:   Recent Labs  11/16/13 0325 11/17/13 0500  WBC 14.3* 10.6*  HGB 8.1* 7.7*  HCT 24.5* 23.7*  PLT 194 188   BMET  Recent Labs  11/16/13 1800 11/17/13 0040  NA 128* 127*  K 4.7 4.7  CL 97 96  CO2 16* 17*  GLUCOSE 142* 112*  BUN 61* 65*  CREATININE 2.79* 2.89*  CALCIUM 7.7* 8.0*   PT/INR No results found for this basename: LABPROT, INR,  in the last 72 hours ABG No results found for this basename: PHART, PCO2, PO2, HCO3,  in the last 72 hours  Studies/Results: Dg Chest Port 1 View  11/16/2013   CLINICAL DATA:  63 year old female with chronic respiratory failure status post tracheostomy. Chronic renal failure. Bilateral pleural effusions. Initial encounter.  EXAM: PORTABLE CHEST - 1 VIEW  COMPARISON:  11/15/2013 and earlier.  FINDINGS: The tracheostomy tube now has a horizontal configuration and may be malpositioned.  Stable right PICC line.  Stable cardiac size and mediastinal contours. Continued somewhat low lung volumes. Bilateral veiling opacity at the lung bases with superimposed diffuse reticulonodular opacity in both lungs. No pneumothorax.  IMPRESSION: 1. Tracheostomy tube now has a horizontal configuration suspicious for malpositioning. 2. Stable ventilation with small bilateral pleural effusions superimposed on diffuse reticulonodular density which could be bilateral infection or interstitial edema. These results will be called to the ordering clinician or representative by the Radiologist Assistant, and communication documented in the PACS or zVision  Dashboard.   Electronically Signed   By: Augusto Gamble M.D.   On: 11/16/2013 07:30   Dg C-arm Gt 120 Min-no Report  11/17/2013   CLINICAL DATA: left perc   C-ARM GT 120 MINUTE  Fluoroscopy was utilized by the requesting physician.  No radiographic  interpretation.    I have reviewed her recent Critical care and ID notes as well has her labs and recent x-ray reports.   Assessment: s/p Procedure(s): LEFT NEPHROLITHOTOMY PERCUTANEOUS INSERTION OF SUPRAPUBIC CATHETER   Plan: 1. Keep left NUS for 1 week. This can be removed after antegrade nephrostogram. Flush neph tube PRN for poor drainage or increased leakage around the site. 2. Right nephrostomy tube can be removed at any point after antegrade nephrostogram is performed to confirm adequate drainage.  I have seen and examined the patient and reviewed the labs and agree with the plan.    LOS: 8 days    Laural Benes, DAVID C 11/17/2013

## 2013-11-17 NOTE — Anesthesia Postprocedure Evaluation (Signed)
  Anesthesia Post-op Note  Patient: [name]  Procedure(s) Performed: Procedure(s) (LRB):  SECOND LOOK LEFT PERCUTANEOUS NEPHROLITHOTOMY  (Left)  Patient Location: PACU  Anesthesia Type: General  Level of Consciousness: sedated   Airway and Oxygen Therapy: mechanically ventilated through trach  Post-op Pain: no apparent signs of pain  Post-op Assessment: Post-op Vital signs reviewed, Patient's Cardiovascular Status Stable, Respiratory Function Stable, Patent Airway and No signs of Nausea or vomiting  Last Vitals:  Filed Vitals:   11/17/13 1219  BP: 154/79  Pulse: 63  Temp: 36.3 C  Resp:     Post-op Vital Signs: stable   Complications: No apparent anesthesia complications

## 2013-11-17 NOTE — Transfer of Care (Signed)
Immediate Anesthesia Transfer of Care Note  Patient: [name]  Procedure(s) Performed: Procedure(s):  SECOND LOOK LEFT PERCUTANEOUS NEPHROLITHOTOMY  (Left)  Patient Location: ICU  Anesthesia Type:General  Level of Consciousness: awake, sedated and Patient remains intubated per anesthesia plan  Airway & Oxygen Therapy: Patient placed on Ventilator (see vital sign flow sheet for setting)  Post-op Assessment: Report given to PACU RN and Post -op Vital signs reviewed and stable  Post vital signs: Reviewed and stable  Complications: No apparent anesthesia complications

## 2013-11-17 NOTE — Plan of Care (Signed)
Problem: Phase I Progression Outcomes Goal: OOB as tolerated unless otherwise ordered Outcome: Not Applicable Date Met:  02/77/41 Pt is unable to ambulate per medical conditions.

## 2013-11-17 NOTE — Anesthesia Preprocedure Evaluation (Signed)
Anesthesia Evaluation  Patient identified by MRN, date of birth, ID band Patient awake    Reviewed: Allergy & Precautions, H&P , NPO status , Patient's Chart, lab work & pertinent test results  Airway Mallampati: II TM Distance: >3 FB Neck ROM: Full    Dental no notable dental hx.    Pulmonary pneumonia -,  Pleural effusion s/p chest tubes, bilateral empyema breath sounds clear to auscultation  Pulmonary exam normal       Cardiovascular hypertension, Pt. on medications and Pt. on home beta blockers + CAD Rhythm:Regular Rate:Normal     Neuro/Psych Seizures -,  PSYCHIATRIC DISORDERS Anxiety Depression    GI/Hepatic negative GI ROS, Neg liver ROS,   Endo/Other  negative endocrine ROS  Renal/GU Renal disease  negative genitourinary   Musculoskeletal negative musculoskeletal ROS (+)   Abdominal   Peds negative pediatric ROS (+)  Hematology negative hematology ROS (+) anemia ,   Anesthesia Other Findings   Reproductive/Obstetrics negative OB ROS                           Anesthesia Physical Anesthesia Plan  ASA: IV  Anesthesia Plan: General   Post-op Pain Management:    Induction: Intravenous  Airway Management Planned: Tracheostomy  Additional Equipment:   Intra-op Plan:   Post-operative Plan: Post-operative intubation/ventilation  Informed Consent: I have reviewed the patients History and Physical, chart, labs and discussed the procedure including the risks, benefits and alternatives for the proposed anesthesia with the patient or authorized representative who has indicated his/her understanding and acceptance.   Dental advisory given  Plan Discussed with: CRNA  Anesthesia Plan Comments:         Anesthesia Quick Evaluation

## 2013-11-17 NOTE — Progress Notes (Signed)
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 She has lived in [facility] for the past 4 years. She was discharged by [facility] in Aug 2015 after an acute hospitalization for acute renal failure in setting of obstructive uropathy and urinary tract sepsis. During this time she required bilateral urostomy tubes placed 8/11 and 8/12. She presents 10/5 being brought in by urology for follow up of her obstructive uropathy, left percutaneous nephrolithotomy and possible suprapubic tube. PCCM asked to assist w/ supportive medical care.   STUDIES:  CT abd/pelvis 10/7: The number of left kidney stones are decreased compared to previous exam. The previously noted left mid  ureter stone is unchanged. There is a double pigtail left ureteral catheter without evidence of significant left hydronephrosis.  SIGNIFICANT EVENTS: 10/6  s/p perc left nephrolithotomy and placement of SP cath, tx'd unit of blood in OR 10/7: GNRs growing from Pleur-x tubes bilaterally, right also had few GPCs. Placed both Pleur-x tubes to continuous drainage via sahara.  10/8: no new issues. Awaiting final clture data. Both CTs flushed as minimal out-put.  10/9: both chest tubes removed 10/11: ID consulted for MDR Klebsiella (carbapenase positive)/ Providencia  And pseudomonas(still sensitive to imipenem). Also Pleural fluid w/ Klebsiella w/ ESBL produces and Providencia. Started on AVYCAZ and Flagyl per ID.  10/13: back to OR>>>  SUBJECTIVE:   No sig change   VITAL SIGNS: Temp:  [97.8 F (36.6 C)-98.2  F (36.8 C)] 97.8 F (36.6 C) (10/13 0400) Pulse Rate:  [68-79] 77 (10/13 0750) Resp:  [20] 20 (10/13 0750) BP: (118-163)/(52-80) 133/70 mmHg (10/13 0750) SpO2:  [100 %] 100 % (10/13 0750) FiO2 (%):  [30 %] 30 % (10/13 0750) Weight:  [74.5 kg (164 lb 3.9 oz)] 74.5 kg (164 lb 3.9 oz) (10/13 0500)  HEMODYNAMICS:    VENTILATOR SETTINGS: Vent Mode:  [-] PRVC FiO2 (%):  [30 %] 30 % Set Rate:  [20 bmp] 20 bmp Vt Set:  [500 mL] 500 mL PEEP:  [5 cmH20] 5 cmH20 Plateau Pressure:  [29 cmH20-37 cmH20] 33 cmH20  INTAKE / OUTPUT:  Intake/Output Summary (Last 24 hours) at 11/17/13 0815 Last data filed at 11/17/13 0600  Gross per 24 hour  Intake 1428.73 ml  Output   1115 ml  Net 313.73 ml   PHYSICAL EXAMINATION: General:  Chronically ill appearing AAF in NAD Neuro:  Awake, follows commands. Generalized weakness. Left sided hemiparesis w/ right sided UE contractions and very little LE strength.  HEENT:  Right eye crusted and erythremic but appears improved when c/w admit, swelling on the right side of her face that is not tender (history of shingles). No new changes  Cardiovascular:  rrr Lungs:  Full support, few scattered rhonchi. Pleur-x dressings cd&i  Abdomen:  Soft, non-tender + bowel sounds, S/p cath unremarkable.  Musculoskeletal:  L sided hemiparesis  Skin:  Generalized anasarca, stage 2 sacral decub   LABS:  CBC  Recent Labs Lab 11/15/13 0555 11/16/13 0325 11/17/13 0500  WBC 16.7* 14.3* 10.6*  HGB 8.1* 8.1* 7.7*  HCT 24.4* 24.5* 23.7*  PLT 207 194 188    BMET  Recent Labs Lab 11/16/13 0325 11/16/13 1800 11/17/13 0040  NA 127* 128* 127*  K 4.7 4.7 4.7  CL 96 97 96  CO2 17* 16* 17*  BUN 59* 61* 65*  CREATININE 2.96* 2.79* 2.89*  GLUCOSE 97 142* 112*   Electrolytes  Recent Labs Lab 11/15/13 0555 11/16/13 0325 11/16/13 1800 11/17/13 0040  CALCIUM 7.9* 8.2* 7.7* 8.0*  MG 2.1 2.2  --   --   PHOS 4.6 4.8*  --   --    Sepsis Markers No results found  for this basename: LATICACIDVEN, PROCALCITON, O2SATVEN,  in the last 168 hours ABG No results found for this basename: PHART, PCO2ART, PO2ART,  in the last 168 hours Liver Enzymes  Recent Labs Lab 11/11/13 0520 11/13/13 0544  AST 10 11  ALT 6 6  ALKPHOS 121* 169*  BILITOT 0.3 0.4  ALBUMIN 1.5* 1.4*   Cardiac Enzymes No results found for this basename: TROPONINI, PROBNP,  in the last 168 hours  Glucose No results found for this basename: GLUCAP,  in the last 168 hours Imaging Dg Chest Port 1 View  11/16/2013   CLINICAL DATA:  63 year old female with chronic respiratory failure status post tracheostomy. Chronic renal failure. Bilateral pleural effusions. Initial encounter.  EXAM: PORTABLE CHEST - 1 VIEW  COMPARISON:  11/15/2013 and earlier.  FINDINGS: The tracheostomy tube now has a horizontal configuration and may be malpositioned.  Stable right PICC line.  Stable cardiac size and mediastinal contours. Continued somewhat low lung volumes. Bilateral veiling opacity at the lung bases with superimposed diffuse reticulonodular opacity in both lungs. No pneumothorax.  IMPRESSION: 1. Tracheostomy tube now has a horizontal configuration suspicious for malpositioning. 2. Stable ventilation with small bilateral pleural effusions superimposed on diffuse reticulonodular density which could be bilateral infection or interstitial edema. These results will be called to the ordering clinician or representative by the Radiologist Assistant, and communication documented in the PACS or zVision Dashboard.   Electronically Signed   By: Augusto Gamble M.D.   On: 11/16/2013 07:30   ASSESSMENT / PLAN:  PULMONARY OETT trach (Chronic)  A: Chronic respiratory failure - trach, vent dependent Tracheostomy status Bilateral empyema > cultured on admission, both w/ multiple GM neg organisms (Klebsiella CRE), and MDR Providenia; Not a candidate for VATS Basilar R>L atx  CXR stable w/ bibasilar atx. We removed the CTs on  10/9 given concern that these may actually be colonization sites and above may represent bacterial colonization and not active infection.   P:   Full vent support (vent dependent at [facility]). PAD protocol. PRN CXRs See ID section   CARDIOVASCULAR RUE PICC (?8/8) >> A:  HTN  CAD, h/o cardiomyopathy  RUE swelling 10/12>>US neg for DVT  P:  Cont home meds: clonidine, cardura, hydralazine, isordil, & labetalol Tele  RENAL A:  Acute kidney injury - in setting of obstructive uropathy s/p bilateral percutaneous nephrostomy tubes (placed 8/11 and 8/12). Now s/p left perc nephrolithotomy w/ large 2cm stone removed & insertion of suprapubic cath  Hyponatremia   Scr a little worse w/ persistent Metabolic acidosis  P:   Trend chemistry  Cont lasix per [facility] dosing  Replace electrolytes as indicated Post op wound care per urology  Plan for second look procedure today 10/13  GASTROINTESTINAL A:   Protein calorie malnutrition  P:   TF as per nutrition  HEMATOLOGIC A:   Anemia of chronic disease - also has h/o positive stool  guaiac.  Hgb was 7.4 on d/c in august. Did get 1 unit of blood during that stay.  S/P unit PRBC's in OR 10/6 P:  Continue darbopoietin No heparin for now, PAS Transfuse for hgb <7.0   INFECTIOUS A:   Empyema bilaterally vs pleurx tube colonization > present on admission cultures: CRE Klebsiella and MDR  Providencia. CTs removed 10/9 UT sepsis: Klebsiella (ESBL), providencia and pseudomonas (sens to imipenem) Right eye conjunctivitis Sacral decub   P:   Primaxin 10/5>>>10/11 Vanc 10/5 >>>10/9 Flagyl 10/11 (per ID; recommends 14d)  AVYCAZ (per ID) 10/11 (recommends 14d) WOC  Isolation Cipro eye gtt's  ENDOCRINE A:  No acute process P:   Trend am chemistries   NEUROLOGIC A:   Neuromyelitis Optica Left sided hemiparesis Blind  P:   RASS goal: 0 Supportive care   Family updated:  Contact person: Darnelle Maffuccihillips, Anita Sister [phone]. Marland Kitchen. Have  not been able to contact family. Per the patient her sister works in schools and visits on weekends.   Interdisciplinary Family Meeting v Palliative Care Meeting: have not been able to meet w/ pt's family. She desires full medical care at this point.   TODAY'S SUMMARY:  For second look today 10/13. If stable in am back to [facility] 10/14   Anders SimmondsPete Babcock ACNP-BC [facility] Pulmonary/Critical Care Pager # [phone] OR # [phone] if no answer     11/17/2013, 8:15 AM   She is s/p Lt stone removal with urology this AM w/o incident.  If stable, she might be ready for transfer back to [facility] in next 24 to 48 hrs.  Coralyn HellingVineet Xiamara Hulet, MD [facility] 11/17/2013, 12:30 PM Pager:  [phone] After 3pm call: [phone]

## 2013-11-17 NOTE — Progress Notes (Signed)
Pt bagged to OR

## 2013-11-17 NOTE — Progress Notes (Signed)
Patient ID: [name], female   DOB: [birth year], 63 y.o.   MRN: [id number]   I was able to clear the left ureteral stone and additional left renal stones.   I have left a left nephroureteral catheter because of edema at the site of the impacted left ureteral stone.     I would like for this tube to be left for at least a week and then she will need an antegrade nephrostogram upon removal.    Her right nephrostomy could be removed at any time if an antegrade nephrostogram confirms a patent ureter.  This could be done prior to discharge.

## 2013-11-17 NOTE — Op Note (Signed)
Preoperative diagnosis:  1. Left renal and nephrolithiasis  Postoperative diagnosis: 1. Same  Procedure(s): 1. Second stage left percutaneous nephroscopy with removal of stone <2cm 2. Left antegrade ureteroscopy with removal of stone 3. Left Antegrade nephrostogram with interpretation 4. Exchange of left nephrostomy tube  Surgeon: Dr. Bjorn Pippin  Assistant: Dr. Harlene Salts  Anesthesia: General  Complications: None  EBL: Minimal  Specimens: None  Intraoperative findings: Small renal stone fragments were removed. The majority of the fragments seen on CT had been washed down the ureter. Significant stone burden in the mid ureter that was removed in its entirety in multiple pieces. The remainder of the ureter was free of stone. No ureteral injury or perforation.  Indication: 73F with significant renal and ureteral stone burden s/p staged left PCNL 1 week ago. In hospital for medical issues. Returns to OR to remove remaining stone burden.   Description of procedure:  Patient and laterality of surgery were verified in holding and she was brought to the OR. She is tracheostomy dependent. General anesthesia was induced and she was placed in the lateral position with left side up. IV antibiotics had been given on the floor as directed by ID for her multi-drug resistant, multi-organism UTI. SCDs were placed. Her nephrostomy tube and Kumpe catheter were prepped into the field and she was draped in the usual sterile fashion. A surgical time out was called.  A superstiff working wire was advanced down the Kumpe catheter into the bladder. The 24Fr nephrostomy tube was removed. A flexible nephroscope was advanced into the kidney through the established tract and complete nephroscopy was performed. Several small stones were removed with the 0-tip nitinol basket. Once the kidney was cleared, we turned out attention to the proximal ureter. There was a narrowing at the proximal ureter so we elected to  dilate with the inside of a digital ureteral access sheath. The outer sheath was placed and flexible ureteroscopy was performed. The stone was located in the mid ureter and was soft/brittle so that it could be completely removed with the Encompass then Engage baskets. Complete ureteroscopy was performed down to the bladder confirming no distal stones. The sheath was pulled back with the scope to completely visualize the proximal ureter and no stones remained. The scope was removed and a internal-external nephroureteral stent was placed over the wire and into the bladder. The renal pelvic portion of the stent would not curl so the stent was sutured into place. She was awoken from anesthesia having tolerated the procedure well.

## 2013-11-18 ENCOUNTER — Encounter (HOSPITAL_COMMUNITY): Payer: Self-pay | Admitting: Urology

## 2013-11-18 LAB — BASIC METABOLIC PANEL
ANION GAP: 16 — AB (ref 5–15)
BUN: 64 mg/dL — ABNORMAL HIGH (ref 6–23)
CALCIUM: 7.9 mg/dL — AB (ref 8.4–10.5)
CHLORIDE: 97 meq/L (ref 96–112)
CO2: 16 mEq/L — ABNORMAL LOW (ref 19–32)
Creatinine, Ser: 2.75 mg/dL — ABNORMAL HIGH (ref 0.50–1.10)
GFR calc Af Amer: 20 mL/min — ABNORMAL LOW (ref >90)
GFR calc non Af Amer: 17 mL/min — ABNORMAL LOW (ref >90)
Glucose, Bld: 104 mg/dL — ABNORMAL HIGH (ref 70–99)
Potassium: 4.6 mEq/L (ref 3.7–5.3)
Sodium: 129 mEq/L — ABNORMAL LOW (ref 137–147)

## 2013-11-18 NOTE — Discharge Summary (Signed)
Physician Discharge Summary       Patient ID: [name] MRN: [id number] DOB/AGE: 06-10-1950 63 y.o.  Admit date: 11/09/2013 Discharge date: 11/19/2013  Discharge Diagnoses:   Acute kidney injury - in setting of obstructive uropathy s/p bilateral percutaneous nephrostomy tubes (placed 8/11 and 8/12). Now s/p left perc nephrolithotomy w/ large 2cm stone removed & insertion of suprapubic cath on 10/6 Chronic respiratory failure with ventilator dependence  Hyponatremia  Metabolic acidosis  Bilateral Empyema with cultures growing: Klebsiella (both ESBL AND carbapenemase producer positive); also positive for Providencia Stuartii.  HTN  CAD with history of cardiomyopathy  Protein Calorie Malnutrition  Anemia of Chronic Disease.  Neuromyelitis Optica  Left sided hemiparesis  Blind  Detailed Hospital Course:    63 y/o F with a complicated PMH of neuromyelitis optica with blindness hemiparesis on the left side, HTN, cardiomyopathy, CAD, anemia of chronic illness, anxiety and depression, and tracheostomy w/ vent dependence. She has lived in [place] for the past 4 years. She was discharged by [facility] in Aug 2015 after an acute hospitalization for acute renal failure in setting of obstructive uropathy and urinary tract sepsis. During this time she required bilateral urostomy tubes placed 8/11 and 8/12. She presents 10/5 being brought in by urology for follow up of her obstructive uropathy, left percutaneous nephrolithotomy and possible suprapubic tube. PCCM asked to assist w/ supportive medical care.   She was admitted to the intensive care. On arrival it was noted she had two new pleurx tubes which had been placed since her discharge last from [facility]. Routine lab work was sent. This included Pan-cultures in addition to chemistries, CBC and coags. She went to the OR on 10/6. Underwent: percutaneous left nephrolithotomy and placement of SP cath. Intra op she was transfused 1 unit of blood. Intra op  she was noted to have greater than 2 cm of stone burden. In the OR Dr Annabell Howells was able to remove a moderate amount of stone material from the left kidney and proximal ureter, but she has 2 stones impacted further down that he couldn't access. A left a ureteral catheter in addition to her nephrostomy tube to dilate the ureter. A F/u CT scan was obtained on 10/7 to reassess stone status. These findings reported: The number of left kidney stones are decreased compared to previous exam. The previously noted left mid ureter stone is unchanged. There is a double pigtail left ureteral catheter without evidence of significant left hydronephrosis. The tube was left with plan for staged, re-look in the OR at a later date.   On 11/7 cultures from admit were reported. These initially showed Gram negative Rods growing from the pleurx tubes. Because of this we placed both chest tubes to Sahara suction devices to completely drain the pleural space. Both tubes were flushed with minimal out-put. We then removed the tubes on 10/9 after monitoring as we felt that they were simply sites for bacterial colonization at this point and not actually therapeutic. Final Culture reports came back on 10/11. The fluid sent from the pleurx tubes grew back a MDR Klebsiella which was both a ESBL producer AND a carbapenemase producer. She also grew out Providencia but this was sensitive to the imipenem which we had started on day of admit. She ALSO grew out multi drug resistant Klebsiella in her urine, in addition to MDR pseudomonas and Providencia (both of these were sensitive to imipenem). Her sputum cultures grew normal flora and her blood cultures were negative. Based on the complexity of  her culture data we consulted infectious disease. It was unclear if these were all colonized organisms or acute infection but we went ahead and treated them as active infections based on infectious disease recs we started her on AVYCAZ and flagyl. It was  recommended that these be continued for 14 days.   In the mean-time we continued supportive care. Her major medical issues have been boarderline hyponatremia which has remained stable and serum creatine holding in the 2.6 to 2.7 range. We did attempt some light diuresis but currently have just been maintaining a even fluid balance w/ both the Na and creatinine stable. On 10/13 she went back to the OR where she underwent: 1. Second stage left percutaneous nephroscopy with removal of stone <2cm 2. Left antegrade ureteroscopy with removal of stone 3. Left Antegrade nephrostogram with interpretation 4. Exchange of left nephrostomy tube. She returned to the ICU w/ good drainage from the left new nephrostomy tube. Recommendations were to keep the current left nephrostomy tube for 1 week, then have her undergo a antegrade nephrostogram. If this confirms adequate drainage then the left nephrostomy tube can be removed. He also recommended a right nephrostogram. We did this prior to d/c and it showed absence of hydronephrosis and patency of the right renal collecting system.  so the right nephrostomy tube was removed. At time of discharge Ms Kozloff is now stable to return to [facility] with the following plan of care as outlined below per active issues.      Discharge Plan by active problems   Acute kidney injury - in setting of obstructive uropathy s/p bilateral percutaneous nephrostomy tubes (placed 8/11 and 8/12). Now s/p left perc nephrolithotomy w/ large 2cm stone removed & insertion of suprapubic cath  Hyponatremia -->stable  Scr/ metabolic acidosis-->stable  Plan:  Needs to return to [facility] interventional radiology in about 7 days for LEFT antegrade nephrostogram, if ureter is patent, then left nephrostomy tube can be removed.  Per Urology the left Urostomy tube can be flushed PRN with sterile normal saline for poor drainage or increased leakage around the nephrostomy site.  Trend chemistry    Replace electrolytes as indicated    Chronic respiratory failure - trach, vent dependent  Bilateral empyema > cultured on admission, both w/ multiple GM neg organisms (Klebsiella CRE), and MDR Providenia; Not a candidate for VATS  Basilar R>L atx  Plan Continue full vent support  Complete 14 days of Avycaz and flagyl (started 10/11)  CAD, h/o cardiomyopathy  RUE swelling 10/12>>US neg for DVT  Plan:  Continue home meds: clonidine, cardura, hydralazine, isordil, & labetalol  Urinary tract infection:  multi drug resistant Klebsiella in her urine, in addition to MDR pseudomonas and Providencia (both of these were sensitive to imipenem). Plan: Complete 14 days of Avycaz and flagyl (started 10/11)  Anemia of chronic disease - also has h/o positive stool guaiac. Hgb was 7.4 on d/c in august Plan:  Continue darbopoietin  Transfuse for hgb <7.0   Right Eye conjunctivitis  Plan: Cont cipro gtts  Sacral decub Measurement: Total area measures 6.5cm x 8cm x 0.2cm with 4cm x 5cm Stage III PrU at left buttock  Wound bed: moist, red with left buttock ulcer 50% dusky dark base (consistent with shear injury).  Drainage (amount, consistency, odor) serous on old dressing  Periwound:macerated Plan: soft silicone foam dressing with twice weekly and PRN changes. Patient to be turned side to side and the supine position avoided. bilateral pressure redistribution boots.  Neuromyelitis Optica  Left sided hemiparesis  Blind  Plan:  RASS goal: 0  Supportive care   Protein calorie malnutrition  P:  diet as per nutrition-->eats renal diet     Significant Hospital tests/ studies  Consults: Urology and infectious disease   10/6 s/p perc left nephrolithotomy and placement of SP cath, tx'd unit of blood in OR  10/7: GNRs growing from Pleur-x tubes bilaterally, right also had few GPCs. Placed both Pleur-x tubes to continuous drainage via sahara.  10/8: no new issues. Awaiting final clture  data. Both CTs flushed as minimal out-put.  10/9: both chest tubes removed  10/11: ID consulted for MDR Klebsiella (carbapenase positive)/ Providencia And pseudomonas(still sensitive to imipenem). Also Pleural fluid w/ Klebsiella w/ ESBL produces and Providencia. Started on AVYCAZ and Flagyl per ID.  10/13: back to OR: 1. . Second stage left percutaneous nephroscopy with removal of stone <2cm 2. Left antegrade ureteroscopy with removal of stone 3. Left Antegrade nephrostogram with interpretation 4. Exchange of left nephrostomy tube   Discharge Exam: BP 160/70  Pulse 90  Temp(Src) 99.1 F (37.3 C) (Oral)  Resp 20  Ht 5\' 1"  (1.549 m)  Wt 73.7 kg (162 lb 7.7 oz)  BMI 30.72 kg/m2  SpO2 100%  General: Chronically ill appearing AAF in NAD  Neuro: Awake, follows commands. Generalized weakness. Left sided hemiparesis w/ right sided UE contractions and very little LE strength.  HEENT: Right eye crusted and erythremic but appears improved when c/w admit, swelling on the right side of her face that is not tender (history of shingles). No new changes  Cardiovascular: rrr  Lungs: Full support, decreased. Pleur-x dressings cd&i  Abdomen: Soft, non-tender + bowel sounds, S/p cath unremarkable.  Musculoskeletal: L sided hemiparesis  GU: SP cath draining. New left perc nephrostomy tube draining bloody drainage.  Skin: Generalized anasarca, stage 2 sacral decub    Labs at discharge Lab Results  Component Value Date   CREATININE 2.77* 11/19/2013   BUN 68* 11/19/2013   NA 127* 11/19/2013   K 4.7 11/19/2013   CL 97 11/19/2013   CO2 16* 11/19/2013   Lab Results  Component Value Date   WBC 10.6* 11/17/2013   HGB 7.7* 11/17/2013   HCT 23.7* 11/17/2013   MCV 89.8 11/17/2013   PLT 188 11/17/2013   Lab Results  Component Value Date   ALT 6 11/13/2013   AST 11 11/13/2013   ALKPHOS 169* 11/13/2013   BILITOT 0.4 11/13/2013   Lab Results  Component Value Date   INR 1.26 11/09/2013   INR 1.12  09/15/2013    Current radiology studies Ir Nephrostogram Right  11/19/2013   CLINICAL DATA:  63 year old female with neuromyelitis optic and bilateral renal stones. She has now undergone bilateral percutaneous nephrostomy placement and bilateral PCNL procedures. Her creatinine has normalized. Right-sided nephrostogram to confirm patency of the right renal collecting system prior to removal of the right percutaneous nephrostomy tube.  EXAM: RIGHT NEPHROSTOGRAM  Date: 11/19/2013  PROCEDURE: 1. Right-sided Antegrade nephrostogram through existing nephrostomy tube 2. Removal of right percutaneous nephrostomy tube Interventional Radiologist:  Sterling Big, MD  ANESTHESIA/SEDATION: None required  FLUOROSCOPY TIME:  24 seconds  CONTRAST:  15 mL Omnipaque 300  TECHNIQUE: Informed consent was obtained from the patient following explanation of the procedure, risks, benefits and alternatives. The patient understands, agrees and consents for the procedure. All questions were addressed. A time out was performed.  A gentle hand injection of contrast material through the existing right percutaneous nephrostomy  tube demonstrates that the tube has pulled back into the lower pole infundibulum. However, there is no evidence of hydronephrosis and a urine passes freely down the right ureter and into the bladder.  Therefore, the tube was transected and a gently removed. A bandage was placed.  IMPRESSION: 1. Right antegrade percutaneous nephrostogram confirms absence of hydronephrosis and patency of the right renal collecting system. 2. The right percutaneous nephrostomy tube was removed. Signed,  Sterling BigHeath K. McCullough, MD  [facility]  [facility]   Electronically Signed   By: Malachy MoanHeath  McCullough M.D.   On: 11/19/2013 11:18    Disposition:  62-Long Term Care      Discharge Instructions   Diet general    Complete by:  As directed      Increase activity slowly    Complete  by:  As directed             Medication List    STOP taking these medications       PRESCRIPTION MEDICATION      TAKE these medications       acetaminophen 325 MG tablet  Commonly known as:  TYLENOL  Take 2 tablets (650 mg total) by mouth every 6 (six) hours as needed for fever, headache or mild pain.     antiseptic oral rinse 0.05 % Liqd solution  Commonly known as:  CPC / CETYLPYRIDINIUM CHLORIDE 0.05%  7 mLs by Mouth Rinse route QID.     ascorbic acid 500 MG tablet  Commonly known as:  VITAMIN C  Take 500 mg by mouth 2 (two) times daily. For wound healing     Calcium Acetate 667 MG Tabs  Take 1,334 mg by mouth 3 (three) times daily with meals.     carbamazepine 100 MG chewable tablet  Commonly known as:  TEGRETOL  Chew 100 mg by mouth 2 (two) times daily.     ceftazidime-avibactam 0.94 g in dextrose 5 % 50 mL  Inject 0.94 g into the vein every 12 (twelve) hours.     chlorhexidine 0.12 % solution  Commonly known as:  PERIDEX  Use as directed 15 mLs in the mouth or throat 2 (two) times daily.     ciprofloxacin 0.3 % ophthalmic solution  Commonly known as:  CILOXAN  Place 2 drops into the right eye 4 (four) times daily. Administer 1 drop, every 2 hours, while awake, for 2 days. Then 1 drop, every 4 hours, while awake, for the next 5 days.     cloNIDine 0.3 MG tablet  Commonly known as:  CATAPRES  Take 0.3 mg by mouth every 8 (eight) hours. For blood pressure     darbepoetin 40 MCG/0.4ML Soln injection  Commonly known as:  ARANESP  Inject 40 mcg into the skin every 7 (seven) days. Saturday     doxazosin 4 MG tablet  Commonly known as:  CARDURA  Take 4 mg by mouth 2 (two) times daily.     DSS 100 MG Caps  Take 100 mg by mouth 2 (two) times daily.     famotidine 20 MG tablet  Commonly known as:  PEPCID  Take 20 mg by mouth every morning.     feeding supplement (ENSURE) Pudg  Take 1 Container by mouth 3 (three) times daily between meals.     ferrous  sulfate 325 (65 FE) MG tablet  Take 325 mg by mouth 2 (two) times daily.     hydrALAZINE 100 MG tablet  Commonly known as:  APRESOLINE  Take 100 mg by mouth every 8 (eight) hours.     isosorbide dinitrate 20 MG tablet  Commonly known as:  ISORDIL  Take 20 mg by mouth every 8 (eight) hours. For blood pressure     labetalol 300 MG tablet  Commonly known as:  NORMODYNE  Take 600 mg by mouth 2 (two) times daily. For blood pressure     levETIRAcetam 500 MG tablet  Commonly known as:  KEPPRA  Take 500 mg by mouth 2 (two) times daily.     metroNIDAZOLE 5-0.79 MG/ML-% IVPB  Commonly known as:  FLAGYL  Inject 100 mLs (500 mg total) into the vein every 8 (eight) hours.     MULTIVITAMIN & MINERAL PO  Take 1 tablet by mouth daily.     omeprazole 20 MG capsule  Commonly known as:  PRILOSEC  Take 20 mg by mouth daily.     pregabalin 100 MG capsule  Commonly known as:  LYRICA  Take 100 mg by mouth every morning.     PRESCRIPTION MEDICATION  Take 74 mLs by mouth daily. In the evening, protein modular     promethazine 25 MG tablet  Commonly known as:  PHENERGAN  Take 12.5 mg by mouth every 6 (six) hours as needed for nausea or vomiting.     ROXICODONE 5 MG immediate release tablet  Generic drug:  oxyCODONE  Take 5 mg by mouth every 4 (four) hours as needed for moderate pain or severe pain.     simethicone 80 MG chewable tablet  Commonly known as:  MYLICON  Chew 80 mg by mouth every 6 (six) hours as needed for flatulence.     tiZANidine 2 MG tablet  Commonly known as:  ZANAFLEX  Take 2 mg by mouth every 8 (eight) hours.     zinc sulfate 220 MG capsule  Take 220 mg by mouth every morning. For wound healing         Discharged Condition: stable  Physician Statement:   The Patient was personally examined, the discharge assessment and plan has been personally reviewed and I agree with ACNP Babcock's assessment and plan. > 30 minutes of time have been dedicated to discharge  assessment, planning and discharge instructions.   SignedAnders Simmonds 11/19/2013, 11:31 AM   Coralyn Helling, MD [facility] 11/19/2013, 12:08 PM Pager:  [phone] After 3pm call: [phone]

## 2013-11-18 NOTE — Progress Notes (Signed)
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 She has lived in [facility] for the past 4 years. [facility] after an acute hospitalization for acute renal failure in setting of obstructive uropathy and urinary tract sepsis. During this time she required bilateral urostomy tubes placed 8/11 and 8/12. She presents 10/5 being brought in by urology for follow up of her obstructive uropathy, left percutaneous nephrolithotomy and possible suprapubic tube. PCCM asked to assist w/ supportive medical care.   STUDIES:  CT abd/pelvis 10/7: The number of left kidney stones are decreased compared to previous exam. The previously noted left mid  ureter stone is unchanged. There is a double pigtail left ureteral catheter without evidence of significant left hydronephrosis.  SIGNIFICANT EVENTS: 10/6  s/p perc left nephrolithotomy and placement of SP cath, tx'd unit of blood in OR 10/7: GNRs growing from Pleur-x tubes bilaterally, right also had few GPCs. Placed both Pleur-x tubes to continuous drainage via sahara.  10/8: no new issues. Awaiting final clture data. Both CTs flushed as minimal out-put.  10/9: both chest tubes removed 10/11: ID consulted for MDR Klebsiella (carbapenase positive)/ Providencia  And pseudomonas(still sensitive to imipenem). Also Pleural fluid w/ Klebsiella w/ ESBL produces and Providencia. Started on AVYCAZ and Flagyl per ID.  10/13: back to OR: 1. . Second stage left percutaneous nephroscopy with removal of stone <2cm 2. Left  antegrade ureteroscopy with removal of stone 3. Left Antegrade nephrostogram with interpretation 4. Exchange of left nephrostomy tube   SUBJECTIVE:   No sig change. Ready for d/c per urology.  VITAL SIGNS: Temp:  [95.1 F (35.1 C)-97.8 F (36.6 C)] 97.3 F (36.3 C) (10/14 0358) Pulse Rate:  [63-83] 74 (10/14 0600) Resp:  [16-25] 20 (10/14 0600) BP: (112-160)/(56-84) 136/69 mmHg (10/14 0600) SpO2:  [97 %-100 %] 100 % (10/14 0600) FiO2 (%):  [30 %] 30 % (10/14 0759) Weight:  [162 lb 11.2 oz (73.8 kg)] 162 lb 11.2 oz (73.8 kg) (10/14 0239)  VENTILATOR SETTINGS: Vent Mode:  [-] PRVC FiO2 (%):  [30 %] 30 % Set Rate:  [20 bmp] 20 bmp Vt Set:  [500 mL] 500 mL PEEP:  [5 cmH20] 5 cmH20 Plateau Pressure:  [32 cmH20-39 cmH20] 33 cmH20  INTAKE / OUTPUT:  Intake/Output Summary (Last 24 hours) at 11/18/13 0826 Last data filed at 11/18/13 0600  Gross per 24 hour  Intake 2007.5 ml  Output    320 ml  Net 1687.5 ml   PHYSICAL EXAMINATION: General:  Chronically ill appearing AAF in NAD Neuro:  Awake, follows commands. Generalized weakness. Left sided hemiparesis w/ right sided UE contractions and very little LE strength.  HEENT:  Right eye crusted and erythremic but appears improved when c/w admit, swelling on the right side of her face that is not tender (history of shingles). No new changes  Cardiovascular:  rrr Lungs:  Full support, decreased. Pleur-x dressings cd&i  Abdomen:  Soft, non-tender + bowel sounds, S/p cath unremarkable.  Musculoskeletal:  L sided hemiparesis  GU: SP cath draining. New left  perc nephrostomy tube draining bloody drainage.  Skin:  Generalized anasarca, stage 2 sacral decub   LABS:  CBC  Recent Labs Lab 11/15/13 0555 11/16/13 0325 11/17/13 0500  WBC 16.7* 14.3* 10.6*  HGB 8.1* 8.1* 7.7*  HCT 24.4* 24.5* 23.7*  PLT 207 194 188    BMET  Recent Labs Lab 11/16/13 1800 11/17/13 0040 11/18/13 0238  NA 128* 127* 129*  K 4.7 4.7 4.6  CL 97 96 97   CO2 16* 17* 16*  BUN 61* 65* 64*  CREATININE 2.79* 2.89* 2.75*  GLUCOSE 142* 112* 104*   Electrolytes  Recent Labs Lab 11/15/13 0555 11/16/13 0325 11/16/13 1800 11/17/13 0040 11/18/13 0238  CALCIUM 7.9* 8.2* 7.7* 8.0* 7.9*  MG 2.1 2.2  --   --   --   PHOS 4.6 4.8*  --   --   --    Liver Enzymes  Recent Labs Lab 11/13/13 0544  AST 11  ALT 6  ALKPHOS 169*  BILITOT 0.4  ALBUMIN 1.4*   Imaging Dg C-arm Gt 120 Min-no Report  11/17/2013   CLINICAL DATA: left perc   C-ARM GT 120 MINUTE  Fluoroscopy was utilized by the requesting physician.  No radiographic  interpretation.    ASSESSMENT / PLAN:  PULMONARY Trach (Chronic)  A: Chronic respiratory failure - trach, vent dependent Bilateral empyema > cultured on admission, both w/ multiple GM neg organisms (Klebsiella CRE), and MDR Providenia; Not a candidate for VATS Basilar R>L atx  CXR stable w/ bibasilar atx. We removed the CTs on 10/9 given concern that these may actually be colonization sites and above may represent bacterial colonization and not active infection.   P:   Full vent support (vent dependent at [facility]). PAD protocol. PRN CXRs  CARDIOVASCULAR RUE PICC (?8/8) >> A:  HTN  CAD, h/o cardiomyopathy  RUE swelling 10/12>>US neg for DVT  P:  Continue home meds: clonidine, cardura, hydralazine, isordil, & labetalol Tele  RENAL A:  Acute kidney injury - in setting of obstructive uropathy s/p bilateral percutaneous nephrostomy tubes (placed 8/11 and 8/12). Now s/p left perc nephrolithotomy w/ large 2cm stone removed & insertion of suprapubic cath  Hyponatremia  -->stable Scr/ metabolic acidosis-->stable P:   Trend chemistry  Replace electrolytes as indicated IR consult for right antegrade nephrostogram, if ureter patent remove. Post op wound care per urology   GASTROINTESTINAL A:   Protein calorie malnutrition P:   TF as per nutrition  HEMATOLOGIC A:   Anemia of chronic disease - also has  h/o positive stool guaiac.  Hgb was 7.4 on d/c in august. Did get 1 unit of blood during that stay.  S/P unit PRBC's in OR 10/6 and 10/14 P:  Continue darbopoietin No heparin for now, PAS Transfuse for hgb <7.0   INFECTIOUS A:   Empyema bilaterally vs pleurx tube colonization > present on admission cultures: CRE Klebsiella and MDR  Providencia. CTs removed 10/9 UT sepsis: Klebsiella (ESBL), providencia and pseudomonas (sens to imipenem) Right eye conjunctivitis Sacral decub  P:   Primaxin 10/5>>>10/11 Vanc 10/5 >>>10/9 Flagyl 10/11 (per ID; recommends 14d)  AVYCAZ (per ID) 10/11 (recommends 14d) WOC  Isolation Cipro eye gtt's  ENDOCRINE A:   No acute process P:   Trend am chemistries   NEUROLOGIC A:   Neuromyelitis Optica Left sided hemiparesis Blind P:   RASS goal: 0 Supportive care   Family updated:  Contact person: Calie, [place] Sister [phone]. Marland Kitchen Have not been able to contact family. Per  the patient her sister works in schools and visits on weekends.   Interdisciplinary Family Meeting v Palliative Care Meeting: have not been able to meet w/ pt's family. She desires full medical care at this point.   TODAY'S SUMMARY:  Will see if we can get the right antegrade nephrostogram today and d/c right SP cath. Ready to d/c to [facility].   Anders Simmonds ACNP-BC [facility] Pulmonary/Critical Care Pager # [phone] OR # [phone] if no answer     11/18/2013, 8:26 AM  Reviewed above, examined.  Will see if she can get nephrostogram today on Rt.  If not, then will arrange for [facility] transfer when bed available.  Coralyn Helling, MD [facility] Pulmonary/Critical Care 11/18/2013, 10:36 AM Pager:  [phone] After 3pm call: [phone]

## 2013-11-18 NOTE — Progress Notes (Signed)
CSW continuing to follow for disposition planning.  Pt admitted from [facility] and plan is for pt to return upon discharge.  CSW spoke with RN who reports that pt to get nephrostogram today on right at 4 pm and MD anticipates discharge tomorrow.  CSW updated [facility] who confirmed bed availability for tomorrow.  CSW and RN updated pt sister at bedside.   CSW to continue to follow to provide support and assist with pt transfer back to [facility] when medically stable.  Loletta Specter, MSW, LCSW Clinical Social Work [phone]

## 2013-11-18 NOTE — Progress Notes (Signed)
NUTRITION FOLLOW UP  Intervention:   -Continue to encourage PO intake; consider liberalizing diet to Low Sodium w/1200 ml fluid restriction -Continue w/ Ensure Pudding po TID, each supplement provides 170 kcal and 4 grams of protein -Continue Pro-stat once daily to provide 100 kcal, 15 gram protien -RD to follow  Nutrition Dx:   Inadequate oral intake related to poor appetite as evidenced by <25% meal intake; progressing   Goal:   Pt to meet >/= 90% of their estimated nutrition needs; progressing  Monitor:   Total protein/energy intake, labs, weights, renal profile, supplement tolerance  Assessment:   10/06: - S/p left nephrolithotomy this morning  - Met with pt who reports eating 3 meals/day PTA with good appetite  - Said she would drink 1 Ensure/day  - Per telephone conversation with [facility] Staff, pt was on a no added salt/mechanical soft diet with thin liquids and was a very picky eater - enjoys soups  - PO intake since admission has been 10% of her meals  10/14: -Per discussion with RN, pt eating approximately 50% of meals. Consuming on average two Ensure puddings daily and one Pro-Stat supplement after dinner. Supplement regimen providing approximately 440 kcal, 23 gram protein -Consider liberalizing diet to Low Sodium w/1200 ml fluid restriction to encourage PO intake -Pt s/p nephrostomy tube placement on 10/13, 100 ml output -Weight increased 10 kg from admit date (10/06), likely d/t fluid accumulation as pt with +3 RUE, LUE, RLE, LLE edema and +2 facial edema  Height: Ht Readings from Last 1 Encounters:  11/12/13 5' 1" (1.549 m)    Weight Status:   Wt Readings from Last 1 Encounters:  11/18/13 162 lb 11.2 oz (73.8 kg)  11/10/13 139 lb  Re-estimated needs:  - S/p left nephrolithotomy this morning  - Met with pt who reports eating 3 meals/day PTA with good appetite  - Said she would drink 1 Ensure/day  - Per telephone conversation with [facility] Staff, pt was on a  no added salt/mechanical soft diet with thin liquids and was a very picky eater - enjoys soups  - PO intake since admission has been 10% of her meals   Skin: stg 2 coccyx pressure ulcer  Diet Order: Renal with 1200 ml fluid restriction   Intake/Output Summary (Last 24 hours) at 11/18/13 1334 Last data filed at 11/18/13 1200  Gross per 24 hour  Intake 1044.5 ml  Output    320 ml  Net  724.5 ml    Last BM: 10/13   Labs:   Recent Labs Lab 11/15/13 0555 11/16/13 0325 11/16/13 1800 11/17/13 0040 11/18/13 0238  NA 125* 127* 128* 127* 129*  K 4.5 4.7 4.7 4.7 4.6  CL 96 96 97 96 97  CO2 16* 17* 16* 17* 16*  BUN 56* 59* 61* 65* 64*  CREATININE 2.91* 2.96* 2.79* 2.89* 2.75*  CALCIUM 7.9* 8.2* 7.7* 8.0* 7.9*  MG 2.1 2.2  --   --   --   PHOS 4.6 4.8*  --   --   --   GLUCOSE 94 97 142* 112* 104*    CBG (last 3)  No results found for this basename: GLUCAP,  in the last 72 hours  Scheduled Meds: . antiseptic oral rinse  7 mL Mouth Rinse QID  . calcium acetate  1,334 mg Oral TID WC  . carbamazepine  100 mg Oral BID  . ceftazidime avibactam (AVYCAZ) IVPB  0.94 g Intravenous Q12H  . chlorhexidine  15 mL Mouth Rinse  BID  . ciprofloxacin  2 drop Right Eye QID  . cloNIDine  0.3 mg Oral TID  . darbepoetin (ARANESP) injection - NON-DIALYSIS  40 mcg Subcutaneous Q Sat-1800  . docusate  100 mg Per Tube BID  . doxazosin  4 mg Oral Q12H  . famotidine  20 mg Oral Daily  . feeding supplement (ENSURE)  1 Container Oral TID BM  . feeding supplement (PRO-STAT SUGAR FREE 64)  30 mL Oral QPC supper  . ferrous sulfate  325 mg Oral BID WC  . hydrALAZINE  100 mg Oral 4 times per day  . isosorbide dinitrate  20 mg Oral TID  . labetalol  600 mg Oral BID  . levETIRAcetam  500 mg Oral BID  . metronidazole  500 mg Intravenous Q8H  . multivitamin with minerals  1 tablet Oral Daily  . pantoprazole sodium  40 mg Per Tube Daily  . pregabalin  100 mg Oral Daily  . tiZANidine  2 mg Oral TID     Continuous Infusions: . dextrose 5 % and 0.45% NaCl Stopped (11/17/13 0839)  . dextrose 5 % and 0.45% NaCl 10 mL/hr at 11/17/13 [place] LDN Clinical Dietitian [phone]

## 2013-11-18 NOTE — Progress Notes (Signed)
ANTIBIOTIC CONSULT NOTE - FOLLOW UP  Pharmacy Consult for Avycaz Indication: UTI, empyema  Allergies  Allergen Reactions  . Neurontin [Gabapentin] Other (See Comments)    Per MAR, unknown  . Xanax [Alprazolam] Other (See Comments)    Per MAR, unknown    Patient Measurements: Height: 5\' 1"  (154.9 cm) Weight: 162 lb 11.2 oz (73.8 kg) IBW/kg (Calculated) : 47.8  Vital Signs: Temp: 97.3 F (36.3 C) (10/14 0358) Temp Source: Oral (10/14 0358) BP: 136/69 mmHg (10/14 0600) Pulse Rate: 74 (10/14 0600) Intake/Output from previous day: 10/13 0701 - 10/14 0700 In: 2025.5 [P.O.:360; I.V.:930.5; Blood:335; IV Piggyback:400] Out: 495 [Urine:495]  Labs:  Recent Labs  11/16/13 0325 11/16/13 1800 11/17/13 0040 11/17/13 0500 11/18/13 0238  WBC 14.3*  --   --  10.6*  --   HGB 8.1*  --   --  7.7*  --   PLT 194  --   --  188  --   CREATININE 2.96* 2.79* 2.89*  --  2.75*   Estimated Creatinine Clearance: 19 ml/min (by C-G formula based on Cr of 2.75). No results found for this basename: VANCOTROUGH, Leodis Binet, VANCORANDOM, GENTTROUGH, GENTPEAK, GENTRANDOM, TOBRATROUGH, TOBRAPEAK, TOBRARND, AMIKACINPEAK, AMIKACINTROU, AMIKACIN,  in the last 72 hours     Assessment: 26 yoF admitted 10/5 by urology for follow up of percutaneous nephrostomy tubes with nephrolithotomy w/ suprapubic tube placement 10/6. Has lived at [facility] for past 4 years. Previous admit in Aug for ARF in setting of obstructive uropathy and urosepsis, s/p bilateral urostomy tubes placement.  She is now found to bilateral empyema.  Pharmacy was initially consulted to dose Primaxin, but due to resistance pattern, consult is changed to Avycaz (Ceftazidime and avibactam) and metronidazole per ID.  Anti-infectives: 10/5 >> vancomycin >> 10/9 10/5 >> primaxin >> 10/11 10/11 >> Avycaz >>  10/11 >> metronidazole >>  Today, 10/14  Tmax: afebrile  WBCs: elevated but improving, 10.6  Renal: CKD IV, SCr 2.75, CrCl ~ 23  ml/min  Positive culture results available: 10/5 urine (cath): > 100k KLEBSIELLA, PROVIDENCIA, PSEUDOMONAS  KLEBSIELLA (pan-resistant)  PROVIDENCIA (Sensitive- Amikacin, Aztreonam, Gent/Tobra, Primaxin)  PSEUDOMONAS (sensitivities tested twice, Sensitive- Primaxin only)  10/5 L pleural fluid: PROVIDENCIA, KLEBSIELLA  PROVIDENCIA (Sensitive: cefepime, ceftaz, imi, zosyn)  KLEBSIELLA (ESBL; intermediate to gent, otherwise pan-resistant except colistin per ID note)  Today is day #4 of recommended 14 day course of Avycaz 0.94g IV q12h and Flagyl 500mg  q8h for MDR Providencia and Kleb bilateral empyema + CRE UTI (s/p 6 days Primaxin).  ID asked micro to test Avycaz and Tygacil sensitivities.  S/p OR 10/6 for removal of L kidney stone and suprapubic catheter placement, remaining stones removed in OR 10/13. Has nephroureteral catheter still in place for 1 week. Cipro gtt for conjuctivitis.   Goal of Therapy:  Appropriate abx dosing, eradication of infection.   Plan:   Continue Avycaz 0.94 g IV q12h  Ceftazidime 0.75 g + avibactam 0.19g  Usual dose of Avycaz is 2.5mg  IV q12h, but for CrCl 16 to 30 mL/min, using reduced dose.  Follow up renal function for dose adjustments.  Clance Boll, PharmD, BCPS Pager: [phone] 11/18/2013 8:14 AM

## 2013-11-18 NOTE — Progress Notes (Signed)
Patient ID: [name], female   DOB: 01/04/1951, 63 y.o.   MRN: [id number] 1 Day Post-Op  Subjective: No issues after second look PCNL. Both nephrostomy tubes draining well, left tube clearing nicely. No changes.   ROS:  Review of Systems  Unable to perform ROS: intubated    Anti-infectives: Anti-infectives   Start     Dose/Rate Route Frequency Ordered Stop   11/15/13 1500  ceftazidime-avibactam (AVYCAZ) 0.94 g in dextrose 5 % 50 mL IVPB     0.94 g 25 mL/hr over 2 Hours Intravenous Every 12 hours 11/15/13 1427     11/15/13 1300  metroNIDAZOLE (FLAGYL) IVPB 500 mg     500 mg 100 mL/hr over 60 Minutes Intravenous Every 8 hours 11/15/13 1259     11/14/13 1800  vancomycin (VANCOCIN) IVPB 750 mg/150 ml premix  Status:  Discontinued     750 mg 150 mL/hr over 60 Minutes Intravenous Every 48 hours 11/12/13 2007 11/13/13 0938   11/10/13 1800  vancomycin (VANCOCIN) 500 mg in sodium chloride 0.9 % 100 mL IVPB  Status:  Discontinued     500 mg 100 mL/hr over 60 Minutes Intravenous Every 24 hours 11/09/13 1930 11/12/13 2007   11/10/13 0600  ciprofloxacin (CIPRO) IVPB 400 mg  Status:  Discontinued     400 mg 200 mL/hr over 60 Minutes Intravenous 60 min pre-op 11/09/13 1403 11/09/13 1521   11/10/13 0400  imipenem-cilastatin (PRIMAXIN) 250 mg in sodium chloride 0.9 % 100 mL IVPB  Status:  Discontinued     250 mg 200 mL/hr over 30 Minutes Intravenous Every 12 hours 11/09/13 1930 11/15/13 1259   11/09/13 1700  vancomycin (VANCOCIN) IVPB 1000 mg/200 mL premix     1,000 mg 200 mL/hr over 60 Minutes Intravenous  Once 11/09/13 1617 11/09/13 1822   11/09/13 1600  imipenem-cilastatin (PRIMAXIN) 500 mg in sodium chloride 0.9 % 100 mL IVPB     500 mg 200 mL/hr over 30 Minutes Intravenous  Once 11/09/13 1524 11/09/13 1623      Current Facility-Administered Medications  Medication Dose Route Frequency Provider Last Rate Last Dose  . 0.9 %  sodium chloride infusion  250 mL Intravenous PRN Simonne Martinet, NP   250 mL at 11/16/13 1522  . acetaminophen (TYLENOL) tablet 650 mg  650 mg Oral Q6H PRN Alyson Reedy, MD   650 mg at 11/13/13 0000  . antiseptic oral rinse (CPC / CETYLPYRIDINIUM CHLORIDE 0.05%) solution 7 mL  7 mL Mouth Rinse QID Simonne Martinet, NP   7 mL at 11/18/13 0455  . calcium acetate (PHOSLO) capsule 1,334 mg  1,334 mg Oral TID WC Heloise Purpura, MD   1,334 mg at 11/18/13 0829  . carbamazepine (TEGRETOL) chewable tablet 100 mg  100 mg Oral BID Heloise Purpura, MD   100 mg at 11/17/13 2158  . ceftazidime-avibactam (AVYCAZ) 0.94 g in dextrose 5 % 50 mL IVPB  0.94 g Intravenous Q12H Christine E Shade, RPH   0.94 g at 11/18/13 0241  . chlorhexidine (PERIDEX) 0.12 % solution 15 mL  15 mL Mouth Rinse BID Simonne Martinet, NP   15 mL at 11/18/13 0831  . ciprofloxacin (CILOXAN) 0.3 % ophthalmic solution 2 drop  2 drop Right Eye QID Simonne Martinet, NP   2 drop at 11/17/13 2201  . cloNIDine (CATAPRES) tablet 0.3 mg  0.3 mg Oral TID Heloise Purpura, MD   0.3 mg at 11/17/13 2159  . darbepoetin (ARANESP) injection 40 mcg  40 mcg Subcutaneous Q Sat-1800 Heloise PurpuraLester Borden, MD   40 mcg at 11/14/13 1851  . dextrose 5 %-0.45 % sodium chloride infusion   Intravenous Continuous Simonne MartinetPeter E Babcock, NP      . dextrose 5 %-0.45 % sodium chloride infusion   Intravenous Continuous Coralyn HellingVineet Sood, MD 10 mL/hr at 11/17/13 1212    . docusate (COLACE) 50 MG/5ML liquid 100 mg  100 mg Per Tube BID Otho Bellowserri L Green, RPH   100 mg at 11/17/13 2157  . doxazosin (CARDURA) tablet 4 mg  4 mg Oral Q12H Heloise PurpuraLester Borden, MD   4 mg at 11/17/13 2159  . famotidine (PEPCID) tablet 20 mg  20 mg Oral Daily Heloise PurpuraLester Borden, MD   20 mg at 11/16/13 1046  . feeding supplement (ENSURE) (ENSURE) pudding 1 Container  1 Container Oral TID BM Tenny CrawHeather S Winkler, RD   1 Container at 11/17/13 2000  . feeding supplement (PRO-STAT SUGAR FREE 64) liquid 30 mL  30 mL Oral QPC supper Heloise PurpuraLester Borden, MD   30 mL at 11/17/13 1818  . fentaNYL (SUBLIMAZE) injection  100 mcg  100 mcg Intravenous Q15 min PRN Simonne MartinetPeter E Babcock, NP   100 mcg at 11/10/13 1504  . fentaNYL (SUBLIMAZE) injection 100 mcg  100 mcg Intravenous Q2H PRN Simonne MartinetPeter E Babcock, NP   100 mcg at 11/17/13 2152  . ferrous sulfate tablet 325 mg  325 mg Oral BID WC Heloise PurpuraLester Borden, MD   325 mg at 11/18/13 [id number]  . hydrALAZINE (APRESOLINE) injection 10 mg  10 mg Intravenous Q4H PRN Simonne MartinetPeter E Babcock, NP   10 mg at 11/11/13 1104  . hydrALAZINE (APRESOLINE) tablet 100 mg  100 mg Oral 4 times per day Simonne MartinetPeter E Babcock, NP   100 mg at 11/18/13 0501  . isosorbide dinitrate (ISORDIL) tablet 20 mg  20 mg Oral TID Heloise PurpuraLester Borden, MD   20 mg at 11/17/13 2159  . labetalol (NORMODYNE) tablet 600 mg  600 mg Oral BID Heloise PurpuraLester Borden, MD   600 mg at 11/17/13 2158  . levETIRAcetam (KEPPRA) 100 MG/ML solution 500 mg  500 mg Oral BID Dannielle Huhustin George Zeigler, RPH   500 mg at 11/17/13 2156  . metroNIDAZOLE (FLAGYL) IVPB 500 mg  500 mg Intravenous Q8H Randall Hissornelius N Van Dam, MD   500 mg at 11/18/13 0455  . multivitamin with minerals tablet 1 tablet  1 tablet Oral Daily Heloise PurpuraLester Borden, MD   1 tablet at 11/16/13 1045  . oxyCODONE (Oxy IR/ROXICODONE) immediate release tablet 5 mg  5 mg Oral Q4H PRN Heloise PurpuraLester Borden, MD   5 mg at 11/16/13 1106  . pantoprazole sodium (PROTONIX) 40 mg/20 mL oral suspension 40 mg  40 mg Per Tube Daily Otho Bellowserri L Green, RPH   40 mg at 11/16/13 1030  . pregabalin (LYRICA) capsule 100 mg  100 mg Oral Daily Heloise PurpuraLester Borden, MD   100 mg at 11/16/13 1045  . tiZANidine (ZANAFLEX) tablet 2 mg  2 mg Oral TID Heloise PurpuraLester Borden, MD   2 mg at 11/17/13 2158     Objective: Vital signs in last 24 hours: Temp:  [95.1 F (35.1 C)-97.8 F (36.6 C)] 97.3 F (36.3 C) (10/14 0358) Pulse Rate:  [63-83] 79 (10/14 0900) Resp:  [16-25] 21 (10/14 0900) BP: (112-160)/(56-84) 153/72 mmHg (10/14 0900) SpO2:  [97 %-100 %] 100 % (10/14 0900) FiO2 (%):  [30 %] 30 % (10/14 0759) Weight:  [73.8 kg (162 lb 11.2 oz)] 73.8 kg (162 lb 11.2 oz) (10/14  1030)  Intake/Output from previous day: 10/13 0701 - 10/14 0700 In: 2025.5 [P.O.:360; I.V.:930.5; Blood:335; IV Piggyback:400] Out: 495 [Urine:495] Intake/Output this shift: Total I/O In: 130 [P.O.:120; I.V.:10] Out: -    Physical Exam GU: left NUS draining light pink urine  Lab Results:   Recent Labs  11/16/13 0325 11/17/13 0500  WBC 14.3* 10.6*  HGB 8.1* 7.7*  HCT 24.5* 23.7*  PLT 194 188   BMET  Recent Labs  11/17/13 0040 11/18/13 0238  NA 127* 129*  K 4.7 4.6  CL 96 97  CO2 17* 16*  GLUCOSE 112* 104*  BUN 65* 64*  CREATININE 2.89* 2.75*  CALCIUM 8.0* 7.9*   PT/INR No results found for this basename: LABPROT, INR,  in the last 72 hours ABG No results found for this basename: PHART, PCO2, PO2, HCO3,  in the last 72 hours  Studies/Results: Dg C-arm Gt 120 Min-no Report  11/17/2013   CLINICAL DATA: left perc   C-ARM GT 120 MINUTE  Fluoroscopy was utilized by the requesting physician.  No radiographic  interpretation.    I have reviewed her recent Critical care and ID notes as well has her labs and recent x-ray reports.   Assessment: s/p Procedure(s): LEFT NEPHROLITHOTOMY PERCUTANEOUS INSERTION OF SUPRAPUBIC CATHETER   Plan: 1. Patient will need bilateral antegrade nephrostograms in 1 week at which point the tubes can be removed if spontaneous antegrade drainage is noted. This was discussed with the primary team. 2. Stable for discharge from urologic standpoint.    LOS: 9 days    Laural Benes, Rubee Vega C 11/18/2013

## 2013-11-18 NOTE — Significant Event (Signed)
Sister at bedside. Current events and plan of care discussed.   Anders Simmonds ACNP-BC [facility] Pulmonary/Critical Care Pager # [phone] OR # [phone] if no answer

## 2013-11-19 ENCOUNTER — Inpatient Hospital Stay (HOSPITAL_COMMUNITY): Payer: Medicare Other

## 2013-11-19 DIAGNOSIS — A498 Other bacterial infections of unspecified site: Secondary | ICD-10-CM

## 2013-11-19 DIAGNOSIS — Z8619 Personal history of other infectious and parasitic diseases: Secondary | ICD-10-CM

## 2013-11-19 DIAGNOSIS — L89150 Pressure ulcer of sacral region, unstageable: Secondary | ICD-10-CM

## 2013-11-19 DIAGNOSIS — N179 Acute kidney failure, unspecified: Secondary | ICD-10-CM

## 2013-11-19 LAB — BASIC METABOLIC PANEL
ANION GAP: 14 (ref 5–15)
BUN: 68 mg/dL — AB (ref 6–23)
CALCIUM: 7.9 mg/dL — AB (ref 8.4–10.5)
CO2: 16 mEq/L — ABNORMAL LOW (ref 19–32)
CREATININE: 2.77 mg/dL — AB (ref 0.50–1.10)
Chloride: 97 mEq/L (ref 96–112)
GFR calc non Af Amer: 17 mL/min — ABNORMAL LOW (ref >90)
GFR, EST AFRICAN AMERICAN: 20 mL/min — AB (ref >90)
Glucose, Bld: 104 mg/dL — ABNORMAL HIGH (ref 70–99)
Potassium: 4.7 mEq/L (ref 3.7–5.3)
Sodium: 127 mEq/L — ABNORMAL LOW (ref 137–147)

## 2013-11-19 LAB — BODY FLUID CULTURE
Gram Stain: NONE SEEN
Special Requests: NORMAL

## 2013-11-19 MED ORDER — CETYLPYRIDINIUM CHLORIDE 0.05 % MT LIQD: 7.0000 mL | Freq: Four times a day (QID) | OROMUCOSAL | Status: AC

## 2013-11-19 MED ORDER — CIPROFLOXACIN HCL 0.3 % OP SOLN: 2.0000 [drp] | Freq: Four times a day (QID) | OPHTHALMIC | Status: AC

## 2013-11-19 MED ORDER — CEFTAZIDIME-AVIBACTAM 2.5 (2-0.5) G IV SOLR: 0.9400 g | Freq: Two times a day (BID) | INTRAVENOUS | Status: AC

## 2013-11-19 MED ORDER — ACETAMINOPHEN 325 MG PO TABS: 650.0000 mg | ORAL_TABLET | Freq: Four times a day (QID) | ORAL | Status: AC | PRN

## 2013-11-19 MED ORDER — IOHEXOL 300 MG/ML  SOLN
20.0000 mL | Freq: Once | INTRAMUSCULAR | Status: AC | PRN
  Administered 2013-11-19: 20 mL

## 2013-11-19 MED ORDER — ENSURE PUDDING PO PUDG: 1.0000 | Freq: Three times a day (TID) | ORAL | Status: AC

## 2013-11-19 MED ORDER — METRONIDAZOLE IN NACL 5-0.79 MG/ML-% IV SOLN: 500.0000 mg | Freq: Three times a day (TID) | INTRAVENOUS | Status: AC

## 2013-11-19 NOTE — Progress Notes (Signed)
Ventilator changed out for PM to be done in [employer].

## 2013-11-19 NOTE — Progress Notes (Signed)
Patient ID: [name], female   DOB: 1950-11-02, 63 y.o.   MRN: [id number] 2 Days Post-Op  Subjective: No urologic issues. Plan for discharge back to [facility] today after right percutaneous antegrade nephrostogram and possible PCN removal.   ROS:  Review of Systems  Unable to perform ROS: intubated    Anti-infectives: Anti-infectives   Start     Dose/Rate Route Frequency Ordered Stop   11/15/13 1500  ceftazidime-avibactam (AVYCAZ) 0.94 g in dextrose 5 % 50 mL IVPB     0.94 g 25 mL/hr over 2 Hours Intravenous Every 12 hours 11/15/13 1427     11/15/13 1300  metroNIDAZOLE (FLAGYL) IVPB 500 mg     500 mg 100 mL/hr over 60 Minutes Intravenous Every 8 hours 11/15/13 1259     11/14/13 1800  vancomycin (VANCOCIN) IVPB 750 mg/150 ml premix  Status:  Discontinued     750 mg 150 mL/hr over 60 Minutes Intravenous Every 48 hours 11/12/13 2007 11/13/13 0938   11/10/13 1800  vancomycin (VANCOCIN) 500 mg in sodium chloride 0.9 % 100 mL IVPB  Status:  Discontinued     500 mg 100 mL/hr over 60 Minutes Intravenous Every 24 hours 11/09/13 1930 11/12/13 2007   11/10/13 0600  ciprofloxacin (CIPRO) IVPB 400 mg  Status:  Discontinued     400 mg 200 mL/hr over 60 Minutes Intravenous 60 min pre-op 11/09/13 1403 11/09/13 1521   11/10/13 0400  imipenem-cilastatin (PRIMAXIN) 250 mg in sodium chloride 0.9 % 100 mL IVPB  Status:  Discontinued     250 mg 200 mL/hr over 30 Minutes Intravenous Every 12 hours 11/09/13 1930 11/15/13 1259   11/09/13 1700  vancomycin (VANCOCIN) IVPB 1000 mg/200 mL premix     1,000 mg 200 mL/hr over 60 Minutes Intravenous  Once 11/09/13 1617 11/09/13 1822   11/09/13 1600  imipenem-cilastatin (PRIMAXIN) 500 mg in sodium chloride 0.9 % 100 mL IVPB     500 mg 200 mL/hr over 30 Minutes Intravenous  Once 11/09/13 1524 11/09/13 1623      Current Facility-Administered Medications  Medication Dose Route Frequency Provider Last Rate Last Dose  . 0.9 %  sodium chloride infusion  250 mL  Intravenous PRN Simonne Martinet, NP   250 mL at 11/16/13 1522  . acetaminophen (TYLENOL) tablet 650 mg  650 mg Oral Q6H PRN Alyson Reedy, MD   650 mg at 11/13/13 0000  . antiseptic oral rinse (CPC / CETYLPYRIDINIUM CHLORIDE 0.05%) solution 7 mL  7 mL Mouth Rinse QID Simonne Martinet, NP   7 mL at 11/19/13 0400  . calcium acetate (PHOSLO) capsule 1,334 mg  1,334 mg Oral TID WC Heloise Purpura, MD   1,334 mg at 11/18/13 1746  . carbamazepine (TEGRETOL) chewable tablet 100 mg  100 mg Oral BID Heloise Purpura, MD   100 mg at 11/18/13 2207  . ceftazidime-avibactam (AVYCAZ) 0.94 g in dextrose 5 % 50 mL IVPB  0.94 g Intravenous Q12H Winfield Rast, RPH   0.94 g at 11/19/13 0228  . chlorhexidine (PERIDEX) 0.12 % solution 15 mL  15 mL Mouth Rinse BID Simonne Martinet, NP   15 mL at 11/18/13 2005  . ciprofloxacin (CILOXAN) 0.3 % ophthalmic solution 2 drop  2 drop Right Eye QID Simonne Martinet, NP   2 drop at 11/18/13 2257  . cloNIDine (CATAPRES) tablet 0.3 mg  0.3 mg Oral TID Heloise Purpura, MD   0.3 mg at 11/18/13 2207  . darbepoetin (ARANESP) injection 40  mcg  40 mcg Subcutaneous Q Sat-1800 Heloise Purpura, MD   40 mcg at 11/14/13 1851  . dextrose 5 %-0.45 % sodium chloride infusion   Intravenous Continuous Simonne Martinet, NP      . dextrose 5 %-0.45 % sodium chloride infusion   Intravenous Continuous Coralyn Helling, MD 10 mL/hr at 11/17/13 1212    . docusate (COLACE) 50 MG/5ML liquid 100 mg  100 mg Per Tube BID Otho Bellows, RPH   100 mg at 11/18/13 2208  . doxazosin (CARDURA) tablet 4 mg  4 mg Oral Q12H Heloise Purpura, MD   4 mg at 11/18/13 2207  . famotidine (PEPCID) tablet 20 mg  20 mg Oral Daily Heloise Purpura, MD   20 mg at 11/18/13 1047  . feeding supplement (ENSURE) (ENSURE) pudding 1 Container  1 Container Oral TID BM Tenny Craw, RD   1 Container at 11/18/13 2000  . feeding supplement (PRO-STAT SUGAR FREE 64) liquid 30 mL  30 mL Oral QPC supper Heloise Purpura, MD   30 mL at 11/17/13 1818  . fentaNYL  (SUBLIMAZE) injection 100 mcg  100 mcg Intravenous Q15 min PRN Simonne Martinet, NP   100 mcg at 11/10/13 1504  . fentaNYL (SUBLIMAZE) injection 100 mcg  100 mcg Intravenous Q2H PRN Simonne Martinet, NP   100 mcg at 11/18/13 2213  . ferrous sulfate tablet 325 mg  325 mg Oral BID WC Heloise Purpura, MD   325 mg at 11/18/13 1745  . hydrALAZINE (APRESOLINE) injection 10 mg  10 mg Intravenous Q4H PRN Simonne Martinet, NP   10 mg at 11/19/13 6333  . hydrALAZINE (APRESOLINE) tablet 100 mg  100 mg Oral 4 times per day Simonne Martinet, NP   100 mg at 11/19/13 0510  . isosorbide dinitrate (ISORDIL) tablet 20 mg  20 mg Oral TID Heloise Purpura, MD   20 mg at 11/18/13 2208  . labetalol (NORMODYNE) tablet 600 mg  600 mg Oral BID Heloise Purpura, MD   600 mg at 11/18/13 2207  . levETIRAcetam (KEPPRA) 100 MG/ML solution 500 mg  500 mg Oral BID Dannielle Huh, RPH   500 mg at 11/18/13 2208  . metroNIDAZOLE (FLAGYL) IVPB 500 mg  500 mg Intravenous Q8H Randall Hiss, MD   500 mg at 11/19/13 0455  . multivitamin with minerals tablet 1 tablet  1 tablet Oral Daily Heloise Purpura, MD   1 tablet at 11/18/13 1050  . oxyCODONE (Oxy IR/ROXICODONE) immediate release tablet 5 mg  5 mg Oral Q4H PRN Heloise Purpura, MD   5 mg at 11/16/13 1106  . pantoprazole sodium (PROTONIX) 40 mg/20 mL oral suspension 40 mg  40 mg Per Tube Daily Otho Bellows, RPH   40 mg at 11/18/13 1057  . pregabalin (LYRICA) capsule 100 mg  100 mg Oral Daily Heloise Purpura, MD   100 mg at 11/18/13 1049  . tiZANidine (ZANAFLEX) tablet 2 mg  2 mg Oral TID Heloise Purpura, MD   2 mg at 11/18/13 2207     Objective: Vital signs in last 24 hours: Temp:  [97.6 F (36.4 C)-99.1 F (37.3 C)] 99.1 F (37.3 C) (10/14 1600) Pulse Rate:  [71-91] 81 (10/15 0600) Resp:  [20-31] 20 (10/15 0600) BP: (127-178)/(59-84) 178/84 mmHg (10/15 0600) SpO2:  [99 %-100 %] 100 % (10/15 0600) FiO2 (%):  [30 %] 30 % (10/15 0313) Weight:  [73.7 kg (162 lb 7.7 oz)] 73.7 kg (162  lb 7.7  oz) (10/15 0500)  Intake/Output from previous day: 10/14 0701 - 10/15 0700 In: 750 [P.O.:120; I.V.:230; IV Piggyback:400] Out: 485 [Urine:485] Intake/Output this shift: Total I/O In: 360 [I.V.:110; IV Piggyback:250] Out: 210 [Urine:210]   Physical Exam GU: left NUS draining light pink urine  Lab Results:   Recent Labs  11/17/13 0500  WBC 10.6*  HGB 7.7*  HCT 23.7*  PLT 188   BMET  Recent Labs  11/17/13 0040 11/18/13 0238  NA 127* 129*  K 4.7 4.6  CL 96 97  CO2 17* 16*  GLUCOSE 112* 104*  BUN 65* 64*  CREATININE 2.89* 2.75*  CALCIUM 8.0* 7.9*   PT/INR No results found for this basename: LABPROT, INR,  in the last 72 hours ABG No results found for this basename: PHART, PCO2, PO2, HCO3,  in the last 72 hours  Studies/Results: Dg C-arm Gt 120 Min-no Report  11/17/2013   CLINICAL DATA: left perc   C-ARM GT 120 MINUTE  Fluoroscopy was utilized by the requesting physician.  No radiographic  interpretation.    I have reviewed her recent Critical care and ID notes as well has her labs and recent x-ray reports.   Assessment: s/p Procedure(s): LEFT NEPHROLITHOTOMY PERCUTANEOUS INSERTION OF SUPRAPUBIC CATHETER   Plan: 1. R antegrade nephrostogram today with removal if spontaneous drainage. 2. Return next week for left antegrade nephrostogram and PCN removal 3. Ok from urologic standpoint for discharge.    LOS: 10 days    Laural BenesJOHNSON, [place] C 11/19/2013

## 2013-11-19 NOTE — Progress Notes (Signed)
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 She has lived in [facility] for the past 4 years. She was discharged by [facility] in Aug 2015 after an acute hospitalization for acute renal failure in setting of obstructive uropathy and urinary tract sepsis. During this time she required bilateral urostomy tubes placed 8/11 and 8/12. She presents 10/5 being brought in by urology for follow up of her obstructive uropathy, left percutaneous nephrolithotomy and possible suprapubic tube. PCCM asked to assist w/ supportive medical care.   STUDIES:  CT abd/pelvis 10/7: The number of left kidney stones are decreased compared to previous exam. The previously noted left mid  ureter stone is unchanged. There is a double pigtail left ureteral catheter without evidence of significant left hydronephrosis.  SIGNIFICANT EVENTS: 10/6  s/p perc left nephrolithotomy and placement of SP cath, tx'd unit of blood in OR 10/7: GNRs growing from Pleur-x tubes bilaterally, right also had few GPCs. Placed both Pleur-x tubes to continuous drainage via sahara.  10/8: no new issues. Awaiting final clture data. Both CTs flushed as minimal out-put.  10/9: both chest tubes removed 10/11: ID consulted for MDR Klebsiella (carbapenase positive)/ Providencia  And pseudomonas(still sensitive to imipenem). Also Pleural fluid w/ Klebsiella w/ ESBL produces and Providencia. Started on AVYCAZ and Flagyl per ID.  10/13: back to OR: 1. . Second stage left percutaneous nephroscopy with removal of stone <2cm 2. Left  antegrade ureteroscopy with removal of stone 3. Left Antegrade nephrostogram with interpretation 4. Exchange of left nephrostomy tube   SUBJECTIVE:   No sig change. Ready for d/c per urology. Awaiting to see if IR can get fit in this am.   VITAL SIGNS: Temp:  [98.4 F (36.9 C)-99.1 F (37.3 C)] 99.1 F (37.3 C) (10/15 0800) Pulse Rate:  [71-91] 81 (10/15 0600) Resp:  [20-31] 20 (10/15 0600) BP: (127-178)/(59-84) 178/84 mmHg (10/15 0600) SpO2:  [99 %-100 %] 100 % (10/15 0600) FiO2 (%):  [30 %] 30 % (10/15 0820) Weight:  [73.7 kg (162 lb 7.7 oz)] 73.7 kg (162 lb 7.7 oz) (10/15 0500)  VENTILATOR SETTINGS: Vent Mode:  [-] PRVC FiO2 (%):  [30 %] 30 % Set Rate:  [20 bmp] 20 bmp Vt Set:  [500 mL] 500 mL PEEP:  [5 cmH20] 5 cmH20 Plateau Pressure:  [28 cmH20-36 cmH20] 30 cmH20  INTAKE / OUTPUT:  Intake/Output Summary (Last 24 hours) at 11/19/13 0854 Last data filed at 11/19/13 0600  Gross per 24 hour  Intake    740 ml  Output    485 ml  Net    255 ml   PHYSICAL EXAMINATION: General:  Chronically ill appearing AAF in NAD Neuro:  Awake, follows commands. Generalized weakness. Left sided hemiparesis w/ right sided UE contractions and very little LE strength.  HEENT:  Right eye crusted and erythremic but appears improved when c/w admit, swelling on the right side of her face that is not tender (history of shingles). No new changes.  Cardiovascular:  rrr Lungs:  Full support, decreased. Pleur-x dressings cd&i  Abdomen:  Soft, non-tender +  bowel sounds, S/p cath unremarkable.  Musculoskeletal:  L sided hemiparesis  GU: SP cath draining. New left perc nephrostomy tube draining bloody drainage has improved.  Skin:  Generalized anasarca, stage 2 sacral decub   LABS:  CBC  Recent Labs Lab 11/15/13 0555 11/16/13 0325 11/17/13 0500  WBC 16.7* 14.3* 10.6*  HGB 8.1* 8.1* 7.7*  HCT 24.4* 24.5* 23.7*  PLT 207 194 188    BMET  Recent Labs Lab 11/16/13 1800 11/17/13 0040  11/18/13 0238  NA 128* 127* 129*  K 4.7 4.7 4.6  CL 97 96 97  CO2 16* 17* 16*  BUN 61* 65* 64*  CREATININE 2.79* 2.89* 2.75*  GLUCOSE 142* 112* 104*   Electrolytes  Recent Labs Lab 11/15/13 0555 11/16/13 0325 11/16/13 1800 11/17/13 0040 11/18/13 0238  CALCIUM 7.9* 8.2* 7.7* 8.0* 7.9*  MG 2.1 2.2  --   --   --   PHOS 4.6 4.8*  --   --   --    Liver Enzymes  Recent Labs Lab 11/13/13 0544  AST 11  ALT 6  ALKPHOS 169*  BILITOT 0.4  ALBUMIN 1.4*   Imaging No results found. ASSESSMENT / PLAN:  PULMONARY Trach (Chronic)  A: Chronic respiratory failure - trach, vent dependent Bilateral empyema > cultured on admission, both w/ multiple GM neg organisms (Klebsiella CRE), and MDR [employer]; Not a candidate for VATS Basilar R>L atx   P:   Full vent support (vent dependent at [facility]). PAD protocol. PRN CXRs  CARDIOVASCULAR RUE PICC (?8/8) >> A:  HTN  CAD, h/o cardiomyopathy  RUE swelling 10/12>>US neg for DVT  P:  Continue home meds: clonidine, cardura, hydralazine, isordil, & labetalol Tele  RENAL A:  Acute kidney injury - in setting of obstructive uropathy s/p bilateral percutaneous nephrostomy tubes (placed 8/11 and 8/12). Now s/p left perc nephrolithotomy w/ large 2cm stone removed & insertion of suprapubic cath  Hyponatremia  -->stable Scr/ metabolic acidosis-->stable P:   Trend chemistry (will get one now)  Replace electrolytes as indicated IR consult for right antegrade nephrostogram, if ureter patent remove. Post op wound care per urology   GASTROINTESTINAL A:   Protein calorie malnutrition P:   TF as per nutrition  HEMATOLOGIC A:   Anemia of chronic disease - also has h/o positive stool guaiac.  Hgb was 7.4 on d/c in august. Did get 1 unit of blood during that stay.  S/P unit PRBC's in OR 10/6 and 10/14 P:  Continue darbopoietin No heparin for now, PAS Transfuse for hgb <7.0   INFECTIOUS A:   Empyema bilaterally vs pleurx tube  colonization > present on admission cultures: CRE Klebsiella and MDR  Providencia. CTs removed 10/9 UT sepsis: Klebsiella (ESBL), providencia and pseudomonas (sens to imipenem) Right eye conjunctivitis Sacral decub  P:   Primaxin 10/5>>>10/11 Vanc 10/5 >>>10/9 Flagyl 10/11 (per ID; recommends 14d)  AVYCAZ (per ID) 10/11 (recommends 14d) WOC  Isolation Cipro eye gtt's  ENDOCRINE A:   No acute process P:   Trend am chemistries   NEUROLOGIC A:   Neuromyelitis Optica Left sided hemiparesis Blind P:   RASS goal: 0 Supportive care   Family updated:  Contact person: Kiwanna, [place] Sister [phone]. Marland KitchenUpdated in full on 10/14 and 10/15. Awaiting to see if she can go to IR today. If not till late this afternoon we will cancel and she can have them both done as out-pt.   Interdisciplinary Family Meeting v Palliative Care Meeting: She desires full medical care  at this point, verified w/ sister.   TODAY'S SUMMARY:  Will see if we can get the right antegrade nephrostogram today and d/c right SP cath. Ready to d/c to [facility].   Anders SimmondsPete Babcock ACNP-BC [facility] Pulmonary/Critical Care Pager # [phone] OR # [phone] if no answer     11/19/2013, 8:54 AM    Reviewed above, and examined.  She will go for procedure with IR later this AM.  If stable afterward, will then arrange for transfer back to [facility] this afternoon.  Updated pt's family at bedside.  Coralyn HellingVineet Damichael Hofman, MD [facility] 11/19/2013, 9:47 AM Pager:  [phone] After 3pm call: [phone]

## 2013-11-19 NOTE — Progress Notes (Addendum)
Pt for discharge to [facility].   CSW facilitated pt discharge needs including contacting facility, faxing pt discharge information, discussing with pt sister at bedside, providing RN phone number to call report, and CSW appreciates assistance from unit secretary for arranged ambulance transport via [facility] back to [facility] as pt is vent dependent.  Pt sister at bedside and strong support to pt during transition back to [facility].   No further social work needs identified at this time.   CSW signing off.   Loletta Specter, MSW, LCSW Clinical Social Work [phone]

## 2013-11-21 LAB — TYPE AND SCREEN
ABO/RH(D): A POS
Antibody Screen: NEGATIVE
Unit division: 0
Unit division: 0

## 2013-11-27 ENCOUNTER — Other Ambulatory Visit (HOSPITAL_COMMUNITY): Payer: Self-pay | Admitting: Internal Medicine

## 2013-11-27 DIAGNOSIS — N139 Obstructive and reflux uropathy, unspecified: Secondary | ICD-10-CM

## 2013-11-30 ENCOUNTER — Ambulatory Visit (HOSPITAL_COMMUNITY)
Admission: RE | Admit: 2013-11-30 | Discharge: 2013-11-30 | Disposition: A | Discharge status: Home / Self Care | Payer: Medicare Other | Source: Ambulatory Visit | Attending: Internal Medicine | Admitting: Internal Medicine

## 2013-11-30 VITALS — BP 190/124 | Resp 16

## 2013-11-30 DIAGNOSIS — N139 Obstructive and reflux uropathy, unspecified: Secondary | ICD-10-CM

## 2013-11-30 DIAGNOSIS — Z930 Tracheostomy status: Secondary | ICD-10-CM | POA: Insufficient documentation

## 2013-11-30 DIAGNOSIS — G360 Neuromyelitis optica [Devic]: Secondary | ICD-10-CM | POA: Diagnosis not present

## 2013-11-30 DIAGNOSIS — Z436 Encounter for attention to other artificial openings of urinary tract: Secondary | ICD-10-CM | POA: Diagnosis present

## 2013-11-30 MED ORDER — IOHEXOL 300 MG/ML  SOLN
50.0000 mL | Freq: Once | INTRAMUSCULAR | Status: AC | PRN
  Administered 2013-11-30: 10 mL

## 2013-11-30 NOTE — Progress Notes (Signed)
RT note: Rt transported patient to IR and back to Patient holding area. Monitored patient while in IR. Suctioned moderate thick tan secretions from patients trach. Patient stable at this time. Awaiting Transport team to transport patient back to [facility].

## 2013-11-30 NOTE — Progress Notes (Addendum)
Pt arrived from [facility] via [facility].  Received report from Maretta Los, paramedic.  Stated pt's Bp has been high, last vitals per Justin, 84 NSR, 180/100, 26 and 95% on vent.  #7 Trach, Lt nephrostomy, suprapubic cath, PEG tube.  Respiratory therapist, Fayrene Fearing, connecting pt to vent.  Pt w/ total body edema, skin tight and shiny.  Pt nodded head yes to name and birthday. Pt on Contact precautions for MDRO/MRSA, gowns at bedside.

## 2013-11-30 NOTE — Progress Notes (Signed)
RT note: RT placed patient on vent settings used at [facility]. SIMV VT 500, rate 26, peep 5.0 FIO2 of 100% do to no air to bleed in vent. EVT 492, VE 12.8, total rate 26. PIP 48 mean 8. HR 78 sats 100%. Patient tolerating settings at this time. Bilateral breath sounds clear through out. Patient has a # 7 trach secure and sight clean and dry.

## 2013-11-30 NOTE — Progress Notes (Signed)
Jeananne Rama, PA, in to see pt.  Discussed procedure with pt who nodded head after.

## 2013-12-01 ENCOUNTER — Other Ambulatory Visit (HOSPITAL_COMMUNITY): Payer: Self-pay | Admitting: Internal Medicine

## 2013-12-01 DIAGNOSIS — N139 Obstructive and reflux uropathy, unspecified: Secondary | ICD-10-CM

## 2014-02-05 DEATH — deceased

## 2015-08-09 IMAGING — XA IR NEPHROSTOGRAM*R*
4 series · 9 of 9 positions shown · IV contrast (omnipaque)
Comparison: none

CLINICAL DATA: 63-year-old female with neuromyelitis optic and
bilateral renal stones. She has now undergone bilateral percutaneous
nephrostomy placement and bilateral PCNL procedures. Her creatinine
has normalized. Right-sided nephrostogram to confirm patency of the
right renal collecting system prior to removal of the right
percutaneous nephrostomy tube.

EXAM:
RIGHT NEPHROSTOGRAM
Date: 11/19/2013
PROCEDURE:
1. Right-sided Antegrade nephrostogram through existing nephrostomy
tube
2. Removal of right percutaneous nephrostomy tube
ANESTHESIA/SEDATION:
None required
FLUOROSCOPY TIME:  24 seconds
CONTRAST:  15 mL Omnipaque 300
TECHNIQUE: Informed consent was obtained from the patient following explanation
of the procedure, risks, benefits and alternatives. The patient
understands, agrees and consents for the procedure. All questions
were addressed. A time out was performed.

[Series 2: fl - angio · 4 of 17 frames shown]
[frame 3/17]
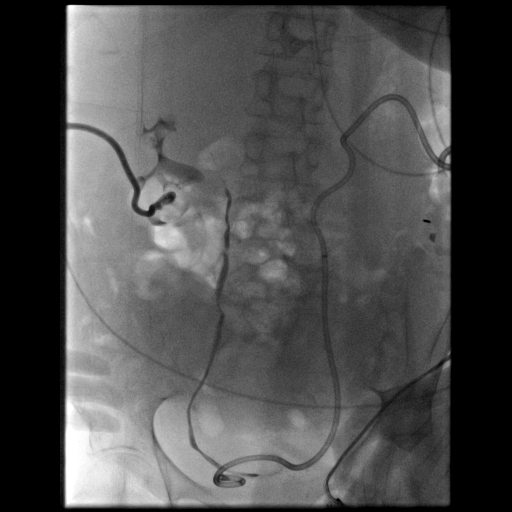
[frame 8/17]
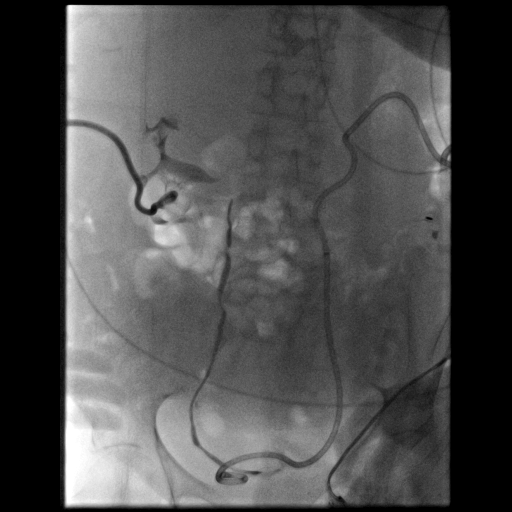
[frame 9/17]
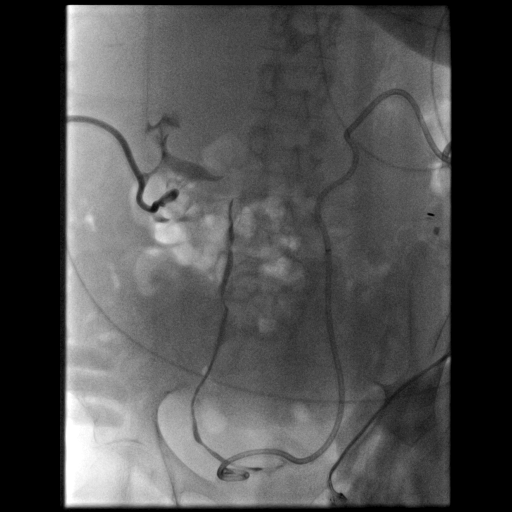
[frame 15/17]
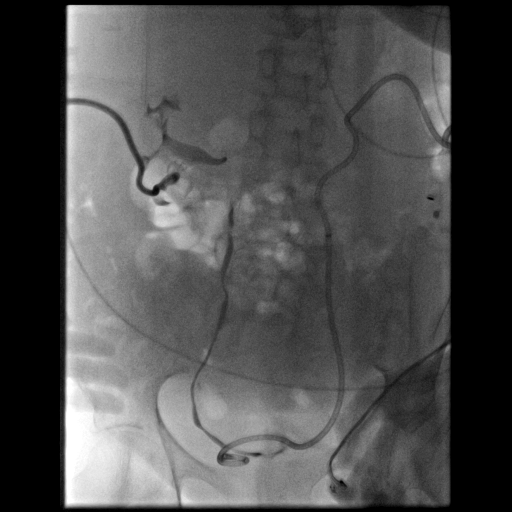

[Series 3: care single · 1 of 1 slices shown (1 of 2)]
[im 1/1]
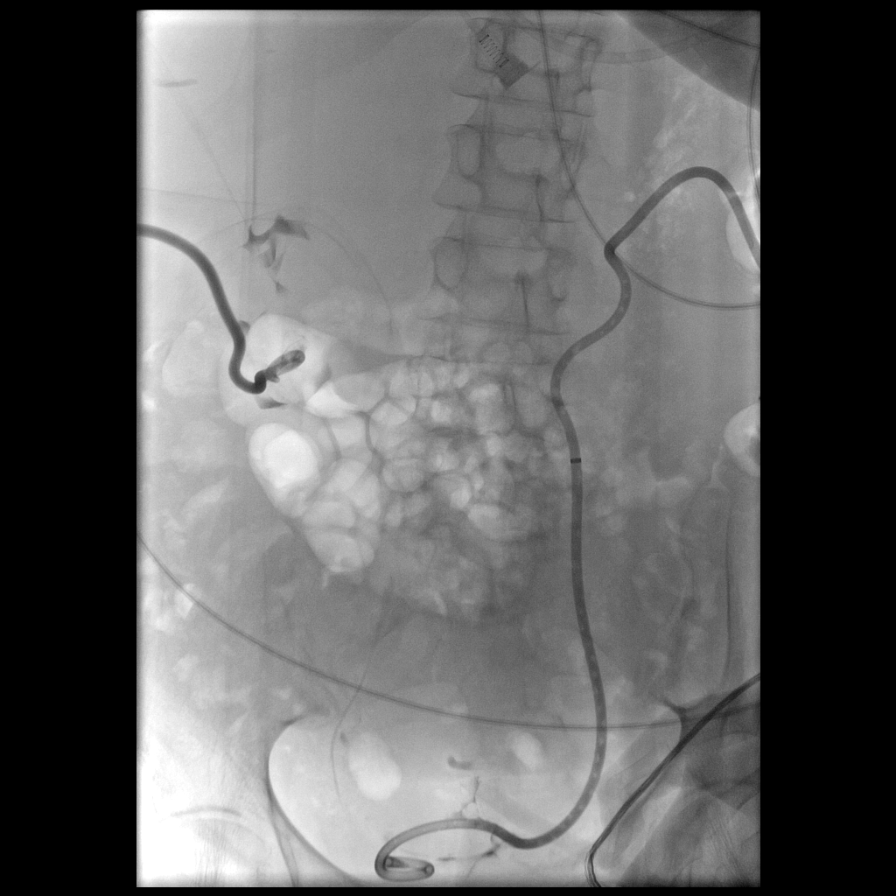

[Series 4: care single · 1 of 1 slices shown (2 of 2)]
[im 1/1]
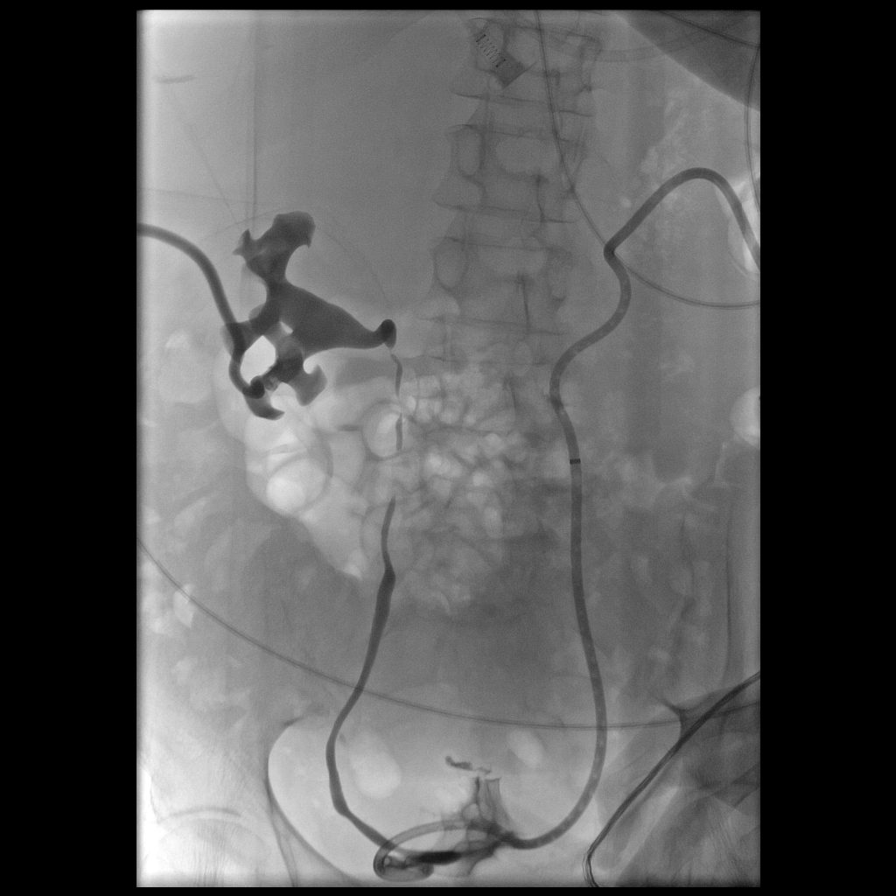

[Series 300: tube placements · 3 of 3 slices shown]
[im 1/3]
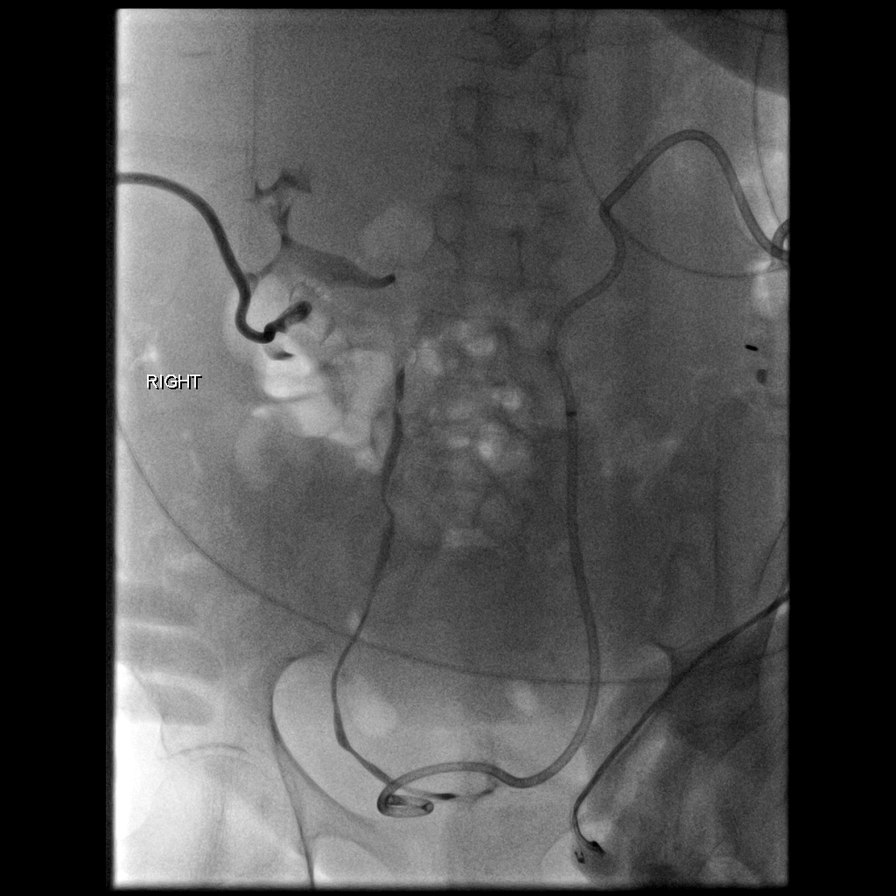
[im 2/3]
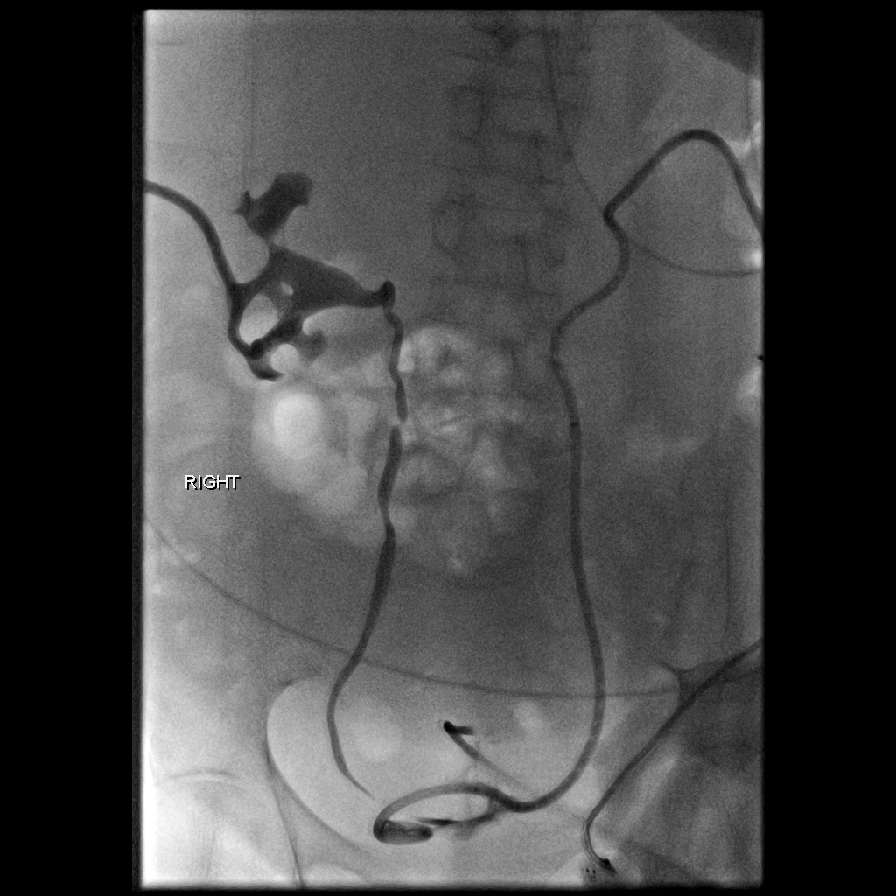
[im 3/3]
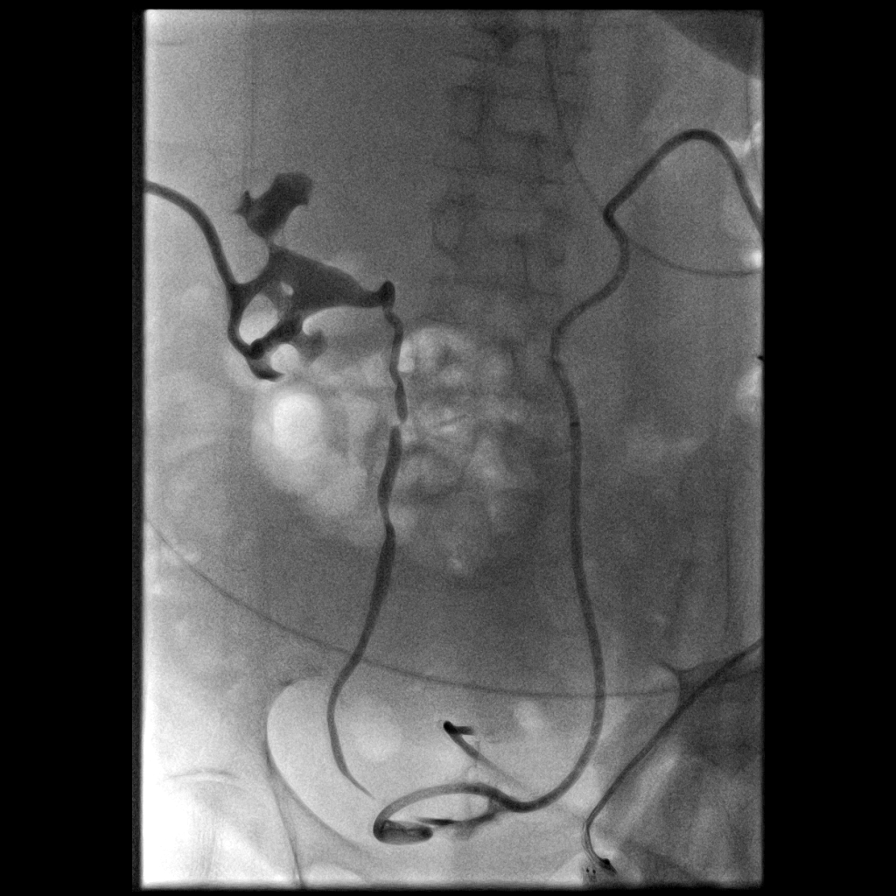

[9 of 9 positions shown; findings below may reference images not displayed]

A gentle hand injection of contrast material through the existing
right percutaneous nephrostomy tube demonstrates that the tube has
pulled back into the lower pole infundibulum. However, there is no
evidence of hydronephrosis and a urine passes freely down the right
ureter and into the bladder.

Therefore, the tube was transected and a gently removed. A bandage
was placed.
IMPRESSION: 1. Right antegrade percutaneous nephrostogram confirms absence of
hydronephrosis and patency of the right renal collecting system.
2. The right percutaneous nephrostomy tube was removed.

## 2023-08-22 ENCOUNTER — Ambulatory Visit: Payer: Self-pay | Admitting: Internal Medicine
